# Patient Record
Sex: Female | Born: 1955
Health system: Southern US, Community
[De-identification: ages and names within clinical notes are randomized; demographics above are authoritative.]

## PROBLEM LIST (undated history)

## (undated) DIAGNOSIS — I219 Acute myocardial infarction, unspecified: Secondary | ICD-10-CM

## (undated) DIAGNOSIS — E785 Hyperlipidemia, unspecified: Secondary | ICD-10-CM

## (undated) DIAGNOSIS — Z9189 Other specified personal risk factors, not elsewhere classified: Secondary | ICD-10-CM

## (undated) DIAGNOSIS — I10 Essential (primary) hypertension: Secondary | ICD-10-CM

## (undated) DIAGNOSIS — B029 Zoster without complications: Secondary | ICD-10-CM

## (undated) DIAGNOSIS — I2510 Atherosclerotic heart disease of native coronary artery without angina pectoris: Secondary | ICD-10-CM

## (undated) DIAGNOSIS — I499 Cardiac arrhythmia, unspecified: Secondary | ICD-10-CM

## (undated) DIAGNOSIS — E079 Disorder of thyroid, unspecified: Secondary | ICD-10-CM

## (undated) DIAGNOSIS — M1990 Unspecified osteoarthritis, unspecified site: Secondary | ICD-10-CM

## (undated) HISTORY — PX: THYROID SURGERY: SHX805

## (undated) HISTORY — PX: BACK SURGERY: SHX140

## (undated) HISTORY — DX: Other specified personal risk factors, not elsewhere classified: Z91.89

## (undated) HISTORY — DX: Hyperlipidemia, unspecified: E78.5

## (undated) HISTORY — PX: TUBAL LIGATION: SHX77

## (undated) HISTORY — PX: CRYOTHERAPY: SHX1416

## (undated) HISTORY — DX: Atherosclerotic heart disease of native coronary artery without angina pectoris: I25.10

## (undated) HISTORY — DX: Zoster without complications: B02.9

## (undated) HISTORY — DX: Cardiac arrhythmia, unspecified: I49.9

---

## 1997-05-01 ENCOUNTER — Ambulatory Visit (HOSPITAL_COMMUNITY): Admission: RE | Admit: 1997-05-01 | Discharge: 1997-05-01 | Payer: Self-pay | Admitting: Neurosurgery

## 1997-05-11 ENCOUNTER — Inpatient Hospital Stay (HOSPITAL_COMMUNITY): Admission: RE | Admit: 1997-05-11 | Discharge: 1997-05-12 | Payer: Self-pay | Admitting: Neurosurgery

## 1997-05-31 ENCOUNTER — Ambulatory Visit (HOSPITAL_COMMUNITY): Admission: RE | Admit: 1997-05-31 | Discharge: 1997-05-31 | Payer: Self-pay | Admitting: Neurosurgery

## 1997-06-19 ENCOUNTER — Ambulatory Visit (HOSPITAL_COMMUNITY): Admission: RE | Admit: 1997-06-19 | Discharge: 1997-06-19 | Payer: Self-pay | Admitting: Neurosurgery

## 1997-06-19 ENCOUNTER — Emergency Department (HOSPITAL_COMMUNITY): Admission: EM | Admit: 1997-06-19 | Discharge: 1997-06-19 | Payer: Self-pay | Admitting: Emergency Medicine

## 1997-06-20 ENCOUNTER — Inpatient Hospital Stay (HOSPITAL_COMMUNITY): Admission: RE | Admit: 1997-06-20 | Discharge: 1997-06-21 | Payer: Self-pay | Admitting: Neurosurgery

## 1997-07-25 ENCOUNTER — Encounter: Admission: RE | Admit: 1997-07-25 | Discharge: 1997-10-23 | Payer: Self-pay | Admitting: Neurosurgery

## 1997-10-17 ENCOUNTER — Ambulatory Visit (HOSPITAL_COMMUNITY): Admission: RE | Admit: 1997-10-17 | Discharge: 1997-10-17 | Payer: Self-pay | Admitting: Rehabilitation

## 1997-11-02 ENCOUNTER — Ambulatory Visit (HOSPITAL_COMMUNITY): Admission: RE | Admit: 1997-11-02 | Discharge: 1997-11-02 | Payer: Self-pay | Admitting: Cardiology

## 1997-11-14 ENCOUNTER — Encounter (HOSPITAL_COMMUNITY): Admission: RE | Admit: 1997-11-14 | Discharge: 1998-02-12 | Payer: Self-pay | Admitting: Cardiology

## 1998-07-12 ENCOUNTER — Ambulatory Visit (HOSPITAL_COMMUNITY): Admission: RE | Admit: 1998-07-12 | Discharge: 1998-07-12 | Payer: Self-pay | Admitting: Cardiology

## 1998-07-16 ENCOUNTER — Encounter: Payer: Self-pay | Admitting: Cardiology

## 1998-07-16 ENCOUNTER — Inpatient Hospital Stay (HOSPITAL_COMMUNITY): Admission: EM | Admit: 1998-07-16 | Discharge: 1998-07-18 | Payer: Self-pay | Admitting: Emergency Medicine

## 1998-07-17 HISTORY — PX: CORONARY ANGIOPLASTY WITH STENT PLACEMENT: SHX49

## 1998-09-04 ENCOUNTER — Ambulatory Visit (HOSPITAL_COMMUNITY): Admission: RE | Admit: 1998-09-04 | Discharge: 1998-09-04 | Payer: Self-pay | Admitting: Cardiology

## 1998-10-11 ENCOUNTER — Ambulatory Visit (HOSPITAL_COMMUNITY): Admission: RE | Admit: 1998-10-11 | Discharge: 1998-10-11 | Payer: Self-pay | Admitting: Cardiology

## 1998-10-11 ENCOUNTER — Encounter: Payer: Self-pay | Admitting: Cardiology

## 1999-01-05 ENCOUNTER — Encounter: Payer: Self-pay | Admitting: Neurosurgery

## 1999-01-05 ENCOUNTER — Ambulatory Visit (HOSPITAL_COMMUNITY): Admission: RE | Admit: 1999-01-05 | Discharge: 1999-01-05 | Payer: Self-pay | Admitting: Neurosurgery

## 1999-02-12 ENCOUNTER — Encounter: Payer: Self-pay | Admitting: Cardiology

## 1999-02-12 ENCOUNTER — Ambulatory Visit (HOSPITAL_COMMUNITY): Admission: RE | Admit: 1999-02-12 | Discharge: 1999-02-12 | Payer: Self-pay | Admitting: Cardiology

## 1999-03-14 ENCOUNTER — Ambulatory Visit (HOSPITAL_COMMUNITY): Admission: RE | Admit: 1999-03-14 | Discharge: 1999-03-14 | Payer: Self-pay | Admitting: Rehabilitation

## 1999-03-17 ENCOUNTER — Encounter: Payer: Self-pay | Admitting: Cardiology

## 1999-03-21 ENCOUNTER — Encounter: Admission: RE | Admit: 1999-03-21 | Discharge: 1999-03-21 | Payer: Self-pay | Admitting: Neurosurgery

## 1999-03-21 ENCOUNTER — Encounter: Payer: Self-pay | Admitting: Neurosurgery

## 1999-04-12 ENCOUNTER — Emergency Department (HOSPITAL_COMMUNITY): Admission: EM | Admit: 1999-04-12 | Discharge: 1999-04-12 | Payer: Self-pay | Admitting: Emergency Medicine

## 1999-09-19 ENCOUNTER — Other Ambulatory Visit: Admission: RE | Admit: 1999-09-19 | Discharge: 1999-09-19 | Payer: Self-pay | Admitting: Obstetrics and Gynecology

## 2000-05-20 ENCOUNTER — Encounter: Admission: RE | Admit: 2000-05-20 | Discharge: 2000-05-20 | Payer: Self-pay | Admitting: Neurosurgery

## 2000-05-20 ENCOUNTER — Encounter: Payer: Self-pay | Admitting: Neurosurgery

## 2000-06-02 ENCOUNTER — Encounter: Admission: RE | Admit: 2000-06-02 | Discharge: 2000-07-01 | Payer: Self-pay | Admitting: Orthopaedic Surgery

## 2000-10-15 ENCOUNTER — Emergency Department (HOSPITAL_COMMUNITY): Admission: EM | Admit: 2000-10-15 | Discharge: 2000-10-15 | Payer: Self-pay | Admitting: Emergency Medicine

## 2000-10-15 ENCOUNTER — Encounter: Payer: Self-pay | Admitting: Emergency Medicine

## 2000-10-27 ENCOUNTER — Encounter: Admission: RE | Admit: 2000-10-27 | Discharge: 2001-01-25 | Payer: Self-pay | Admitting: Occupational Medicine

## 2000-11-26 ENCOUNTER — Encounter: Payer: Self-pay | Admitting: Cardiology

## 2000-11-26 ENCOUNTER — Ambulatory Visit (HOSPITAL_COMMUNITY): Admission: RE | Admit: 2000-11-26 | Discharge: 2000-11-26 | Payer: Self-pay | Admitting: Cardiology

## 2001-05-06 ENCOUNTER — Other Ambulatory Visit: Admission: RE | Admit: 2001-05-06 | Discharge: 2001-05-06 | Payer: Self-pay | Admitting: Obstetrics and Gynecology

## 2001-07-21 ENCOUNTER — Encounter: Payer: Self-pay | Admitting: Cardiology

## 2001-07-21 ENCOUNTER — Ambulatory Visit (HOSPITAL_COMMUNITY): Admission: RE | Admit: 2001-07-21 | Discharge: 2001-07-21 | Payer: Self-pay | Admitting: Cardiology

## 2002-01-12 ENCOUNTER — Encounter: Payer: Self-pay | Admitting: Cardiology

## 2002-01-12 ENCOUNTER — Ambulatory Visit (HOSPITAL_COMMUNITY): Admission: RE | Admit: 2002-01-12 | Discharge: 2002-01-12 | Payer: Self-pay | Admitting: Cardiology

## 2002-01-12 HISTORY — PX: CARDIAC CATHETERIZATION: SHX172

## 2003-04-12 ENCOUNTER — Ambulatory Visit (HOSPITAL_COMMUNITY): Admission: RE | Admit: 2003-04-12 | Discharge: 2003-04-12 | Payer: Self-pay | Admitting: Family Medicine

## 2003-07-26 ENCOUNTER — Encounter (INDEPENDENT_AMBULATORY_CARE_PROVIDER_SITE_OTHER): Payer: Self-pay | Admitting: Cardiology

## 2003-07-26 ENCOUNTER — Ambulatory Visit (HOSPITAL_COMMUNITY): Admission: RE | Admit: 2003-07-26 | Discharge: 2003-07-26 | Payer: Self-pay | Admitting: Cardiology

## 2005-05-18 ENCOUNTER — Encounter (HOSPITAL_COMMUNITY): Admission: RE | Admit: 2005-05-18 | Discharge: 2005-08-16 | Payer: Self-pay | Admitting: Cardiology

## 2006-01-07 ENCOUNTER — Inpatient Hospital Stay (HOSPITAL_COMMUNITY): Admission: EM | Admit: 2006-01-07 | Discharge: 2006-01-10 | Payer: Self-pay | Admitting: Emergency Medicine

## 2006-01-07 HISTORY — PX: CORONARY ANGIOPLASTY WITH STENT PLACEMENT: SHX49

## 2006-01-26 ENCOUNTER — Encounter: Admission: RE | Admit: 2006-01-26 | Discharge: 2006-04-26 | Payer: Self-pay | Admitting: Cardiology

## 2006-03-08 ENCOUNTER — Encounter (HOSPITAL_COMMUNITY): Admission: RE | Admit: 2006-03-08 | Discharge: 2006-06-06 | Payer: Self-pay | Admitting: Cardiology

## 2006-03-12 ENCOUNTER — Ambulatory Visit (HOSPITAL_COMMUNITY): Admission: RE | Admit: 2006-03-12 | Discharge: 2006-03-12 | Payer: Self-pay | Admitting: Cardiology

## 2006-03-18 ENCOUNTER — Ambulatory Visit (HOSPITAL_COMMUNITY): Admission: RE | Admit: 2006-03-18 | Discharge: 2006-03-19 | Payer: Self-pay | Admitting: Cardiology

## 2006-03-18 HISTORY — PX: CORONARY ANGIOPLASTY WITH STENT PLACEMENT: SHX49

## 2006-09-02 ENCOUNTER — Inpatient Hospital Stay (HOSPITAL_COMMUNITY): Admission: EM | Admit: 2006-09-02 | Discharge: 2006-09-03 | Payer: Self-pay | Admitting: Cardiovascular Disease

## 2006-09-02 ENCOUNTER — Encounter: Payer: Self-pay | Admitting: Emergency Medicine

## 2006-09-03 HISTORY — PX: CARDIAC CATHETERIZATION: SHX172

## 2006-09-04 ENCOUNTER — Inpatient Hospital Stay (HOSPITAL_COMMUNITY): Admission: EM | Admit: 2006-09-04 | Discharge: 2006-09-06 | Payer: Self-pay | Admitting: Emergency Medicine

## 2006-09-05 ENCOUNTER — Ambulatory Visit: Payer: Self-pay | Admitting: Vascular Surgery

## 2006-09-05 ENCOUNTER — Encounter (INDEPENDENT_AMBULATORY_CARE_PROVIDER_SITE_OTHER): Payer: Self-pay | Admitting: Cardiology

## 2006-09-09 ENCOUNTER — Ambulatory Visit (HOSPITAL_COMMUNITY): Admission: RE | Admit: 2006-09-09 | Discharge: 2006-09-09 | Payer: Self-pay | Admitting: Cardiology

## 2007-02-16 ENCOUNTER — Emergency Department (HOSPITAL_COMMUNITY): Admission: EM | Admit: 2007-02-16 | Discharge: 2007-02-16 | Payer: Self-pay | Admitting: Family Medicine

## 2008-03-02 ENCOUNTER — Ambulatory Visit (HOSPITAL_COMMUNITY): Admission: RE | Admit: 2008-03-02 | Discharge: 2008-03-02 | Payer: Self-pay | Admitting: Cardiology

## 2008-03-14 ENCOUNTER — Ambulatory Visit (HOSPITAL_COMMUNITY): Admission: RE | Admit: 2008-03-14 | Discharge: 2008-03-14 | Payer: Self-pay | Admitting: Cardiology

## 2009-12-27 ENCOUNTER — Emergency Department (HOSPITAL_COMMUNITY)
Admission: EM | Admit: 2009-12-27 | Discharge: 2009-12-27 | Payer: Self-pay | Source: Home / Self Care | Admitting: Family Medicine

## 2010-03-20 ENCOUNTER — Other Ambulatory Visit (HOSPITAL_COMMUNITY): Payer: Self-pay | Admitting: Endocrinology

## 2010-03-20 DIAGNOSIS — E0590 Thyrotoxicosis, unspecified without thyrotoxic crisis or storm: Secondary | ICD-10-CM

## 2010-04-07 ENCOUNTER — Ambulatory Visit (HOSPITAL_COMMUNITY): Payer: Self-pay

## 2010-04-08 ENCOUNTER — Other Ambulatory Visit (HOSPITAL_COMMUNITY): Payer: Self-pay

## 2010-04-14 ENCOUNTER — Encounter (HOSPITAL_COMMUNITY)
Admission: RE | Admit: 2010-04-14 | Discharge: 2010-04-14 | Disposition: A | Discharge status: Home / Self Care | Payer: 59 | Source: Ambulatory Visit | Attending: Endocrinology | Admitting: Endocrinology

## 2010-04-14 DIAGNOSIS — E0590 Thyrotoxicosis, unspecified without thyrotoxic crisis or storm: Secondary | ICD-10-CM | POA: Insufficient documentation

## 2010-04-15 ENCOUNTER — Encounter (HOSPITAL_COMMUNITY)
Admission: RE | Admit: 2010-04-15 | Discharge: 2010-04-15 | Disposition: A | Discharge status: Home / Self Care | Payer: Commercial Managed Care - PPO | Source: Ambulatory Visit | Attending: Endocrinology | Admitting: Endocrinology

## 2010-04-15 MED ORDER — SODIUM PERTECHNETATE TC 99M INJECTION
10.0000 | Freq: Once | INTRAVENOUS | Status: AC | PRN
  Administered 2010-04-15: 10 via INTRAVENOUS

## 2010-04-15 MED ORDER — SODIUM IODIDE I 131 CAPSULE
9.9000 | Freq: Once | INTRAVENOUS | Status: AC | PRN
  Administered 2010-04-15: 9.9 via ORAL

## 2010-04-22 LAB — BLOOD GAS, ARTERIAL
Acid-Base Excess: 0.5 mmol/L (ref 0.0–2.0)
Drawn by: 242311
FIO2: 0.21 %
TCO2: 25.4 mmol/L (ref 0–100)
pH, Arterial: 7.438 — ABNORMAL HIGH (ref 7.350–7.400)
pO2, Arterial: 65.7 mmHg — ABNORMAL LOW (ref 80.0–100.0)

## 2010-04-28 ENCOUNTER — Other Ambulatory Visit (HOSPITAL_COMMUNITY): Payer: Self-pay | Admitting: Endocrinology

## 2010-04-28 DIAGNOSIS — E0590 Thyrotoxicosis, unspecified without thyrotoxic crisis or storm: Secondary | ICD-10-CM

## 2010-05-09 ENCOUNTER — Encounter (HOSPITAL_COMMUNITY)
Admission: RE | Admit: 2010-05-09 | Discharge: 2010-05-09 | Disposition: A | Discharge status: Home / Self Care | Payer: Commercial Managed Care - PPO | Source: Ambulatory Visit | Attending: Endocrinology | Admitting: Endocrinology

## 2010-05-09 DIAGNOSIS — E0590 Thyrotoxicosis, unspecified without thyrotoxic crisis or storm: Secondary | ICD-10-CM | POA: Insufficient documentation

## 2010-05-09 MED ORDER — SODIUM IODIDE I 131 CAPSULE
28.9000 | Freq: Once | INTRAVENOUS | Status: AC | PRN
  Administered 2010-05-09: 28.9 via ORAL

## 2010-05-20 NOTE — Cardiovascular Report (Signed)
Shelley [name], Shelley [name]:  [id number]   MEDICAL RECORD NO.:  [id number]          PATIENT TYPE:  INP   LOCATION:  6525                         FACILITY:  [facility]   PHYSICIAN:  Madaline Savage, M.D.DATE OF BIRTH:  05/19/1955   DATE OF PROCEDURE:  09/03/2006  DATE OF DISCHARGE:                            CARDIAC CATHETERIZATION   PROCEDURES PERFORMED:  1. Selective coronary angiography by Judkins technique.  2. Retrograde left heart catheterization.  3. Left ventricular angiography.   COMPLICATIONS:  None.   ENTRY SITE:  Right femoral.   DYE USED:  Omnipaque.   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  Prior to hospital entry, she  had some substernal discomfort that was similar to previous chest pains  that she has had and she felt that the severity of the pain was worthy  of going to [facility] Emergency Room.  There she was  transferred to [facility] and this catheterization was performed.   By history, she has had LAD stenting mid.  She has also had some  proximal and mid RCA overlapped stents placed, one by Dr. Peter Swaziland,  and a second one by me.  Today, the patient's cardiac cath was completed  without any complications.   RESULTS:  The left ventricular pressure and central aortic pressure were  normal.  There was no aortic valve gradient by pullback technique.   ANGIOGRAPHIC RESULTS:  The patient has two LAD stents which appear to be  non-overlapped, but very closely adjacent.  The first stent is after  diagonal branch-1, after septal perforator branch-1, but before septal  perforator branch-2.  The second stent is just after septal perforator-  2.  LAD looks pristine throughout including the widely patent stents.  Diagonal is also normal in appearance.  Circumflex bifurcates distally  and is  normal.  Left main coronary artery is normal.   Right coronary artery is large and dominant giving rise to a large  posterior descending branch and a bifurcating posterolateral branch.  There are two overlapped stents in the RCA and the appearance of the  stents there are pristine.  There is a medium to large pulmonary conus  branch of one acute marginal branch.  No lesions seen.   Left ventricular angiography shows vigorous contractility of all wall  segments.  Ejection fraction estimate 60%.  No wall motion  abnormalities.  No LV thrombus.  No mitral regurgitation.   IMPRESSION:  1. Doing well status post coronary artery stenting of left anterior      descending and right coronary artery.  The last intervention was      March 18, 2006.  2. Normal left ventricular systolic function.   PLAN:  The patient is likely having gastrointestinal pain and proton  pump inhibitors are recommended.  She should follow up with the  excellent care Dr. Renard Matter and she should see Dr. Elsie Lincoln either in the  [facility] or [place] office at next available appointment.  ______________________________  Madaline Savage, M.D.     WHG/MEDQ  D:  09/03/2006  T:  09/04/2006  Job:  [id number]   cc:   Angus G. Renard Matter, MD  [facility]

## 2010-05-20 NOTE — Discharge Summary (Signed)
NAMEALVIN, [name]:  [id number]   MEDICAL RECORD NO.:  [id number]          PATIENT TYPE:  INP   LOCATION:  2008                         FACILITY:  [facility]   PHYSICIAN:  Ritta Slot, MD     DATE OF BIRTH:  03/13/1955   DATE OF ADMISSION:  09/04/2006  DATE OF DISCHARGE:  09/06/2006                               DISCHARGE SUMMARY   HOSPITAL COURSE:  [name] is a 55 year old African American  female patient of Dr. Lavonne Chick, who underwent cardiac catheterization  on September 03, 2006.  She had patent coronary arteries at that time.  She  came into the hospital because of a very large hematoma at the groin  site.  She was unable to bear weight or walk.  She was seen by Dr.  Lynnea Ferrier in the emergency room, and then admitted for further  observation.  The following day, she had an ultrasound of her groin  done.  She did have a pseudoaneurysm.  No evidence of AV fistula.  Because of the weekend and the holiday, it will be unable to be  compressed until September 07, 2006.   On September 06, 2006, she was seen by Dr. Allyson Sabal.  Her blood pressure was  111/65.  Her pulse was 74.  The groin area was less tender.  It was felt  that she could go home.  She would have a groin duplex done in our  office in approximately a week's time.  If it is still open at that  time, it will be considered for a thrombin injection by Dr. Yates Decamp.  The patient was agreeable with this.   LABORATORY DATA:  Sodium 138, potassium 3.7, glucose 175, BUN 11,  creatinine 0.81.  Hemoglobin of 14.2, hematocrit 41.8, WBCs 9.3,  platelets 286.   DISCHARGE MEDICATIONS:  1. Glucophage 500 mg.  2. Plavix 75 mg every day.  3. Aspirin 81 mg every day.  4. Lotensin 10 mg every day.  5. Crestor 10 mg every day.  6. Potassium chloride 20 mEq every day.  7. Bystolic 5 mg every day.  8. She was given a prescription for Percocet 5/325.  She can take 1-2      every 6 hours when needed for pain.   DISCHARGE DIAGNOSES:  1. Groin pseudoaneurysm.  2. Coronary artery disease, recent catheterization, September 03, 2006,      at which time she had patent coronary arteries.  She has a history      of stent to her mid left anterior descending, and a Taxusstent to a      proximal right coronary artery lesion in January 2008, and she also      had a Cypherstent to a distal left anterior descending lesion in      March 2008, and a Cypherstent to a mid right coronary artery lesion      in March 2008.  3. Diabetes mellitus.  4. Hypertension.  5. Dyslipidemia.  6. Normal ejection fraction.      [name], N.P.      Ritta Slot, MD  Electronically Signed    BB/MEDQ  D:  09/06/2006  T:  09/06/2006  Job:  [id number]

## 2010-05-20 NOTE — Discharge Summary (Signed)
Shelley [name], Shelley [name]:  [id number]   MEDICAL RECORD NO.:  [id number]          PATIENT TYPE:  INP   LOCATION:  6525                         FACILITY:  [facility]   PHYSICIAN:  Nicki Guadalajara, M.D.     DATE OF BIRTH:  10-06-1955   DATE OF ADMISSION:  09/02/2006  DATE OF DISCHARGE:  09/03/2006                               DISCHARGE SUMMARY   DISCHARGE DIAGNOSES:  1. Chest pain, negative myocardial infarction, patent coronary      arteries and patent stents, most likely gastrointestinal.  2. Diabetes mellitus 2, controlled.  3. Hypertension, controlled.  4. Dyslipidemia.  5. Known known coronary disease with history of myocardial infarction      in January, 2008.  Has history of stents to the left anterior      descending x2 and to the right circumflex artery x2.  Ejection      fraction is normal.   DISCHARGE CONDITION:  Improved.   PROCEDURES:  Combined left heart cath September 03, 2006, by Dr. Madaline Savage.   DISCHARGE MEDICATIONS:  1. Hold Glucophage 500 mg daily until Sunday evening.  2. Plavix 75 mg daily.  3. Aspirin 81 mg daily.  4. Lotensin 10 mg daily.  5. Crestor 10 mg daily.  6. Potassium chloride 20 mEq daily.  7. Bystolic 5 mg daily.  8. Nitroglycerin 1/150 under your tongue as needed for chest pain.  9. Prevacid 30 mg daily; if unable to have Prevacid through [employer], just Prilosec over the counter 1 a day.   DISCHARGE INSTRUCTIONS:  1. Increase activity slowly.  2. No lifting for 2 days.  3. No driving for 2 days.  4. May return to work September 07, 2006, if no problems.  5. Low-sodium, heart-healthy diabetic diet.  6. Wash right groin cath site with soap and water.  Call if any      bleeding, swelling or drainage.  7. Follow up with Dr. Elsie Lincoln on September 14, 2006, at 8:45 a.m.   HISTORY OF PRESENT ILLNESS:  Female transferred from [facility] with  chest pain, has history of coronary disease with intervention to  the RCA  and LAD.  Previously RCA, and I think recently LAD.  But she has had an  MI in January, 2008.   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  She did well during  the day working, went home, was at rest and began to experience fullness  in the midsternal chest region with a sensation of indigestion.  She was  short of breath and nauseated, but no vomiting or diaphoresis.  The pain  did radiate to her left arm with aching.  She denies increased fatigue  and she has been doing well prior to this.  EKG was stable and she was  pain free when she was at rest in the recovery room.  Other history  includes hypertension, dyslipidemia, diabetes.   FAMILY HISTORY/SOCIAL HISTORY/REVIEW OF SYSTEMS/MEDICATIONS:  See H&P.   PHYSICAL EXAMINATION AT DISCHARGE:  GENERAL:  Blood pressure 132/82,  pulse 60, respirations 20, temperature  97.7.  CHEST:  Clear.  HEART:  Regular rate and rhythm.  RIGHT GROIN CATH SITE:  Stable.  No hematoma.  Pedal pulses are present.   LABORATORY DATA:  Cholesterol 126, triglycerides 97, HDL 32 and LDL 75.  Cardiac enzymes:  CK was up initially at 194, but MBs were negative at  3.6.  Troponin-I is 0.01.   Sodium 141, potassium 3.8, chloride 105, CO2 30, glucose 99, BUN 9,  creatinine 0.82.  Calcium 8.9, total protein 6.5, albumin 3.6, SGOT 23,  SGPT 20, alkaline phosphatase 89, total bilirubin 0.6, magnesium 2.0,  INR of 1, PTT 40 with heparin.  HbA1C was 6.3.  EKG is sinus rhythm.  On  admission, sinus bradycardia, T-wave abnormality laterally and the same  wave as the 29th.  A similar EKG on April 18, 2006.   HOSPITAL COURSE:  The patient was admitted to Dr. Tresa Endo secondary to  chest pain.  She was put on IV heparin, IV nitroglycerin.  The morning  of August 29, she underwent cardiac catheterization that revealed patent  coronaries and patent stents, 4 previous stents.  She was found to be  stable, no more pain; felt to be GI.  We put her on Prevacid,  and if her  insurance company will not fill that, she can use Prilosec over the  counter.  She was transferred to 6500 for observation until she can  recover from the cardiac cath.  She was stable at discharge and will  follow up with Dr. Elsie Lincoln.      Darcella Gasman. [name], N.P.    ______________________________  Nicki Guadalajara, M.D.    LRI/MEDQ  D:  09/03/2006  T:  09/04/2006  Job:  [id number]   cc:   Madaline Savage, M.D.  Dr. Megan Mans

## 2010-05-20 NOTE — H&P (Signed)
NAMEINEZ, STANTZ NO.:  [id number]   MEDICAL RECORD NO.:  [id number]          PATIENT TYPE:  INP   LOCATION:  2008                         FACILITY:  [facility]   PHYSICIAN:  Ritta Slot, MD     DATE OF BIRTH:  03-11-1955   DATE OF ADMISSION:  09/04/2006  DATE OF DISCHARGE:                              HISTORY & PHYSICAL   Ms. Rhoda Waldvogel is a 55 year old lady well known to my partner,  Lavonne Chick, who is status post cardiac catheterization September 03, 2006  which revealed patent coronary arteries.  She noticed increased swelling  and pain of right groin today and came to the emergency room.  She was  found by myself to have a very large tender hematoma at the calf site;  she is not able to weight bear or walk.  She is currently taking aspirin  and Plavix, and she has had three drug-eluting stents inserted in the  past.  She denies any other complaints of chest pain or shortness of  breath.   PAST MEDICAL HISTORY:  1. Stent to her mid-LAD in 2000.  2. Taxus stent to a proximal RCA lesion of 3.0 x 24 mm in January of      2008.  3. CYPHER stent to a distal LAD lesion March of 2008; CYPHER stent to      a mid-RCA lesion March of 2008.  4. Diabetes.  5. Hypertension.  6. Dyslipidemia.  7. Normal EF.   CURRENT MEDICATIONS:  Include:  1. Plavix 75 mg daily.  2. Aspirin 81 mg daily.  3. Lotensin 10 mg daily.  4. Crestor 10 mg daily.  5. Potassium chloride 20 mEq.  6. Bystolic 5 mg daily.  7. Prevacid 30 mg daily.   She has no known drug allergies.   SOCIAL HISTORY:  She works as an Scientist, physiological at [employer].  She  is an ex-smoker, quit January of 2008.  She does not drink alcohol.  She  lives with her husband and son.   FAMILY HISTORY:  Noncontributory.   REVIEW OF SYSTEMS:  Otherwise unremarkable.   PHYSICAL EXAMINATION:  GENERAL:  She is a well-looking individual in no  acute distress.  VITAL SIGNS:  Blood pressure is 109/72, heart  rate 72 sinus rhythm,  respiratory rate 16.  EXAMINATION OF CARDIOVASCULAR SYSTEM:  Heart sounds 1 and 2 are heard.  No murmurs, heaves or thrills noted.  LUNGS:  Lung fields clear to auscultation bilaterally.  ABDOMEN:  Soft, nontender.  No hepatosplenomegaly.  EXTREMITIES:  Pedal pulses were present 2+ bilaterally and there is no  evidence of vascular compromise to her lower extremity.  There was a  large huge hematoma in her groin that was tender and bruised.   IMPRESSION:  A pleasant 55 year old lady with significant groin hematoma  status post cardiac catheterization.   PLAN:  Will perform an abdomen and pelvis CT without contrast and CT of  the right groin looking specifically for retroperitoneal bleed. Will  also perform a vascular ultrasound scan with Dopplers to the right groin  in the morning to exclude AV fistula +/-  aneurysm.  I would recommend  compression to her right groin, elevate the right leg and a CBC and B-  Met is pending.   Many thanks.      Ritta Slot, MD  Electronically Signed     HS/MEDQ  D:  09/04/2006  T:  09/05/2006  Job:  [id number]

## 2010-05-23 NOTE — H&P (Signed)
NAMENASIRA, JANUSZ NO.:  [id number]   MEDICAL RECORD NO.:  [id number]          PATIENT TYPE:  INP   LOCATION:  2903                         FACILITY:  [facility]   PHYSICIAN:  Peter M. Swaziland, M.D.  DATE OF BIRTH:  September 30, 1955   DATE OF ADMISSION:  01/07/2006  DATE OF DISCHARGE:                              HISTORY & PHYSICAL   HISTORY OF PRESENT ILLNESS:  Ms. Pint is a 55 year old black  female who presents with an acute onset of perfuse sweating at 8 p.m.  tonight while bowling.  She then developed 6/10 chest pain across her  anterior precordium, associated with nausea.  She felt lightheaded and  weak.  EMS was called and initial ECG showed fairly marked ST-elevation  inferiorly at 4 mm reciprocal ST-depression in leads 1 and AVL.  She has  ongoing chest pain on arrival to the emergency room.  The patient does  have a history of coronary disease and is status post stenting of the  proximal LAD in 2000.  She has a history of hypertension,  hypercholesterolemia and tobacco abuse.   PAST MEDICAL HISTORY:  1. Prior stenting of the left anterior descending in 2000.  2. Hypertension.  3. Hypercholesterolemia.  4. Prior back surgery.   ALLERGIES:  SHE HAS BEEN INTOLERANT TO NORVASC DUE TO TACHYCARDIA.   MEDICATIONS:  Include Maxzide, Tiazac and aspirin.  She has previously  been on Vytorin, but states she is no longer taking this.   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  She is a smoker.  She is married.   FAMILY HISTORY:  Unobtainable at the time.   PHYSICAL EXAMINATION:  GENERAL:  Patient is a well-developed black  female in moderate distress.  VITAL SIGNS:  Her blood pressure is 130/70.  Pulse 65.  Respirations are  normal.  HEENT:  Exam is unremarkable.  She is normocephalic, atraumatic.  Pupils  are equal, round and reactive.  Sclerae are clear.  Oropharynx is clear.  She has no JVD or bruits.  LUNGS:   Clear to auscultation and percussion.  CARDIAC EXAM:  Reveals a regular rate and rhythm with a positive S4.  There is no S3 or murmur.  ABDOMEN:  Soft and nontender without masses or bruits.  EXTREMITIES:  Femoral pedal pulses are 2+ and symmetric.  She has no  edema.  NEUROLOGIC EXAM:  Intact.   LABORATORY DATA:  Still pending.   IMPRESSION:  1. Acute inferior myocardial infarction.  2. Prior stenting of the left anterior descending.  3. Hypertension.  4. Hypercholesterolemia.  5. Tobacco abuse.   PLAN:  Patient is loaded with aspirin.  She will be taken for emergent  cardiac catheterization and coronary intervention.  Further plans  pending these results.           ______________________________  Peter M. Swaziland, M.D.     PMJ/MEDQ  D:  01/07/2006  T:  01/08/2006  Job:  [id number]   cc:   Madaline Savage, M.D.

## 2010-05-23 NOTE — Discharge Summary (Signed)
NAMESHARDEA, [name]:  Shelley [name]   MEDICAL RECORD NO.:  Shelley [name]          PATIENT TYPE:  INP   LOCATION:  Shelley [name]                         FACILITY:  Shelley [name]   PHYSICIAN:  Shelley [name], M.D.DATE OF BIRTH:  [birth year]   DATE OF ADMISSION:  01/07/2006  DATE OF DISCHARGE:  01/10/2006                               DISCHARGE SUMMARY   DISCHARGE DIAGNOSES:  1. Acute inferior myocardial infarction.  2. Coronary artery disease with an emergent cardiac catheterization      leading to rescue percutaneous transluminal coronary angiography      and stent of the right coronary artery with a drug-eluting Taxus      stent.  3. Nonsustained ventricular tachycardia during the catheterization      spontaneously terminated.  4. Previous history of left anterior descending stent in 2000.  5. Hypertension.  6. Hyperlipidemia.  7. Diabetes mellitus type 2 new diagnosis with a glycohemoglobin of      6.8.  Glucophage was added to her medical regimen.  8. Tobacco abuse.  Instructed to stop.  9. Hyperlipidemia.   DISCHARGE CONDITION:  Improved.   PROCEDURES:  Emergent cardiac catheterization January 07, 2006, by Dr.  Peter [name] with PTCA and stent deployment to the right coronary  proximally.  She had good LV function on that catheterization.   DISCHARGE MEDICATIONS:  1. Enteric-coated aspirin 81 mg daily.  2. Plavix 75 mg daily, do not stop.  3. Vytorin 10/80 one daily.  4. K-Dur 20 mEq daily.  5. Chantix pack, take as directed.  6. Metoprolol 50 mg 1 twice a day.  7. Lotensin/hydrochlorothiazide 10/12.5 one daily.  8. Glucophage 500 mg daily.  9. Nitroglycerin sublingual as needed for chest pain.  10.Stop Maxzide, Tiazac, and Toprol XL.   DISCHARGE INSTRUCTIONS:  1. No work until after seeing Dr. Elsie [name] in office.  2. Low-fat, low-salt diabetic diet.  3. No driving for two weeks.  4. No lifting for two weeks.  5. Increase activity slowly.  6. Follow up with  Dr. Elsie [name].  The office will call.  7. Follow up with Dr. Renard [name].  8. Have lab work done Wednesday and Thursday with Dr. Renard [name] send      prescription slip.  Dr. Renard [name] should manage her diabetes please      and ask him to set up a class to teach you how to use your Accu-      Chek machine.  Notify him if glycohemoglobin 6.8 with hypoglycemia.   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 on January 07, 2006.  She was bowling at the time and developed 6 out of 10 pain, anterior  precordial disease, and nausea.  EMS was called.  Initial EKG showed Q  waves, ST elevation inferiorly, ongoing chest pain presented to the ER.  She does have a prior history of stent to the LAD in 2000.   PAST MEDICAL HISTORY:  1. Coronary artery disease as stated.  2. Hypertension.  3. Hyperlipidemia.  4. Positive tobacco history.  5. Prior back surgery.   ALLERGIES:  INTOLERANT TO NORVASC WHICH HAS CAUSED TACHYCARDIA.   OUTPATIENT MEDICATIONS:  Maxzide, Tiazac, and aspirin.  Previously had  been on Vytorin but she had stopped.   SOCIAL HISTORY/FAMILY HISTORY/REVIEW OF SYSTEMS:  See history and  physical.   PHYSICAL EXAMINATION AT DISCHARGE:  VITAL SIGNS:  Blood pressure 100/63  to 134/88, pulse 63, respiratory 18, temperature 97.4, oxygen saturation  on room air 92%.  HEART:  Regular rate and rhythm.  LUNGS:  Clear.  ABDOMEN:  Soft, nontender, positive bowel sounds.  EXTREMITIES:  No edema.   LABORATORY DATA:  Hemoglobin 16.1, hematocrit 46.7, WBC 9.4, and  platelets 313; neutrophils 53, lymphocytes 33, monocytes 10, eosinophils  3, basophils 1, and these remained stable.  PT on admission 12.9, INR of  1, PTT 27.   Sodium 138, potassium 3.3, this was replaced, and at discharge he was  4.1, chloride CO2 27, glucose 187, BUN 11, creatinine  0.89.  At  discharge, glucose 115, that was on sliding scale.  Cardiac enzymes  ranged 195 with an MB of 4.9 to 16 with an MB of 16.8 and 197 with an MB  of 12.4.  Troponin-I was 0.03, 0.94, and 1.01.   Total cholesterol 248, HDL 29, LDL 178.  TSH 0.515.   EKGs:  Initial EKG with ST elevation in lead 3 and AVF and inverted T  waves in V3, V4, and V5.  Followup on the 4th, Q waves developed in lead  3, and deeper T waves in V3 and V4 and V5, and the ST elevation was gone  from V3 and aVF.   Chest x-ray:  Chronic basilar scarring.  It is not possible to rule out  coexisting acute process such as basilar pneumonia.  No heart failure.   Cardiac catheterization:  Left main was patent.  LAD stent proximal  okay, 70% mid stenosis in the mid vessel.  Circumflex single large, OM  no significant disease, RCA 100% proximal occluded with left-to-right  collaterals.  The patient then under PCI with Taxus stent placed to the  RCA by Dr. Peter [name] who was on call for Interventional Cardiology  for [place].   The patient tolerated the procedure.  She did have ventricular  tachycardia, but it resolved spontaneously.  Her potassium was low, and  that has been replaced as well.   HOSPITAL COURSE:  Ms.  Shelley [name] did very well after the initial  presentation once the vessel was opened.  She had no further complaints.  She improved daily and was able to ambulate without problems.  Her sugar  was elevated in the hospital.  We put her on glipizide since her  glycohemoglobin was elevated and discussed diabetic instructions to a  point.  She will follow up that with her primary care physician.  She  also underwent tobacco cessation and was placed on Chantix.  She  __________  cardiac rehabilitation, and by January 10, 2006, she was  ready for discharge home.  Was seen and discharged by Dr. Domingo Sep.      Shelley [name]. [name], N.P.   ______________________________  Shelley [name], M.D.    LRI/MEDQ   D:  03/09/2006  T:  03/10/2006  Job:  [id number]   cc:   Shelley [name], M.D.  Shelley M. [name], M.D.  Angus G. Shelley Matter, MD

## 2010-05-23 NOTE — H&P (Signed)
NAME:  Shelley [name], Shelley [name]:  [id number]   MEDICAL RECORD NO.:  [id number]                   PATIENT TYPE:  OIB   LOCATION:  2854                                 FACILITY:  [facility]   PHYSICIAN:  Madaline Savage, M.D.             DATE OF BIRTH:  1955/07/12   DATE OF ADMISSION:  01/12/2002  DATE OF DISCHARGE:                                HISTORY & PHYSICAL   CHIEF COMPLAINT:  Chest pain.   HISTORY OF PRESENT ILLNESS:  The patient is a 55 year old white female  followed by Dr. Madaline Savage and Dr. Ishmael Holter. Mc[place], with a history  of coronary disease.  She had a stent to the mid LAD in July of 2000.  Her  last office visit with Dr. Elsie Lincoln was in July of 2003.  She was set up for a  Cardiolite study, which showed a questionable area of anterior wall ischemia  versus breast attenuation, with an EF of 72%.  Recently, she has had some  recurrent chest pain.  She called the office last week.  She was put on  Imdur and Plavix by Dr. Cristy Hilts. Ganji and set up for a diagnostic  catheterization; she is admitted now for this.   PAST MEDICAL HISTORY:  Her past medical history is remarkable for  hypertension, hyperlipidemia and coronary disease as described above.   CURRENT MEDICATIONS:  1. Maxzide daily.  2. Celebrex 200 mg a day.  3. Aspirin 325 mg a day.  4. Plavix 75 mg a day.  5. Celexa 20 mg a day.  6. Lipitor 20 mg a day.  7. Imdur 30 mg a day.   She had been on Diovan in the past but is not taking this now.   ALLERGIES:  She has no known drug allergies.  She has a history of ACE  INHIBITOR intolerance because of cough.   SOCIAL HISTORY:  She is married and has one child.  She smokes a half a pack  a day.  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   FAMILY HISTORY:  Unremarkable.   PHYSICAL EXAMINATION:  VITAL SIGNS:  Blood pressure 137/82, pulse 68,  temperature 97.7.  GENERAL:  She is a well-developed, well-nourished African  American in no  acute distress.  HEENT:  Normocephalic.  Extraocular movements are intact.  Sclerae are  nonicteric.  Lids and conjunctivae are within normal limits.  NECK:  Neck is without JVD and without bruit.  CHEST:  Chest is clear to auscultation and percussion.  CARDIAC:  Exam reveals a regular rate and rhythm without murmur, rub or  gallop, normal S1 and S2.  ABDOMEN:  Abdomen is nontender.  No hepatosplenomegaly.  EXTREMITIES:  Extremities are without edema.  Distal pulses are intact.  NEUROLOGIC:  Exam is grossly intact.   LABORATORY DATA:  Labs done January 09, 2002 show sodium 138, potassium 4.1,  BUN 8, creatinine 0.8.  Her alkaline phosphatase is slightly elevated at 152  but liver functions otherwise are normal.  Urinalysis showed some leukocyte  esterase and a few bacteria.  White count was 8.5, hemoglobin 14.5,  hematocrit 42.4, platelets 279,000.  INR 1.1.   EKG reveals sinus rhythm, inferior T wave inversion and T wave inversion, V2  to V5.   IMPRESSION:  1. Chest pain consistent with angina.  2. Coronary disease with previous left anterior descending stenting in July     of 2000.  3. Treated hypertension.  4. Treated hyperlipidemia.  5. Moderate amount of leukocyte esterase, no symptoms of urinary tract     infection.   PLAN:  The patient will be set up for a diagnostic catheterization, which  will be done by Dr. Darlin Priestly, January 12, 2002.     Abelino Derrick, P.A.                      Madaline Savage, M.D.    Lenard Lance  D:  01/12/2002  T:  01/12/2002  Job:  [id number]

## 2010-05-23 NOTE — Cardiovascular Report (Signed)
NAMEKATESHA, EICHEL          ACCOUNT NO.:  [id number]   MEDICAL RECORD NO.:  [id number]          PATIENT TYPE:  OIB   LOCATION:  6525                         FACILITY:  [facility]   PHYSICIAN:  Madaline Savage, M.D.DATE OF BIRTH:  [birth year]   DATE OF PROCEDURE:  03/18/2006  DATE OF DISCHARGE:  03/19/2006                            CARDIAC CATHETERIZATION   PROCEDURES PERFORMED:  1. Selective coronary angiography by Judkins technique.  2. Retrograde left heart catheterization.  3. Left ventricular angiography.  4. Balloon angioplasty of the mid-left anterior descending coronary      artery followed by coronary artery stenting of the same area.  5. Intravascular ultrasound of the right coronary artery with then      direct stenting of the mid RCA distal to a previously placed      proximal RCA stent.   COMPLICATIONS:  None.   PATIENT PROFILE:  Laiba is a delightful, 55 year old, African  American, married female who is a [employer] employee.  She has  had previous LAD stenting in the year 2000, and had acute myocardial  infarction on January 07, 2006, at which time Dr. Peter Swaziland opened an  occluded proximal RCA and placed a TAXUS stent there 3.0 x 24.   The patient has had occasional episodes of arm pain that are not severe  but do concern her.  She was recently diagnosed with diabetes and is  treated for hyperlipidemia.  A stress test was performed on March 08, 2006, and showed a small but present area of reversible ischemia to the  anteroapical wall.  Because of this, we came to the cath lab today to  investigate the issues further.   RESULTS:   PRESSURES:  Left ventricular pressure was 120/5, end-diastolic pressure  2, central aortic pressure 120/70, mean of 90, no aortic valve gradient  by pullback technique.   ANGIOGRAPHIC RESULTS:  The patient's left main coronary artery was  normal.   The LAD coursed to the cardiac apex and gives rise to three small  to  medium size septal perforator branches.  There are at least two diagonal  branches present.  There is a radio-opaque stent overlying septal  perforator branch #1, and adjacent to first diagonal branch.  It was  deployed in the year 2000, and it would appear to be normal in terms of  its silhouette in two views.  There is a new area of interest in the  LAD, discovered in January of this year, when Dr. Swaziland was intervening  on her RCA.  This vessel has a tapered smooth stenosis of 75% visually,  and then the distal vessel becomes normal again.   The circumflex coronary artery is a large vessel, giving rise to one  large distal bifurcating obtuse marginal branch.  No significant lesions  are seen in this vessel.  There are some luminal irregularities in the  mid portion of the vessel.   RCA is a large and dominant vessel which is the dominant vessel of the  circulation.  There is a radio-opaque stent in the proximal portion of  the vessel, extending down to  the origin of the mid RCA and it is  pristine in appearance and has TIMI 3 flow.  There is irregular hazy  anatomy in the mid RCA just after the widely patent stent and just  before the acute angle of the heart.  The later IVUS performed showed a  reference vessel distally of close to 3.25 to 3.5-mm with very, very  mild plaquing.  The region of interest in the mid RCA showed eccentric  plaque at 5 o'clock that was very soft without calcification and  obstructive to only about 25 to 40% of luminal narrowing.  However, in  this soft plaque mid RCA showed an area of 75-80% stenosis in the left  upper quadrant of the IVUS picture, and it was therefore chosen to go  ahead and perform stenting.   The left ventricle showed normal contractility of all wall segments with  ejection fraction of 60%.   Percutaneous stenting on the mid-LAD was performed with a 2.5 x 13-mm  CYPHER stent which was post dilated with a 3-mm Quantum Maverick  balloon  and taken up to approximately 16 or 17 atmospheres of pressure with an  excellent and pristine result 75% to zero.   Likewise, a RCA stent was placed directly without pre dilatation into  the mid RCA just beyond the already patent proximal RCA stent already  indwelling which is a Taxus stent.  Balloon inflations were performed  and post dilated with a Quantum Maverick balloon with a pristine looking  result and preservation of TIMI 3 flow.   FINAL DIAGNOSES:  1. Two-vessel coronary artery disease as described above.      a.     Patent left anterior descending artery stent.      b.     A 75% de novo stenosis distal to septal perforator branch       number two, 75%.  2. Right coronary artery disease with patent stent in proximal right      coronary artery deployed January 05, 2006, and a new CYPHER stent      today 3.0 x 23, reducing a 75% stenosis to 0% residual.  Post      dilated with a Quantum Maverick balloon.  3. Normal left ventricular systolic function.   PLAN:  The patient will be observed.   It should be noted the patient also underwent right femoral artery hand  angiography and was thereafter, a recipient for an Angio-Seal closure  device which worked Agricultural consultant.           ______________________________  Madaline Savage, M.D.     WHG/MEDQ  D:  03/18/2006  T:  03/19/2006  Job:  [id number]   cc:   Cath Lab, [facility]

## 2010-05-23 NOTE — Cardiovascular Report (Signed)
NAMEDISHA, COTTAM NO.:  [id number]   MEDICAL RECORD NO.:  [id number]          PATIENT TYPE:  INP   LOCATION:  2807                         FACILITY:  [facility]   PHYSICIAN:  Peter M. Swaziland, M.D.  DATE OF BIRTH:  [birth year]   DATE OF PROCEDURE:  01/07/2006  DATE OF DISCHARGE:                            CARDIAC CATHETERIZATION   INDICATION FOR PROCEDURE:  Ms. Torti is a 55 year old black female  who has a known history of coronary disease, status post prior stenting  of the left anterior descending artery in 2000.  She presents now with  acute inferior myocardial infarction.  Her pain began approximately 40  minutes prior to arrival.  She is having ongoing chest pain and ST  elevation inferiorly.   PROCEDURES:  1. Left heart catheterization.  2. Coronary and left ventricular angiography.   EQUIPMENT USED:  The 6-French 4-cm right and left Judkins catheters, 6-  French pigtail catheter, 6-French arterial sheath, 6-French FR-4 guide,  0.014 high-torque floppy wire, a 2.5 x 15-mm Maverick balloon, a 3.0 x  24-mm Taxus stent and a 3.25 x 20-mm Quantum balloon.   CONTRAST:  165 mL of Omnipaque.   MEDICATIONS:  Heparin 4400 units IV with subsequent ACT of 310,  Integrilin bolus at 180 mcg/kg followed by a continuous infusion at 2.0  mcg/kg per minute, Zofran 4 mg IV, Plavix 300 mg p.o., Pepcid 20 mg IV.  Actually, the patient did receive a double bolus of Integrilin at 180  mcg/kg.   HEMODYNAMIC DATA:  Aortic pressure was 139/83 with a mean of 108.  Left  ventricle pressure was 135 with EDP of 28.  The patient is a sinus  rhythm.   ANGIOGRAPHIC DATA:  The left coronary artery arises and distributes  normally.  The left main coronary appears normal.   The left anterior descending artery demonstrates a stent in the proximal  vessel.  This is widely patent.  She then has a focal 70% stenosis in  the mid LAD following the first diagonal branch.  The  remainder of the  LAD is without significant disease.   The left circumflex coronary artery is a large vessel which gives rise  to a single large obtuse marginal vessel.  There are minimal  irregularities proximally.  This is less than 10%.   The right coronary artery is a dominant vessel and is occluded  proximally.  There are left-to-right collaterals to the distal right  coronary artery.   We proceeded at this point with acute intervention of the right coronary  artery occlusion.  After initial anticoagulation, the lesion was crossed  easily with a wire with reperfusion.  We then performed 2 balloon  inflations using the 2.59-mm Maverick up to 8 atmospheres x2.  This  resulted in improve reperfusion.  At this time, the patient did develop  bradycardia and hypotension and developed a nonsustained run of  ventricular tachycardia that broke spontaneously.  Subsequently, the  patient responded to IV fluids and subsequently remained hemodynamically  stable throughout procedure.  We next followed with stenting of the  proximal right coronary using a 3.0 x 24-mm  Taxus stent; this was  deployed at 9 atmospheres, then postdilated to 14 atmospheres.  We then  postdilated the entire stent using a 3.25 x 20-mm Quantum Maverick  balloon dilating to 12 atmospheres x2.  This yielded an excellent  angiographic result with 0% residual stenosis and TIMI grade 3 flow.  There was mild disease in the mid right coronary up to 20% to 30%.  At  this point, the patient had grade 1-2/10 chest pain and ST segment  elevation had resolved.   We then proceeded with the left ventricular angiography.  This was  performed in the RAO view.  This demonstrates normal left ventricular  size with minimal inferior wall hypokinesia and overall ejection  fraction of 55%.   At the end of procedure, the patients sheath was removed and the femoral  puncture site was closed using an Angio-Seal device with excellent   hemostasis.   FINAL INTERPRETATION:  1. Two-vessel obstructive coronary artery disease with acute occlusion      of the proximal right coronary artery.  2. Continued patency of the stent in the proximal left anterior      descending.  3. Good left ventricular function.  4. Successful intracoronary stenting of the proximal right coronary.           ______________________________  Peter M. Swaziland, M.D.     PMJ/MEDQ  D:  01/07/2006  T:  01/08/2006  Job:  [id number]   cc:   Madaline Savage, M.D.

## 2010-05-23 NOTE — Cardiovascular Report (Signed)
NAME:  Shelley [name], Shelley [name]                    ACCOUNT NO.:  [id number]   MEDICAL RECORD NO.:  [id number]                   PATIENT TYPE:  OIB   LOCATION:  2854                                 FACILITY:  [facility]   PHYSICIAN:  Darlin Priestly, M.D.             DATE OF BIRTH:  1955/05/06   DATE OF PROCEDURE:  01/12/2002  DATE OF DISCHARGE:                              CARDIAC CATHETERIZATION   PROCEDURES:  1. Left heart catheterization.  2. Coronary angiography.  3. Left ventriculogram.  4. Abdominal aortogram.   COMPLICATIONS:  None.   INDICATIONS:  The patient is a 55 year old female, patient of Dr. Butch Penny and Dr. Lavonne Chick, with a history of PTCA and stenting of the LAD  in July of 2000.  The patient also has a history of tobacco use.  She has  been having intermittent substernal chest pain, which is nitroglycerin  responsive.  She is now referred for repeat catheterization.  Her last  Cardiolite scan in July of 2003 revealed possible anterior wall ischemia,  thought it is difficult to determine secondary to breast attenuation.   DESCRIPTION OF PROCEDURE:  After given informed written consent, the patient  was brought to the cardiac catheterization lab where her right and left  groins were shaved, prepped, and draped in the usual sterile fashion.  ECG  monitoring was established.  Using modified Seldinger technique, a #6 French  arterial sheath inserted in the right femoral artery.  A 6 French diagnostic  catheters were used to perform diagnostic angiography.  This reveals a large  left main with no significant disease.  The LAD is a large vessel which  coursed to the apex and is one diagonal branch. There is mild 30% proximal  narrowing.  There is a stent noted in the proximal LAD with no evidence of  in-stent re-stenosis.  There is 40% mid LAD stenosis.  There is one medium  sized diagonal branch which bifurcates distally and arises within the  previously stented  LAD segment.  There appears to be approximately 70%  ostial narrowing.  This is unchanged from July of 2000.   The left circumflex is a large vessel which courses in the AV groove and  gave rise to one obtuse marginal branch.  The AV groove has mild 20%  proximal narrowing.  The rest of the A-V groove circumflex has no  significant disease.  The first OM is a large vessel which bifurcates in the  mid segment and has no significant disease.   The right coronary artery is a large vessel which is dominant and it gives  rise to both PDA as well as the posterolateral branch.  The RCA is noted to  have 30% early mid narrowing.  There is course irregularities throughout the  remainder of the RCA but no high-grade stenosis.  The PDA and posterolateral  branch have no significant disease.   LEFT VENTRICULOGRAM:  The left ventriculogram  reveals preserved EF of 50%.   ABDOMINAL AORTOGRAPHY:  Abdominal aortogram reveals no evidence of renal  artery stenosis.   HEMODYNAMICS:  Systemic arterial pressure 140/82, LV systemic pressure  150/3, LVEDP of 10.    CONCLUSIONS:  1. One-vessel coronary artery disease involving the small diagonal branch,     which arises within the previously stented left anterior descending     segment which is essentially unchanged from July of 2000.  2. Normal left ventricular systolic function.  3. No evidence of renal artery stenosis.  4. Systemic hypertension.                                                   Darlin Priestly, M.D.    RHM/MEDQ  D:  01/12/2002  T:  01/12/2002  Job:  [id number]   cc:   Angus G. Renard Matter, M.D.  [address]  [place]  [place] [address]  Fax: [phone]   Madaline Savage, M.D.  [phone] [address], [address]  [place]  [place] [address]  Fax: [phone]

## 2010-07-07 ENCOUNTER — Other Ambulatory Visit (HOSPITAL_COMMUNITY): Payer: Self-pay | Admitting: Cardiology

## 2010-07-07 DIAGNOSIS — I10 Essential (primary) hypertension: Secondary | ICD-10-CM

## 2010-07-07 DIAGNOSIS — I2510 Atherosclerotic heart disease of native coronary artery without angina pectoris: Secondary | ICD-10-CM

## 2010-08-05 ENCOUNTER — Encounter (HOSPITAL_COMMUNITY)
Admission: RE | Admit: 2010-08-05 | Discharge: 2010-08-05 | Disposition: A | Discharge status: Home / Self Care | Payer: 59 | Source: Ambulatory Visit | Attending: Cardiology | Admitting: Cardiology

## 2010-08-05 DIAGNOSIS — I252 Old myocardial infarction: Secondary | ICD-10-CM | POA: Insufficient documentation

## 2010-08-05 DIAGNOSIS — I4949 Other premature depolarization: Secondary | ICD-10-CM | POA: Insufficient documentation

## 2010-08-05 DIAGNOSIS — R5381 Other malaise: Secondary | ICD-10-CM | POA: Insufficient documentation

## 2010-08-05 DIAGNOSIS — R0602 Shortness of breath: Secondary | ICD-10-CM | POA: Insufficient documentation

## 2010-08-05 DIAGNOSIS — I10 Essential (primary) hypertension: Secondary | ICD-10-CM | POA: Insufficient documentation

## 2010-08-05 DIAGNOSIS — E7800 Pure hypercholesterolemia, unspecified: Secondary | ICD-10-CM | POA: Insufficient documentation

## 2010-08-05 DIAGNOSIS — E119 Type 2 diabetes mellitus without complications: Secondary | ICD-10-CM | POA: Insufficient documentation

## 2010-08-05 DIAGNOSIS — E039 Hypothyroidism, unspecified: Secondary | ICD-10-CM | POA: Insufficient documentation

## 2010-08-05 DIAGNOSIS — I2510 Atherosclerotic heart disease of native coronary artery without angina pectoris: Secondary | ICD-10-CM | POA: Insufficient documentation

## 2010-08-05 DIAGNOSIS — R5383 Other fatigue: Secondary | ICD-10-CM | POA: Insufficient documentation

## 2010-08-05 MED ORDER — TECHNETIUM TC 99M TETROFOSMIN IV KIT
10.0000 | PACK | Freq: Once | INTRAVENOUS | Status: AC | PRN
  Administered 2010-08-05: 10 via INTRAVENOUS

## 2010-08-05 MED ORDER — TECHNETIUM TC 99M TETROFOSMIN IV KIT
30.0000 | PACK | Freq: Once | INTRAVENOUS | Status: AC | PRN
  Administered 2010-08-05: 30 via INTRAVENOUS

## 2010-08-24 ENCOUNTER — Inpatient Hospital Stay (INDEPENDENT_AMBULATORY_CARE_PROVIDER_SITE_OTHER)
Admission: RE | Admit: 2010-08-24 | Discharge: 2010-08-24 | Disposition: A | Discharge status: Home / Self Care | Payer: 59 | Source: Ambulatory Visit | Attending: Emergency Medicine | Admitting: Emergency Medicine

## 2010-08-24 DIAGNOSIS — B354 Tinea corporis: Secondary | ICD-10-CM

## 2010-08-24 DIAGNOSIS — L299 Pruritus, unspecified: Secondary | ICD-10-CM

## 2010-09-05 ENCOUNTER — Inpatient Hospital Stay (INDEPENDENT_AMBULATORY_CARE_PROVIDER_SITE_OTHER)
Admission: RE | Admit: 2010-09-05 | Discharge: 2010-09-05 | Disposition: A | Discharge status: Home / Self Care | Payer: 59 | Source: Ambulatory Visit | Attending: Emergency Medicine | Admitting: Emergency Medicine

## 2010-09-05 DIAGNOSIS — L089 Local infection of the skin and subcutaneous tissue, unspecified: Secondary | ICD-10-CM

## 2010-09-08 LAB — WOUND CULTURE

## 2010-10-13 ENCOUNTER — Inpatient Hospital Stay (INDEPENDENT_AMBULATORY_CARE_PROVIDER_SITE_OTHER)
Admission: RE | Admit: 2010-10-13 | Discharge: 2010-10-13 | Disposition: A | Discharge status: Home / Self Care | Payer: 59 | Source: Ambulatory Visit | Attending: Family Medicine | Admitting: Family Medicine

## 2010-10-13 DIAGNOSIS — L299 Pruritus, unspecified: Secondary | ICD-10-CM

## 2010-10-17 LAB — COMPREHENSIVE METABOLIC PANEL
AST: 23
Albumin: 3.6
Calcium: 8.9
Creatinine, Ser: 0.82
GFR calc Af Amer: >60
Sodium: 141
Total Protein: 6.5

## 2010-10-17 LAB — CBC
HCT: 44
HCT: 41.1
HCT: 41.8
Hemoglobin: 15
Hemoglobin: 14.2
MCHC: 34
MCHC: 33.4
MCV: 87.5
MCV: 89
Platelets: 277
RBC: 5.03
RBC: 4.7
RDW: 13.9
WBC: 9.3
WBC: 9.5

## 2010-10-17 LAB — BASIC METABOLIC PANEL
CO2: 29
Calcium: 9
Chloride: 103
Chloride: 109
GFR calc Af Amer: >60
GFR calc Af Amer: >60
GFR calc non Af Amer: >60
GFR calc non Af Amer: >60
Glucose, Bld: 110 — ABNORMAL HIGH
Glucose, Bld: 175 — ABNORMAL HIGH
Potassium: 3.6
Potassium: 3.5
Potassium: 3.7
Sodium: 140
Sodium: 138

## 2010-10-17 LAB — CARDIAC PANEL(CRET KIN+CKTOT+MB+TROPI)
CK, MB: 3.6
Total CK: 194 — ABNORMAL HIGH
Troponin I: 0.01

## 2010-10-17 LAB — DIFFERENTIAL
Eosinophils Relative: 3
Lymphocytes Relative: 17
Lymphocytes Relative: 44
Lymphs Abs: 1.6
Lymphs Abs: 3.5 — ABNORMAL HIGH
Monocytes Absolute: 0.9 — ABNORMAL HIGH
Monocytes Relative: 11
Neutrophils Relative %: 75

## 2010-10-17 LAB — CK TOTAL AND CKMB (NOT AT ARMC): Relative Index: 2.3

## 2010-10-17 LAB — APTT: aPTT: 40 — ABNORMAL HIGH

## 2010-10-17 LAB — LIPID PANEL
Cholesterol: 126
Total CHOL/HDL Ratio: 3.9

## 2010-10-17 LAB — TSH: TSH: 1.041

## 2010-10-17 LAB — POCT CARDIAC MARKERS
CKMB, poc: 3.2
Myoglobin, poc: 52.4

## 2010-10-17 LAB — PROTIME-INR
INR: 1
Prothrombin Time: 13.7

## 2010-10-17 LAB — TROPONIN I: Troponin I: 0.03

## 2010-10-17 LAB — HEMOGLOBIN A1C: Hgb A1c MFr Bld: 6.3 — ABNORMAL HIGH

## 2011-01-23 ENCOUNTER — Ambulatory Visit
Admission: RE | Admit: 2011-01-23 | Discharge: 2011-01-23 | Disposition: A | Discharge status: Home / Self Care | Payer: 59 | Source: Ambulatory Visit | Attending: Allergy and Immunology | Admitting: Allergy and Immunology

## 2011-01-23 ENCOUNTER — Other Ambulatory Visit: Payer: Self-pay | Admitting: Allergy and Immunology

## 2011-01-23 DIAGNOSIS — R05 Cough: Secondary | ICD-10-CM

## 2011-01-23 DIAGNOSIS — R059 Cough, unspecified: Secondary | ICD-10-CM

## 2011-04-27 ENCOUNTER — Encounter (HOSPITAL_COMMUNITY): Payer: Self-pay | Admitting: Emergency Medicine

## 2011-04-27 ENCOUNTER — Emergency Department (HOSPITAL_COMMUNITY)
Admission: EM | Admit: 2011-04-27 | Discharge: 2011-04-27 | Disposition: A | Discharge status: Home / Self Care | Payer: 59 | Attending: Emergency Medicine | Admitting: Emergency Medicine

## 2011-04-27 ENCOUNTER — Other Ambulatory Visit: Payer: Self-pay

## 2011-04-27 ENCOUNTER — Emergency Department (HOSPITAL_COMMUNITY): Payer: 59

## 2011-04-27 DIAGNOSIS — M549 Dorsalgia, unspecified: Secondary | ICD-10-CM | POA: Insufficient documentation

## 2011-04-27 DIAGNOSIS — Z79899 Other long term (current) drug therapy: Secondary | ICD-10-CM | POA: Insufficient documentation

## 2011-04-27 DIAGNOSIS — R079 Chest pain, unspecified: Secondary | ICD-10-CM | POA: Insufficient documentation

## 2011-04-27 DIAGNOSIS — E079 Disorder of thyroid, unspecified: Secondary | ICD-10-CM | POA: Insufficient documentation

## 2011-04-27 DIAGNOSIS — E119 Type 2 diabetes mellitus without complications: Secondary | ICD-10-CM | POA: Insufficient documentation

## 2011-04-27 DIAGNOSIS — I10 Essential (primary) hypertension: Secondary | ICD-10-CM | POA: Insufficient documentation

## 2011-04-27 DIAGNOSIS — I252 Old myocardial infarction: Secondary | ICD-10-CM | POA: Insufficient documentation

## 2011-04-27 HISTORY — DX: Essential (primary) hypertension: I10

## 2011-04-27 HISTORY — DX: Disorder of thyroid, unspecified: E07.9

## 2011-04-27 HISTORY — DX: Acute myocardial infarction, unspecified: I21.9

## 2011-04-27 LAB — BASIC METABOLIC PANEL
BUN: 10 mg/dL (ref 6–23)
Chloride: 103 mEq/L (ref 96–112)
GFR calc Af Amer: >90 mL/min (ref >90)
Glucose, Bld: 183 mg/dL — ABNORMAL HIGH (ref 70–99)
Potassium: 3.4 mEq/L — ABNORMAL LOW (ref 3.5–5.1)
Sodium: 140 mEq/L (ref 135–145)

## 2011-04-27 LAB — CBC
HCT: 42.6 % (ref 36.0–46.0)
Hemoglobin: 14.5 g/dL (ref 12.0–15.0)
WBC: 8 10*3/uL (ref 4.0–10.5)

## 2011-04-27 LAB — POCT I-STAT TROPONIN I

## 2011-04-27 LAB — D-DIMER, QUANTITATIVE: D-Dimer, Quant: 0.27 ug/mL-FEU (ref 0.00–0.48)

## 2011-04-27 LAB — TROPONIN I: Troponin I: <0.3 ng/mL (ref <0.30)

## 2011-04-27 MED ORDER — HYDROCODONE-ACETAMINOPHEN 5-325 MG PO TABS: 1.0000 | ORAL_TABLET | Freq: Four times a day (QID) | ORAL | Status: AC | PRN

## 2011-04-27 MED ORDER — ASPIRIN 81 MG PO CHEW
324.0000 mg | CHEWABLE_TABLET | Freq: Once | ORAL | Status: AC
  Administered 2011-04-27: 324 mg via ORAL
  Filled 2011-04-27: qty 4

## 2011-04-27 MED ORDER — SODIUM CHLORIDE 0.9 % IV SOLN: INTRAVENOUS | Status: DC

## 2011-04-27 MED ORDER — SODIUM CHLORIDE 0.9 % IV BOLUS (SEPSIS)
250.0000 mL | Freq: Once | INTRAVENOUS | Status: AC
  Administered 2011-04-27: 250 mL via INTRAVENOUS

## 2011-04-27 NOTE — ED Notes (Signed)
Pt with right sided CP starting yesterday.  Denies N/V

## 2011-04-27 NOTE — ED Provider Notes (Signed)
History   This chart was scribed for Shelda Jakes, MD by Toya Smothers. The patient was seen in room APA04/APA04. Patient's care was started at 1647.   CSN: [id number]  Arrival date & time 04/27/11  1647   First MD Initiated Contact with Patient 04/27/11 1651      Chief Complaint  Patient presents with  . Chest Pain    (Consider location/radiation/quality/duration/timing/severity/associated sxs/prior treatment) HPI Shelley [name] is a 56 y.o. female who presents to the Emergency Department complaining of waxing and waning moderate right chest pain radiating to back onset 8PM yesterday. Patient rates pain 7 out of 10. Patient lists deep breathing as a aggravating factor. Denies h/o similar symptoms, SOB, emesis, fever, chill, sore throat, congestion, cough, headache, neck pain, lower back pain, dysuria, and hemorrhage. Pt is currently being treated for a rash, has h/o MI in 2008 w/ two cardiac stents in 1999. Pt has been seen at [facility] by Dr. Clarene Duke and list PCP as Dr. Renard Matter.    Past Medical History  Diagnosis Date  . Hypertension   . Diabetes mellitus   . MI (myocardial infarction)   . Thyroid disease     Past Surgical History  Procedure Date  . Stents   . Back surgery     No family history on file.  History  Substance Use Topics  . Smoking status: Former Games developer  . Smokeless tobacco: Not on file  . Alcohol Use: No    OB History    Grav Para Term Preterm Abortions TAB SAB Ect Mult Living                  Review of Systems  Constitutional: Negative for fever and chills.  HENT: Negative for rhinorrhea and neck pain.   Eyes: Negative for pain.  Respiratory: Negative for cough and shortness of breath.   Cardiovascular: Positive for chest pain.  Gastrointestinal: Negative for nausea, vomiting, abdominal pain and diarrhea.  Genitourinary: Negative for dysuria.  Musculoskeletal: Negative for back pain.  Skin: Negative for rash.    Neurological: Negative for weakness and headaches.    Allergies  Review of patient's allergies indicates no known allergies.  Home Medications   Current Outpatient Rx  Name Route Sig Dispense Refill  . ALBUTEROL SULFATE HFA 108 (90 BASE) MCG/ACT IN AERS Inhalation Inhale 2 puffs into the lungs every 6 (six) hours as needed. For coughing and/or wheezing    . DILTIAZEM HCL ER COATED BEADS 360 MG PO CP24 Oral Take 360 mg by mouth daily.    Marland Kitchen DOXEPIN HCL 10 MG PO CAPS Oral Take 10 mg by mouth at bedtime.    Marland Kitchen FEXOFENADINE HCL 180 MG PO TABS Oral Take 180 mg by mouth 2 (two) times daily.    Marland Kitchen KETOCONAZOLE 2 % EX CREA Topical Apply 1 application topically as needed. For skin irritation    . METFORMIN HCL ER 500 MG PO TB24 Oral Take 1,000 mg by mouth 2 (two) times daily.    Marland Kitchen ROSUVASTATIN CALCIUM 10 MG PO TABS Oral Take 10 mg by mouth daily.    . THYROID 15 MG PO TABS Oral Take 15 mg by mouth every morning. Take one tablet with one 60mg  tablet for every morning before breakfast for a total 75mg     . THYROID 60 MG PO TABS Oral Take 60 mg by mouth every morning. Take one 60mg  tablet with a 15mg  tablet for a total 75mg  every morning before breakfast    .  VALSARTAN-HYDROCHLOROTHIAZIDE 160-12.5 MG PO TABS Oral Take 1 tablet by mouth daily.    Marland Kitchen HYDROCODONE-ACETAMINOPHEN 5-325 MG PO TABS Oral Take 1-2 tablets by mouth every 6 (six) hours as needed for pain. 10 tablet 0    BP 148/91  Pulse 102  Temp(Src) 99.3 F (37.4 C) (Oral)  Resp 20  Ht 5\' 5"  (1.651 m)  Wt 212 lb (96.163 kg)  BMI 35.28 kg/m2  SpO2 94%  Physical Exam  Nursing note and vitals reviewed. Constitutional: She is oriented to person, place, and time. She appears well-developed and well-nourished. No distress.  HENT:  Head: Normocephalic and atraumatic.  Eyes: EOM are normal. Pupils are equal, round, and reactive to light.  Neck: Neck supple. No tracheal deviation present.  Cardiovascular: Normal rate, regular rhythm, normal  heart sounds and intact distal pulses.  Exam reveals no gallop and no friction rub.   No murmur heard. Pulmonary/Chest: Effort normal. No respiratory distress. She has no wheezes. She has no rales.  Abdominal: Soft. Bowel sounds are normal. She exhibits no distension. There is no tenderness.  Musculoskeletal: Normal range of motion. She exhibits no edema.  Neurological: She is alert and oriented to person, place, and time. No cranial nerve deficit or sensory deficit.  Skin: Skin is warm and dry.  Psychiatric: She has a normal mood and affect. Her behavior is normal.    ED Course  Procedures (including critical care time)   Labs Reviewed  BASIC METABOLIC PANEL - Abnormal; Notable for the following:    Potassium 3.4 (*)    Glucose, Bld 183 (*)    All other components within normal limits  CBC  POCT I-STAT TROPONIN I  TROPONIN I  D-DIMER, QUANTITATIVE   Dg Chest 2 View  04/27/2011  *RADIOLOGY REPORT*  Clinical Data: Right chest pain  CHEST - 2 VIEW  Comparison: 01/23/2011  Findings: Some worsening of patchy atelectasis or interstitial infiltrates in the right middle lobe and posterior basilar segments of both lower lobes.  No definite effusion.  Heart size upper limits normal.  Tortuous thoracic aorta.  Regional bones unremarkable.  IMPRESSION:  Worsening bibasilar atelectasis   or infiltrates.  Original Report Authenticated By: Osa Craver, M.D.   Results for orders placed during the hospital encounter of 04/27/11  CBC      Component Value Range   WBC 8.0  4.0 - 10.5 (K/uL)   RBC 5.05  3.87 - 5.11 (MIL/uL)   Hemoglobin 14.5  12.0 - 15.0 (g/dL)   HCT 81.1  91.4 - 78.2 (%)   MCV 84.4  78.0 - 100.0 (fL)   MCH 28.7  26.0 - 34.0 (pg)   MCHC 34.0  30.0 - 36.0 (g/dL)   RDW 95.6  21.3 - 08.6 (%)   Platelets 274  150 - 400 (K/uL)  BASIC METABOLIC PANEL      Component Value Range   Sodium 140  135 - 145 (mEq/L)   Potassium 3.4 (*) 3.5 - 5.1 (mEq/L)   Chloride 103  96 - 112  (mEq/L)   CO2 28  19 - 32 (mEq/L)   Glucose, Bld 183 (*) 70 - 99 (mg/dL)   BUN 10  6 - 23 (mg/dL)   Creatinine, Ser 5.78  0.50 - 1.10 (mg/dL)   Calcium 9.5  8.4 - 46.9 (mg/dL)   GFR calc non Af Amer >90  >90 (mL/min)   GFR calc Af Amer >90  >90 (mL/min)  POCT I-STAT TROPONIN I  Component Value Range   Troponin i, poc 0.00  0.00 - 0.08 (ng/mL)   Comment 3           TROPONIN I      Component Value Range   Troponin I <0.30  <0.30 (ng/mL)  D-DIMER, QUANTITATIVE      Component Value Range   D-Dimer, Quant 0.27  0.00 - 0.48 (ug/mL-FEU)     Date: 04/27/2011  Rate: 101  Rhythm: sinus tachycardia  QRS Axis: normal  Intervals: normal  ST/T Wave abnormalities: nonspecific T wave changes  Conduction Disutrbances:none  Narrative Interpretation:   Old EKG Reviewed: none available Correction no significant change in EKG compared to 09/02/2006.  1. Chest pain       MDM  Workup for chest pain without acute findings EKG without any specific change from August of 2008. First troponin negative symptoms started last night at 8:00 services well over 6 hours on this troponin is negative. Discussed with on call [facility] and they concurred that is safe for the patient to be discharged home with a negative troponin and no change in EKG. Also d-dimer was normal so not consistent with pulmonary embolism. Chest x-ray without acute changes. Patient instructed to return if chest pain worsens or develops on the left side and start taking an aspirin a day hydrocodone provided as necessary for pain. We will call her cardiologist for followup in the next few days.     I personally performed the services described in this documentation, which was scribed in my presence. The recorded information has been reviewed and considered.          Shelda Jakes, MD 04/27/11 [phone]

## 2011-04-27 NOTE — ED Notes (Signed)
Pt DC to home with steady gait 

## 2011-04-27 NOTE — Discharge Instructions (Signed)
Aspirin and Your Heart Aspirin affects the way your blood clots and helps "thin" the blood. Aspirin has many uses in heart disease. It may be used as a primary prevention to help reduce the risk of heart related events. It also can be used as a secondary measure to prevent more heart attacks or to prevent additional damage from blood clots.  ASPIRIN MAY HELP IF YOU:  Have had a heart attack or chest pain.   Have undergone open heart surgery such as CABG (Coronary Artery Bypass Surgery).   Have had coronary angioplasty with or without stents.   Have experienced a stroke or TIA (transient ischemic attack).   Have peripheral vascular disease (PAD).   Have chronic heart rhythm problems such as atrial fibrillation.   Are at risk for heart disease.  BEFORE STARTING ASPIRIN Before you start taking aspirin, your caregiver will need to review your medical history. Many things will need to be taken into consideration, such as:  Smoking status.   Blood pressure.   Diabetes.   Gender.   Weight.   Cholesterol level.  ASPIRIN DOSES  Aspirin should only be taken on the advice of your caregiver. Talk to your caregiver about how much aspirin you should take. Aspirin comes in different doses such as:   81 mg.   162 mg.   325 mg.   The aspirin dose you take may be affected by many factors, some of which include:   Your current medications, especially if your are taking blood-thinners or anti-platelet medicine.   Liver function.   Heart disease risk.   Age.   Aspirin comes in two forms:   Non-enteric-coated. This type of aspirin does not have a coating and is absorbed faster. Non-enteric coated aspirin is recommended for patients experiencing chest pain symptoms. This type of aspirin also comes in a chewable form.   Enteric-coated. This means the aspirin has a special coating that releases the medicine very slowly. Enteric-coated aspirin causes less stomach upset. This type of  aspirin should not be chewed or crushed.  ASPIRIN SIDE EFFECTS Daily use of aspirin can increase your risk of serious side effects, some of these include:  Increased bleeding. This can range from a cut that does not stop bleeding to more serious problems such as stomach bleeding or bleeding into the brain (Intracerebral bleeding).   Increased bruising.   Stomach upset.   An allergic reaction such as red, itchy skin.   Increased risk of bleeding when combined with non-steroidal anti-inflammatory medicine (NSAIDS).   Alcohol should be drank in moderation when taking aspirin. Alcohol can increase the risk of stomach bleeding when taken with aspirin.   Aspirin should not be given to children less than 18 years of age due to the association of Reye syndrome. Reye syndrome is a serious illness that can affect the brain and liver. Studies have linked Reye syndrome with aspirin use in children.   People that have nasal polyps have an increased risk of developing an aspirin allergy.  SEEK MEDICAL CARE IF:   You develop an allergic reaction such as:   Hives.   Itchy skin.   Swelling of the lips, tongue or face.   You develop stomach pain.   You have unusual bleeding or bruising.   You have ringing in your ears.  SEEK IMMEDIATE MEDICAL CARE IF:   You have severe chest pain, especially if the pain is crushing or pressure-like and spreads to the arms, back, neck, or jaw. THIS   IS AN EMERGENCY. Do not wait to see if the pain will go away. Get medical help at once. Call your local emergency services (911 in the [place] DO NOT drive yourself to the hospital.   You have stroke-like symptoms such as:   Loss of vision.   Difficulty talking.   Numbness or weakness on one side of your body.   Numbness or weakness in your arm or leg.   Not thinking clearly or feeling confused.   Your bowel movements are bloody, dark red or black in color.   You vomit or cough up blood.   You have blood  in your urine.   You have shortness of breath, coughing or wheezing.  MAKE SURE YOU:   Understand these instructions.   Will monitor your condition.   Seek immediate medical care if necessary.  Document Released: 12/05/2007 Document Revised: 12/11/2010 Document Reviewed: 12/05/2007 [facility] Patient Information 2012 [place], [place].  Start taking an aspirin a day.give your cardiologist a call for followup in the office in the next few days. They are expecting you to call. Return for any new or worse chest pain or particularly any chest pain that develops on the left side. Today's workup was negative we did discuss it with [facility] on-call and it's okay for you to go home. Hydrocodone pain medicine provided as needed.

## 2011-12-14 ENCOUNTER — Ambulatory Visit: Payer: 59

## 2012-01-06 HISTORY — PX: ESOPHAGOGASTRODUODENOSCOPY: SHX1529

## 2012-02-20 ENCOUNTER — Other Ambulatory Visit: Payer: Self-pay

## 2012-03-14 ENCOUNTER — Ambulatory Visit (INDEPENDENT_AMBULATORY_CARE_PROVIDER_SITE_OTHER): Payer: Self-pay | Admitting: Family Medicine

## 2012-03-14 DIAGNOSIS — E119 Type 2 diabetes mellitus without complications: Secondary | ICD-10-CM | POA: Insufficient documentation

## 2012-03-14 NOTE — Progress Notes (Signed)
Patient presents today for 3 month DM follow-up as part of the employer-sponsored link to [facility] program. Medications, and glucose readings have been reviewed. I have also discussed with patient lifestyle interventions such as diet and exercise. Details of the visit can be found in [employer] documenting program through [employer] [facility]). Patient has set a series of personal goals and will follow-up in 3 months for further review of DM.  Patient seen by Drue Stager PharmD resident

## 2012-03-14 NOTE — Assessment & Plan Note (Addendum)
A1C today up slightly today to 7.1% (up from 6.9% at last visit). Her weight is stable at this time and attributes lack of progress to limited physical activity but has been consistent with smaller portion sizes. CBGs are mainly in the low 100s fasting without any significant hypoglycemia but some minor lows (80s with sx) <once a month. Will continue to work on diet and exercise and follow up in 3 months  Patient seen by Drue Stager PharmD resident

## 2012-04-05 NOTE — Progress Notes (Signed)
Patient ID: Shelley [name], female   DOB: 04-Mar-1955, 57 y.o.   MRN: [id number] ATTENDING PHYSICIAN NOTE: I have reviewed the chart and agree with the plan as detailed above. Denny Levy MD Pager [phone]

## 2012-06-17 ENCOUNTER — Ambulatory Visit (INDEPENDENT_AMBULATORY_CARE_PROVIDER_SITE_OTHER): Payer: Self-pay | Admitting: Family Medicine

## 2012-06-17 VITALS — BP 116/74 | HR 85 | Ht 65.0 in | Wt 203.6 lb

## 2012-06-17 DIAGNOSIS — E119 Type 2 diabetes mellitus without complications: Secondary | ICD-10-CM

## 2012-06-17 NOTE — Progress Notes (Signed)
Subjective:  Patient presents today for 3 month diabetes follow-up as part of the employer-sponsored Link to Wellness program. Current diabetes regimen includes Metformin. Patient also continues on daily ASA, ACEi, and statin. Most recent MD follow-up was with Dr. Talmage Nap. No med changes or major health changes at this time.   No significant complaints, but does endorse some upset stomach on Metfromin. She says it is not too bad as long as she takes it with food.    Disease Assessments:  Diabetes:  takes medications as prescribed; takes an aspirin a day; hypoglycemia frequency rarely; Type of Diabetes: Type 2; Sees Diabetes provider 2 times per year; MD managing Diabetes Balan; checks blood glucose once daily; checks feet daily; uses glucometer;   7 day CBG average 115; 14 day CBG average 110; 30 day CBG average 118; Highest CBG 142; Lowest CBG 92; Other Diabetes History: No hypoglycemic episodes- patient voiced understanding of how to manage symptoms.   She checks daily in the morning with an average in 110s.      Social History:  Caffeine use: 1 servings per day; Denies alcohol use; Diet adherence 50-75% of the time;  Exercise adherence 5 days or more days a week; Medication adherence adherent; Patient can afford medications; Patient knows the purpose/use of medications; 150 minutes of exercise per week.  Occupation: EKG Tech   Exercise- still bowling twice a week, walks with sister twice a week and other days takes a walk around work on her break. On the days when she doesn't walk she is using the stationary bike for 15-20 minutes but mostly uses this on really cold or hot days. Is considering getting a fitbit to help with exercise.   Nutrition-   B- oatmeal, orange juice (8 oz)  L- Salad (tomatoes, lettuce, french dressing); tea  D- chinese take out (general tso's chicken with broccoli and rice), slice of cake, tea   Has cut back on the sugar in her tea- making 1 gallon of tea- only  using 1 cup of sugar (was using 2 cups). Has also increased her water consumption during the day- drinking water 2-3 bottles a day. Didn't like the crystal light packets.   Testing:  Blood Sugar Tests:  Hemoglobin A1c: 7.1     Assessment/Plan: A1C is consistent at 7.1% today. Her weight is down some at this time and attributes this to more physical activity and good consistency with smaller portion sizes. Does not feel like she could eat much less than she currently does.  BG have remained good in the low 100s fasting.   Will fax A1C results to both Dr. Talmage Nap and Renard Matter.  Patient seen by Drue Stager, PharmD.    Goals for next visit:  1. Increase physical activity by using the treadmill 2-3x a week after work with friends. 2. Weight goal for next visit is <200lbs 3. Continue your dietary choices 4. Follow up with your podiatrist this month for your ingrown toenail. 5. We will fax Dr. Talmage Nap and Dr. Frederic Jericho your A1c  We will see you back in 3 months on Sept the 8th at 3:30pm

## 2012-06-27 NOTE — Progress Notes (Signed)
Patient ID: Shelley [name], female   DOB: 01/05/1956, 56 y.o.   MRN: [id number] ATTENDING PHYSICIAN NOTE: I have reviewed the chart and agree with the plan as detailed above. [name] MD Pager [phone]  

## 2012-07-01 ENCOUNTER — Telehealth: Payer: Self-pay | Admitting: Cardiology

## 2012-07-01 MED ORDER — AZITHROMYCIN 250 MG PO TABS: ORAL_TABLET | ORAL | Status: DC

## 2012-07-01 NOTE — Telephone Encounter (Signed)
Pt called with complaints of congestion with discolored mucus.  She has tried multiple over the conunter meds. [facility] [address].  Will ask triage to call this in for her.

## 2012-07-01 NOTE — Telephone Encounter (Signed)
Rx sent to pharmacy   

## 2012-08-03 ENCOUNTER — Encounter: Payer: Self-pay | Admitting: Gastroenterology

## 2012-08-09 ENCOUNTER — Other Ambulatory Visit (HOSPITAL_COMMUNITY): Payer: Self-pay | Admitting: Family Medicine

## 2012-08-09 ENCOUNTER — Ambulatory Visit (HOSPITAL_COMMUNITY)
Admission: RE | Admit: 2012-08-09 | Discharge: 2012-08-09 | Disposition: A | Discharge status: Home / Self Care | Payer: 59 | Source: Ambulatory Visit | Attending: Family Medicine | Admitting: Family Medicine

## 2012-08-09 DIAGNOSIS — R1011 Right upper quadrant pain: Secondary | ICD-10-CM | POA: Insufficient documentation

## 2012-08-09 DIAGNOSIS — R197 Diarrhea, unspecified: Secondary | ICD-10-CM

## 2012-08-09 DIAGNOSIS — R112 Nausea with vomiting, unspecified: Secondary | ICD-10-CM | POA: Insufficient documentation

## 2012-08-09 MED ORDER — IOHEXOL 300 MG/ML  SOLN
100.0000 mL | Freq: Once | INTRAMUSCULAR | Status: AC | PRN
  Administered 2012-08-09: 100 mL via INTRAVENOUS

## 2012-08-14 ENCOUNTER — Encounter: Payer: Self-pay | Admitting: *Deleted

## 2012-08-18 ENCOUNTER — Encounter: Payer: Self-pay | Admitting: Cardiovascular Disease

## 2012-08-18 ENCOUNTER — Ambulatory Visit (INDEPENDENT_AMBULATORY_CARE_PROVIDER_SITE_OTHER): Payer: 59 | Admitting: Cardiovascular Disease

## 2012-08-18 VITALS — BP 146/80 | HR 75 | Resp 16 | Ht 65.0 in | Wt 204.7 lb

## 2012-08-18 DIAGNOSIS — E785 Hyperlipidemia, unspecified: Secondary | ICD-10-CM

## 2012-08-18 DIAGNOSIS — I25810 Atherosclerosis of coronary artery bypass graft(s) without angina pectoris: Secondary | ICD-10-CM

## 2012-08-18 DIAGNOSIS — I2510 Atherosclerotic heart disease of native coronary artery without angina pectoris: Secondary | ICD-10-CM

## 2012-08-18 DIAGNOSIS — E119 Type 2 diabetes mellitus without complications: Secondary | ICD-10-CM

## 2012-08-18 DIAGNOSIS — E669 Obesity, unspecified: Secondary | ICD-10-CM

## 2012-08-18 DIAGNOSIS — Z79899 Other long term (current) drug therapy: Secondary | ICD-10-CM

## 2012-08-18 DIAGNOSIS — I10 Essential (primary) hypertension: Secondary | ICD-10-CM

## 2012-08-18 NOTE — Patient Instructions (Addendum)
If your GI(stomach) physician advises you to stay off your aspirin please call our office. [phone].  Your physician recommends that you return for lab work. Please remember to fast prior to having the lab work performed.  Your physician recommends that you schedule a follow-up appointment in: 12 months

## 2012-08-30 ENCOUNTER — Encounter: Payer: Self-pay | Admitting: Gastroenterology

## 2012-08-30 ENCOUNTER — Ambulatory Visit (INDEPENDENT_AMBULATORY_CARE_PROVIDER_SITE_OTHER): Payer: 59 | Admitting: Gastroenterology

## 2012-08-30 VITALS — BP 106/66 | HR 86 | Ht 65.0 in | Wt 203.0 lb

## 2012-08-30 DIAGNOSIS — E785 Hyperlipidemia, unspecified: Secondary | ICD-10-CM | POA: Insufficient documentation

## 2012-08-30 DIAGNOSIS — E669 Obesity, unspecified: Secondary | ICD-10-CM | POA: Insufficient documentation

## 2012-08-30 DIAGNOSIS — E66811 Obesity, class 1: Secondary | ICD-10-CM | POA: Insufficient documentation

## 2012-08-30 DIAGNOSIS — I2510 Atherosclerotic heart disease of native coronary artery without angina pectoris: Secondary | ICD-10-CM | POA: Insufficient documentation

## 2012-08-30 DIAGNOSIS — R1011 Right upper quadrant pain: Secondary | ICD-10-CM

## 2012-08-30 DIAGNOSIS — I10 Essential (primary) hypertension: Secondary | ICD-10-CM | POA: Insufficient documentation

## 2012-08-30 DIAGNOSIS — R933 Abnormal findings on diagnostic imaging of other parts of digestive tract: Secondary | ICD-10-CM

## 2012-08-30 DIAGNOSIS — R197 Diarrhea, unspecified: Secondary | ICD-10-CM

## 2012-08-30 MED ORDER — OMEPRAZOLE 40 MG PO CPDR: 40.0000 mg | DELAYED_RELEASE_CAPSULE | Freq: Every day | ORAL | Status: DC

## 2012-08-30 NOTE — Assessment & Plan Note (Signed)
No symptoms to suggest angina pectoris. Coronary risk factors are for the most part well addressed. She remains moderately obese. Her electrocardiographic changes are old and have been seen to wax and wane in intensity on EKGs over the years. No change in therapy is recommended.

## 2012-08-30 NOTE — Assessment & Plan Note (Signed)
Minor and episodic increase in blood pressure today. No change in medications. Monitor and call us if her blood pressure remains consistently elevated.

## 2012-08-30 NOTE — Assessment & Plan Note (Signed)
Fair control.

## 2012-08-30 NOTE — Patient Instructions (Addendum)
Your physician has requested that you go to the basement lab work before leaving today.  We have sent the following medications to your pharmacy for you to pick up at your convenience: Omeprazole.  Follow instructions on Hemoccult cards and mail them back to [place] when finished.  You have been scheduled for an endoscopy with propofol. Please follow written instructions given to you at your visit today. If you use inhalers (even only as needed), please bring them with you on the day of your procedure. Your physician has requested that you go to www.startemmi.com and enter the access code given to you at your visit today. This web site gives a general overview about your procedure. However, you should still follow specific instructions given to you by our office regarding your preparation for the procedure.\  Thank you for choosing me and [facility].  Venita Lick. Pleas Koch., MD., Clementeen Graham  cc: Butch Penny, MD

## 2012-08-30 NOTE — Assessment & Plan Note (Signed)
Target LDL less than 70 is achieved;  would not expect significant improvement in HDL cholesterol without substantial weight loss increase physical activity. is achieved.

## 2012-08-30 NOTE — Progress Notes (Signed)
History of Present Illness: This is a 57 year old female who relates the onset of right upper quadrant pain and intermittent diarrhea beginning about 6 weeks ago. She had one episode of vomiting but none since. She underwent an abdomen/pelvis CT with results below. She took pantoprazole 40 mg twice daily for one week and her symptoms substantially improved however she noted itching and discontinued Protonix and the itching resolved. She has been eating a light, bland diet and and if she needs more she tends to have diarrhea. She has long-term lactose intolerance and avoids lactose products. She denies recent medication changes and recent antibiotic usage. She states she had blood work her primary care doctor's office was unremarkable. Denies weight loss,  constipation, change in stool caliber, melena, hematochezia, nausea, vomiting, dysphagia, reflux symptoms, chest pain.  IMPRESSION:  1. No gallstones or findings to suggest acute cholecystitis at this time.  2. However, there does appear to be some circumferential wall thickening throughout the duodenum, which may suggest a duodenitis. No gross inflammatory changes or abnormal fluid collections are noted within the retroperitoneum adjacent to the duodenum at this time to suggest complications.  3. Normal appendix.  4. Additional incidental findings, as above.  Review of Systems: Pertinent positive and negative review of systems were noted in the above HPI section. All other review of systems were otherwise negative.  Current Medications, Allergies, Past Medical History, Past Surgical History, Family History and Social History were reviewed in [employer] record.  Physical Exam: General: Well developed , well nourished, no acute distress Head: Normocephalic and atraumatic Eyes:  sclerae anicteric, EOMI Ears: Normal auditory acuity Mouth: No deformity or lesions Neck: Supple, no masses or thyromegaly Lungs: Clear throughout  to auscultation Heart: Regular rate and rhythm; no murmurs, rubs or bruits Abdomen: Soft, non tender and non distended. No masses, hepatosplenomegaly or hernias noted. Normal Bowel sounds Musculoskeletal: Symmetrical with no gross deformities  Skin: No lesions on visible extremities Pulses:  Normal pulses noted Extremities: No clubbing, cyanosis, edema or deformities noted Neurological: Alert oriented x 4, grossly nonfocal Cervical Nodes:  No significant cervical adenopathy Inguinal Nodes: No significant inguinal adenopathy Psychological:  Alert and cooperative. Normal mood and affect  Assessment and Recommendations:  1. RUQ pain and thickened duodenum. R/O ulcer, duodenitis, neoplasm. Omeprazole 40 mg po daily. Avoid ASA/NSAIDs. The risks, benefits, and alternatives to endoscopy with possible biopsy and possible dilation were discussed with the patient and they consent to proceed.   2. Diarrhea. Standard stool studies and hemoccults. May need colonoscopy to further evaluate.  3. Lactose intolerance. Avoid lactose or use Lactaid pills.

## 2012-08-30 NOTE — Progress Notes (Signed)
Patient ID: Shelley [name], female   DOB: 02-19-1955, 57 y.o.   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 She has undergone workup that showed no abnormalities of the gallbladder or appendix. At one point she was having nausea and vomiting every 2 hours as well as some diarrhea. Initially there was some concern that her stomach pain might be an angina equivalent. The symptoms are quite different from her previous angina which is left arm pain.  Her blood pressure slightly high today but recently her blood pressure was recorded as low as 92/60 and usually her blood pressure is quite good. Diabetes control has been fair with a hemoglobin A1c of 6.7%. Her last lipid profile showed excellent values except for a borderline low HDL of 38 which is a chronic problem.  She has a rich history of coronary disease. Which was only 57 years old received a bare-metal stents to the mid LAD artery. In 2008 she presented with an acute myocardial infarction and received a 3 x 24 mm drug-eluting Taxus stent to the LAD artery. Subsequently underwent staged revascularization with a drug-eluting Cypher 2.5 x 13 mm to the distal LAD artery and a 3.0 x 23 mm drug-eluting Cypher stent to the right coronary artery. She has not required repeat coronary angiography since that time. Her most recent functional study in July of 2012 was negative for ischemia, showed normal left ventricular systolic function with an ejection fraction of 67%. She has never had symptoms of congestive heart failure.    Allergies  Allergen Reactions  . Levothyroxine   . Lopressor [Metoprolol Tartrate] Cough    Fatigue   . Norvasc [Amlodipine Besylate]     Current Outpatient Prescriptions  Medication Sig Dispense Refill  . albuterol (PROAIR HFA) 108 (90 BASE) MCG/ACT inhaler Inhale 2 puffs into the  lungs every 6 (six) hours as needed. For coughing and/or wheezing      . atorvastatin (LIPITOR) 20 MG tablet Take 20 mg by mouth daily.      Marland Kitchen diltiazem (CARDIZEM CD) 360 MG 24 hr capsule Take 360 mg by mouth daily.      Marland Kitchen doxepin (SINEQUAN) 10 MG capsule Take 10 mg by mouth at bedtime.      . fexofenadine (ALLEGRA) 180 MG tablet Take 180 mg by mouth 2 (two) times daily.      Marland Kitchen ketoconazole (NIZORAL) 2 % cream Apply 1 application topically as needed. For skin irritation      . metFORMIN (GLUCOPHAGE-XR) 500 MG 24 hr tablet Take 1,000 mg by mouth 2 (two) times daily.      Marland Kitchen thyroid (ARMOUR) 15 MG tablet Take 15 mg by mouth every morning. Take one tablet with one 60mg  tablet for every morning before breakfast for a total 75mg       . thyroid (ARMOUR) 60 MG tablet Take 60 mg by mouth every morning. Take one 60mg  tablet with a 15mg  tablet for a total 75mg  every morning before breakfast      . valsartan-hydrochlorothiazide (DIOVAN-HCT) 160-12.5 MG per tablet Take 1 tablet by mouth daily.      Marland Kitchen aspirin 81 MG tablet Take 81 mg by mouth daily.      Marland Kitchen omeprazole (PRILOSEC) 40 MG capsule Take 1 capsule (40 mg total) by mouth daily.  30 capsule  11   No current facility-administered medications  for this visit.    Past Medical History  Diagnosis Date  . Hypertension   . Diabetes mellitus   . MI (myocardial infarction)   . Thyroid disease   . CAD (coronary artery disease)   . Dyslipidemia   . Cardiac arrhythmia     Past Surgical History  Procedure Laterality Date  . Back surgery  5/99 & 6/99  . Coronary angioplasty with stent placement  07/17/1998    stent LAD  . Cardiac catheterization  01/12/2002    stent patent  . Coronary angioplasty with stent placement  01/07/2006    stent RCA,LAD stent patent  . Coronary angioplasty with stent placement  03/18/2006    stent to mid RCA,prox.RCA and LAD stent patent  . Cardiac catheterization  09/03/2006    patent stents  . Thyroid surgery      2012     Family History  Problem Relation Age of Onset  . Hypertension Mother   . Thyroid disease Mother   . Esophageal cancer Maternal Aunt   . Prostate cancer Maternal Uncle     History   Social History  . Marital Status: Married    Spouse Name: N/A    Number of Children: N/A  . Years of Education: N/A   Occupational History  . EKG [employer]   Social History Main Topics  . Smoking status: Former Games developer  . Smokeless tobacco: Former Neurosurgeon    Quit date: 01/05/2005  . Alcohol Use: No  . Drug Use: No  . Sexual Activity: Not on file   Other Topics Concern  . Not on file   Social History Narrative  . No narrative on file    Review of systems: The patient specifically denies any chest pain at rest exertion, dyspnea at rest or with exertion, orthopnea, paroxysmal nocturnal dyspnea, syncope, palpitations, focal neurological deficits, intermittent claudication, lower extremity edema, unexplained weight gain, cough, hemoptysis or wheezing.   PHYSICAL EXAM BP 146/80  Pulse 75  Resp 16  Ht 5\' 5"  (1.651 m)  Wt 204 lb 11.2 oz (92.851 kg)  BMI 34.06 kg/m2  General: Alert, oriented x3, no distress Head: no evidence of trauma, PERRL, EOMI, no exophtalmos or lid lag, no myxedema, no xanthelasma; normal ears, nose and oropharynx Neck: normal jugular venous pulsations and no hepatojugular reflux; brisk carotid pulses without delay and no carotid bruits Chest: clear to auscultation, no signs of consolidation by percussion or palpation, normal fremitus, symmetrical and full respiratory excursions Cardiovascular: normal position and quality of the apical impulse, regular rhythm, normal first and second heart sounds, no murmurs, rubs or gallops Abdomen: no tenderness or distention, no masses by palpation, no abnormal pulsatility or arterial bruits, normal bowel sounds, no hepatosplenomegaly Extremities: no clubbing, cyanosis or edema; 2+ radial, ulnar and brachial pulses bilaterally;  2+ right femoral, posterior tibial and dorsalis pedis pulses; 2+ left femoral, posterior tibial and dorsalis pedis pulses; no subclavian or femoral bruits Neurological: grossly nonfocal   EKG: Sinus rhythm, T-wave inversion in leads V2 through V6 T wave flattening in leads 123 aVL aVF  Lipid Panel October 2013 total cholesterol 115, triglycerides 110, HDL 38, LDL 55    Component Value Date/Time   CHOL  Value: 126        ATP III CLASSIFICATION:  <200     mg/dL   Desirable  161-096  mg/dL   Borderline High  >=045    mg/dL   High 04/13/8117 1478   TRIG 97 09/03/2006 0115  HDL 32* 09/03/2006 0115   CHOLHDL 3.9 09/03/2006 0115   VLDL 19 09/03/2006 0115   LDLCALC  Value: 75        Total Cholesterol/HDL:CHD Risk Coronary Heart Disease Risk Table                     Men   Women  1/2 Average Risk   3.4   3.3 09/03/2006 0115    BMET    Component Value Date/Time   NA 140 04/27/2011 1704   K 3.4* 04/27/2011 1704   CL 103 04/27/2011 1704   CO2 28 04/27/2011 1704   GLUCOSE 183* 04/27/2011 1704   BUN 10 04/27/2011 1704   CREATININE 0.76 04/27/2011 1704   CALCIUM 9.5 04/27/2011 1704   GFRNONAA >90 04/27/2011 1704   GFRAA >90 04/27/2011 1704     ASSESSMENT AND PLAN CAD (coronary artery disease)  No symptoms to suggest angina pectoris. Coronary risk factors are for the most part well addressed. She remains moderately obese. Her electrocardiographic changes are old and have been seen to wax and wane in intensity on EKGs over the years. No change in therapy is recommended.  Hyperlipidemia Target LDL less than 70 is achieved;  would not expect significant improvement in HDL cholesterol without substantial weight loss increase physical activity. is achieved.  HTN (hypertension) Minor and episodic increase in blood pressure today. No change in medications. Monitor and call us if her blood pressure remains consistently elevated.  Diabetes Fair control   Orders Placed This Encounter  Procedures  . Comp Met  (CMET)  . Lipid Profile   No orders of the defined types were placed in this encounter.    Junious Silk, MD, [facility] [phone] office [phone] pager

## 2012-09-06 ENCOUNTER — Other Ambulatory Visit: Payer: 59

## 2012-09-06 DIAGNOSIS — R197 Diarrhea, unspecified: Secondary | ICD-10-CM

## 2012-09-07 LAB — GIARDIA ANTIGEN: Giardia Screen (EIA): NEGATIVE

## 2012-09-08 ENCOUNTER — Other Ambulatory Visit (INDEPENDENT_AMBULATORY_CARE_PROVIDER_SITE_OTHER): Payer: 59

## 2012-09-08 DIAGNOSIS — R197 Diarrhea, unspecified: Secondary | ICD-10-CM

## 2012-09-08 LAB — HEMOCCULT SLIDES (X 3 CARDS)
OCCULT 1: NEGATIVE
OCCULT 2: NEGATIVE
OCCULT 3: NEGATIVE
OCCULT 4: NEGATIVE

## 2012-09-10 LAB — STOOL CULTURE

## 2012-09-12 ENCOUNTER — Ambulatory Visit (INDEPENDENT_AMBULATORY_CARE_PROVIDER_SITE_OTHER): Payer: Self-pay | Admitting: Family Medicine

## 2012-09-12 VITALS — BP 124/72 | HR 89 | Ht 65.0 in | Wt 203.0 lb

## 2012-09-12 DIAGNOSIS — E119 Type 2 diabetes mellitus without complications: Secondary | ICD-10-CM

## 2012-09-12 NOTE — Progress Notes (Signed)
Subjective:  Patient presents today for 3 month diabetes follow-up as part of the employer-sponsored Link to Wellness program. Current diabetes regimen includes metformin 1000 mg ER BID. Patient also continues on daily ARB and statin. Most recent MD follow-up was in July with Dr. Talmage Nap. She has an appointment pending with Dr. Talmage Nap in October. Her last A1C with me was 7.1% in June.  Patient reports that she had a good summer. She started having stomach pain for the past 4-6 weeks. She has met with a GI doctor after she had symptoms of diarrhea, vomitting and stomach pain. CT scan showed that it was not her gallbladder but it did show some inflammation. She has an appointment pending for an endoscopy.   Since having the stomach pain her doctor has discontinued ASA for the time being.       Disease Assessments:  Diabetes:  takes medications as prescribed; takes an aspirin a day; Type of Diabetes: Type 2; Sees Diabetes provider 2 times per year; MD managing Diabetes Balan; checks blood glucose once daily; checks feet daily; uses glucometer;   Highest CBG 146; Lowest CBG 80; hypoglycemia frequency rarely; 14 day CBG average 106; Other Diabetes History: Most checks are first thing in the morning and are around 100-110. She denies hypoglycemia.     Physical Activity-  She is bowling two nights a week and went to several tournaments over the summer. She has been walking more in the hospital. Since she has had the stomach pain she isn't walking at home as much. She isn't using the stationary bike.   Nutrition-   She has been having stomach pain and she has changed her diet somewhat. She has cut out dairy and high fat foods. She reports that she is eating a bland diet and is eating a lot of soup. She was told to cut out tea but she hasn't been able to cut it out. She is still drinking a lot of water.       Hemoglobin A1c: 09/12/2012 6.9    Objective:  Musculoskeletal: feet and toes normal. Other  foot findings She had an ingrown toenail at her last appointment with me, but that has since resolved.   Assessment/Plan:  Patient is a 57 year old female with DM2. A1C today is 6.9% which is meeting goal of less than 7%. Patient seemed pleased with the result but wishes that it could be lower. I explained that at 6.9% she was doing really well. At this point I would not advise adding any medications to lower her A1C, but that increased physical activity would help lower her blood sugars and also A1C. Patient agreed to increase physical activity.   I will fax A1C results to Dr. Talmage Nap and Dr. Renard Matter. At next visit follow up with patient regarding stomach pain and N/V/D issues..    Goals for Next Visit-  1. Increase physical activity to the 3 extra days of walking at home for 20-30 minutes.  2. Follow up with your GI doctor and your endoscopy to figure out what is causing the stomach pain.   Next appointment with me is Monday December 8th at 3:30 PM.

## 2012-09-19 ENCOUNTER — Ambulatory Visit (AMBULATORY_SURGERY_CENTER): Payer: 59 | Admitting: Gastroenterology

## 2012-09-19 ENCOUNTER — Encounter: Payer: Self-pay | Admitting: Gastroenterology

## 2012-09-19 ENCOUNTER — Other Ambulatory Visit: Payer: Self-pay | Admitting: *Deleted

## 2012-09-19 VITALS — BP 122/71 | HR 75 | Temp 96.9°F | Resp 16 | Ht 65.0 in | Wt 203.0 lb

## 2012-09-19 DIAGNOSIS — R1011 Right upper quadrant pain: Secondary | ICD-10-CM

## 2012-09-19 DIAGNOSIS — D131 Benign neoplasm of stomach: Secondary | ICD-10-CM

## 2012-09-19 DIAGNOSIS — R933 Abnormal findings on diagnostic imaging of other parts of digestive tract: Secondary | ICD-10-CM

## 2012-09-19 LAB — GLUCOSE, CAPILLARY: Glucose-Capillary: 100 mg/dL — ABNORMAL HIGH (ref 70–99)

## 2012-09-19 MED ORDER — DEXTROSE 5 % IV SOLN: INTRAVENOUS | Status: DC

## 2012-09-19 MED ORDER — ATORVASTATIN CALCIUM 20 MG PO TABS: 20.0000 mg | ORAL_TABLET | Freq: Every day | ORAL | Status: DC

## 2012-09-19 MED ORDER — HYOSCYAMINE SULFATE 0.125 MG SL SUBL: 0.1250 mg | SUBLINGUAL_TABLET | Freq: Three times a day (TID) | SUBLINGUAL | Status: DC

## 2012-09-19 NOTE — Progress Notes (Signed)
No complaints noted in the recovery room. Maw   

## 2012-09-19 NOTE — Op Note (Signed)
[facility] [address] [place], [address]   ENDOSCOPY PROCEDURE REPORT  PATIENT: Shelley, [name]  MR#: [id number] BIRTHDATE: [birth year] , 56  yrs. old GENDER: Female ENDOSCOPIST: Meryl Dare, MD, Clementeen Graham REFERRED BY:  Butch Penny, M.D. PROCEDURE DATE:  09/19/2012 PROCEDURE:  EGD w/ biopsy ASA CLASS:     Class II INDICATIONS:  abdominal pain in the upper right quadrant.  abnormal CT of the GI tract. MEDICATIONS: MAC sedation, administered by CRNA and propofol (Diprivan) 150mg  IV TOPICAL ANESTHETIC: Cetacaine Spray DESCRIPTION OF PROCEDURE: After the risks benefits and alternatives of the procedure were thoroughly explained, informed consent was obtained.  The [id number] endoscope was introduced through the mouth and advanced to the second portion of the duodenum without limitations.  The instrument was slowly withdrawn as the mucosa was fully examined.  STOMACH: Mild gastritis (erythema) was found in the gastric body and gastric antrum.  Multiple biopsies were performed.   The stomach otherwise appeared normal. ESOPHAGUS: The mucosa of the esophagus appeared normal. DUODENUM: The duodenal mucosa showed no abnormalities in the bulb and second portion of the duodenum. Retroflexed views revealed a small hiatal hernia.   The scope was then withdrawn from the patient and the procedure completed.  COMPLICATIONS: There were no complications.  ENDOSCOPIC IMPRESSION: 1.   Gastritis in the gastric body and gastric antrum; multiple biopsies 2.   Small hiatal hernia  RECOMMENDATIONS: 1.  Anti-reflux regimen 2.  Await pathology results 3.  Continue PPI   eSigned:  Meryl Dare, MD, [facility] 09/19/2012 10:35 AM

## 2012-09-19 NOTE — Progress Notes (Signed)
  [facility] Anesthesia Post-op Note  Patient: Shelley [name]  Procedure(s) Performed: endoscopy  Patient Location: [facility] - Recovery Area  Anesthesia Type: Deep Sedation/Propofol  Level of Consciousness: awake, oriented and patient cooperative  Airway and Oxygen Therapy: Patient Spontanous Breathing  Post-op Pain: none  Post-op Assessment:  Post-op Vital signs reviewed, Patient's Cardiovascular Status Stable, Respiratory Function Stable, Patent Airway, No signs of Nausea or vomiting and Pain level controlled  Post-op Vital Signs: Reviewed and stable  Complications: No apparent anesthesia complications  Terrin Meddaugh E 10:40 AM

## 2012-09-19 NOTE — Patient Instructions (Addendum)

## 2012-09-19 NOTE — Progress Notes (Signed)
Called to room to assist during endoscopic procedure.  Patient ID and intended procedure confirmed with present staff. Received instructions for my participation in the procedure from the performing physician.  

## 2012-09-20 ENCOUNTER — Telehealth: Payer: Self-pay

## 2012-09-20 NOTE — Telephone Encounter (Signed)
  Follow up Call-  Call back number 09/19/2012  Post procedure Call Back phone  # [phone]  Permission to leave phone message Yes     Patient questions:  Do you have a fever, pain , or abdominal swelling? no Pain Score  0 *  Have you tolerated food without any problems? yes  Have you been able to return to your normal activities? yes  Do you have any questions about your discharge instructions: Diet   no Medications  no Follow up visit  no  Do you have questions or concerns about your Care? no  Actions: * If pain score is 4 or above: No action needed, pain <4.  No problems per the pt. Maw

## 2012-09-23 ENCOUNTER — Encounter: Payer: Self-pay | Admitting: Gastroenterology

## 2012-11-03 LAB — LIPID PANEL
HDL: 41 mg/dL (ref >39)
LDL Cholesterol: 111 mg/dL — ABNORMAL HIGH (ref 0–99)
Total CHOL/HDL Ratio: 4.2 Ratio
VLDL: 19 mg/dL (ref 0–40)

## 2012-11-03 LAB — COMPREHENSIVE METABOLIC PANEL
ALT: 14 U/L (ref 0–35)
AST: 15 U/L (ref 0–37)
Alkaline Phosphatase: 134 U/L — ABNORMAL HIGH (ref 39–117)
Calcium: 9.7 mg/dL (ref 8.4–10.5)
Chloride: 104 mEq/L (ref 96–112)
Creat: 0.75 mg/dL (ref 0.50–1.10)

## 2012-11-09 ENCOUNTER — Telehealth: Payer: Self-pay | Admitting: Cardiovascular Disease

## 2012-11-09 DIAGNOSIS — E782 Mixed hyperlipidemia: Secondary | ICD-10-CM

## 2012-11-09 DIAGNOSIS — Z79899 Other long term (current) drug therapy: Secondary | ICD-10-CM

## 2012-11-09 NOTE — Telephone Encounter (Signed)
Lab results not reviewed by MD.  Forwarded to Britta Mccreedy, CMA in case she called pt.

## 2012-11-09 NOTE — Telephone Encounter (Signed)
Returning call from someone here  Believes it was about her lab results Please call

## 2012-11-10 ENCOUNTER — Other Ambulatory Visit: Payer: Self-pay

## 2012-11-10 MED ORDER — ATORVASTATIN CALCIUM 40 MG PO TABS: 40.0000 mg | ORAL_TABLET | Freq: Every day | ORAL | Status: DC

## 2012-11-10 NOTE — Telephone Encounter (Signed)
Patient notified to increase Atorvastatin to 40mg  qd and have labs rechecked in 3 months.  Patient voiced understanding.

## 2012-11-10 NOTE — Telephone Encounter (Signed)
Message copied by Vita Barley on Thu Nov 10, 2012 11:57 AM ------      Message from: Thurmon Fair      Created: Wed Nov 09, 2012  4:50 PM       Please ask her to increase the atorvastatin to 40 mg daily and we will recheck lipids in 3 months. Please let her know that the 40 mg dose should not cause any more side effects than the 20 mg dose. ------

## 2012-11-11 ENCOUNTER — Telehealth: Payer: Self-pay | Admitting: Cardiovascular Disease

## 2012-11-11 MED ORDER — DILTIAZEM HCL ER COATED BEADS 360 MG PO CP24: 360.0000 mg | ORAL_CAPSULE | Freq: Every day | ORAL | Status: DC

## 2012-11-11 NOTE — Telephone Encounter (Signed)
Need a refill on Taztia XT 180 mg #180

## 2012-11-11 NOTE — Telephone Encounter (Signed)
Paper chart requested.

## 2012-11-11 NOTE — Telephone Encounter (Signed)
Still have not gotten her Tiazac-Been waiting every since Monday.

## 2012-12-12 ENCOUNTER — Ambulatory Visit (INDEPENDENT_AMBULATORY_CARE_PROVIDER_SITE_OTHER): Payer: Self-pay | Admitting: Family Medicine

## 2012-12-12 VITALS — BP 154/96 | HR 84 | Ht 65.0 in | Wt 204.0 lb

## 2012-12-12 DIAGNOSIS — E119 Type 2 diabetes mellitus without complications: Secondary | ICD-10-CM

## 2012-12-12 NOTE — Progress Notes (Signed)
Subjective:  She is taking metformin 500 mg 2 tablets twice daily. She reports that if she is having pain in her stomach or cramping, she skips taking the metformin.   Patient presents today for 3 month diabetes follow-up as part of the employer-sponsored Link to Wellness program. Current diabetes regimen includes metformin 500 mg ER 2 tablets twice daiyl. Patient also continues on daily statin and ARB.   Patient states that she had an evaluation with the GI specialist for GERD symptoms. She had an endoscopy and several biopsies performed and she was diagnosed with gastritis. She was given a list of foods to avoid, including most meats, fried foods, orange juice, gas producing vegetables like broccoli, cauliflower and lettuce. She states that if she avoids those foods it helps.   Last appointment with Dr. Talmage Nap was in November. She states that her A1C was 6.9%. Thyroid dose was reduced- 75 mg once daily except 60 mg Friday, Saturday and Sunday.   She has had an ingrown toenail removed with the podiatrist in September. She reports that it has healed well.    Disease Assessments:  Diabetes:  takes medications as prescribed; takes an aspirin a day; Type of Diabetes: Type 2; Sees Diabetes provider 2 times per year; MD managing Diabetes Balan; checks blood glucose once daily; checks feet daily; uses glucometer; Highest CBG 135; Lowest CBG 84; hypoglycemia frequency rarely;   7 day CBG average 114; 14 day CBG average 111; 30 day CBG average 107; Other Diabetes History: Most checks are first thing in the morning and are around 80-120. She denies hypoglycemia.     Physical Activity-  She got a fit bit on a Black Friday sale and she has been wearing it daily. She has it synced to her laptop at home and she is hoping to be able to sync it to the wellness website. She states that she has been trying to achieve 10,000 steps a day. The most she has had is 8,000 steps.   She is still bowling twice a week. She  uses the exercise bike two times a week for 15 minutes. She also reports that she is trying to walk extra during the day when she is working. She also walks some during the week at the [employer].   Nutrition-   Her diet has been cut back significantly following diagnoses of gastritis.   She is eating the following foods- soups, oatmeal, cereal, grilled cheese sandwich, [place] or ham sandwich, boiled/broiled chicken and rice. She can eat some vegetables. She reports that overall she is eating smaller portions. She is avoiding fried foods.   She reports that she hasn't lost much weight but noticed her clothes are fitting better. She has also had to wear a belt with some pants.   :    Hemoglobin A1c: 11/07/2012 Via Dr. Willeen Cass office 6.9    Dilated Eye Exam: 07/05/2010  Flu vaccine: 09/05/2012  Other Preventive Care Notes: Patient has dentures.       Overall Health Assessments:  Vision:  Dilated Eye Exam: 07/05/2010      Vital Signs:  12/12/2012 3:56 PM (EST) Blood Pressure 154 / 96 mm/HgBMI 33.9; Height 5 ft 5 in; Pulse Rate 84 bpm; Weight 204 lbs  12/12/2012 3:54 PM (EST) (Home Monitoring) Blood Pressure She reports she has a BP cuff at home and she checks occasionally at home.    Testing:  Blood Sugar Tests:  Hemoglobin A1c: 6.9 via Dr. Willeen Cass office November 2014  Assessment/Plan: Patient is a 57 year female with DM2. Most recent A1C in November was unchanged at 6.9% which is meeting goal of A1C less than 7%. Patient continues to have good glycemic control. She reports no hypoglycemia and most fasting measurements are between 80-120.   She reports that the stomach pain has improved since her last appointment with me. She is managing this with omeprazole 40 mg once daily and dietary restrictions. She reports that she is eating smaller portions now and is avoiding almost all fried foods. Patient requested a referral to an RD today, so I initiated a referral to see a RD  with the [facility].   Patient has increased physical activity since her last appointment with me. She is walking more frequently and also using an exercise bike. She is frustrated that she hasn't lost weight, but she has noticed her clothes are fitting more loosely. Patient also has a fitbit that she is using and she reports this has helped with her motivation. I encouraged her to continue exercising even if she didn't see a weight change. I pointed out that compared to 2013 she has lost 8 pounds and maintained this loss.   Follow up with patient in 3 months..    Goals for Next Visit-  1. Sync your fitbit with the Wellness App and figure out what classifies as a "workout". 2. Make an appointment to get your eyes checked. 3. Around the holidays make sure that you are still making physical activity a priority and you are using the fitbit to stay motivated.  Next appointment to see me is Monday March 9th at 3:30 PM.

## 2013-03-13 ENCOUNTER — Ambulatory Visit (INDEPENDENT_AMBULATORY_CARE_PROVIDER_SITE_OTHER): Payer: Self-pay | Admitting: Family Medicine

## 2013-03-13 VITALS — BP 116/76 | HR 90 | Ht 65.0 in | Wt 205.0 lb

## 2013-03-13 DIAGNOSIS — E119 Type 2 diabetes mellitus without complications: Secondary | ICD-10-CM

## 2013-03-13 NOTE — Progress Notes (Signed)
Subjective:  Patient presents today for 3 month diabetes follow-up as part of the employer-sponsored Link to Wellness program. Current diabetes regimen includes metformin 500 mg ER 2 tablets twice daily. Patient also continues on daily ARB and statin. She has a pending appointment to see Dr. Chalmers Cater next month.   Patient reports that things have been going about the same as she was before. She says she is struggling with her numbers but she doesn't think that she is doing anything differently than she was before. She wonders if taking atorvastatin has worsened her glycemic control. She previously was taking rosuvastatin and was switched to atorvastatin because it was less expensive.    Disease Assessments:  Diabetes:  takes medications as prescribed; takes an aspirin a day; Type of Diabetes: Type 2; Sees Diabetes provider 2 times per year; MD managing Diabetes Balan; checks blood glucose once daily; checks feet daily; uses glucometer;   Highest CBG 134; Lowest CBG 91; hypoglycemia frequency rarely; 7 day CBG average 118; 14 day CBG average 117; 30 day CBG average 114; Other Diabetes History: Most checks are first thing in the morning and are around 80-120. She denies hypoglycemia.      Tobacco Assessment:  Smoking Status: Former smoker; Last Reviewed: 03/13/2013  Pack-years: 30; smoked for 30; Date quit smoking: 2008    Physical Activity-  Still using the fitbit daily. She is averaging up to 8-9K on the days that she bowls on Tuesday/Thursday. She hit 10K one time. Non bowling days she is up to 4-6K.   Nutrition-   She continues to have gastritis, but this has been managed with omeprazole.   She is eating salads for lunches. She is eating mixed lettuce, brocolli, raw spinach, boiled egg, beets, red beans, sunflower seeds, celery, sometimes bacon bits, sometimes chicken or tuna.   She reports that she is often hungry after lunch and that the salad doesn't really hold her over.   She has been  limiting rice and pasta.     Vital Signs:  03/13/2013 4:43 PM (EST) Blood Pressure 116 / 79 mm/HgBMI 34.1; Height 5 ft 5 in; Pulse Rate 90 bpm; Weight 205 lbs  03/13/2013 3:49 PM (EST) BMI 34.1; Height 5 ft 5 in; Weight 205 lbs    Testing:  Blood Sugar Tests:  Hemoglobin A1c: 6.8 via POCT resulted on 03/13/2013    Assessment/Plan: Patient is a 58 year old female with DM2. A1C today was 6.8% which is meeting goal of less than 7%. Patient has been worried because CBGs are slightly increased from where they were a month two ago. Fasting CBGs are now in the 100-110s instead of 90-100s. I explained to patient that this was still meeting the fasting goal of 80-120 mg/dL and that her A1C continued to meet goal.   Patient is following a low carbohydrate diet. She is eating salads for lunch, but she is probably getting less than 15 g of carbohydrates with lunch. She reports that she doesn't feel full after eating lunch and is hungry. I recommended increasing kidney beans to 1/2 cup on her salad so that she could have the additional protein and carbohydrates.   Patient is still bowling twice a week but is not doing any additional physical activity. I explained that adding a few days of walking during the week could help improve CBGs and overall glycemic control. Patient agreed to start walking around her neighborhood and will try and reach 8000 steps on her fitbit on the days that she  isn't bowling.   I will fax A1C results to Dr. Chalmers Cater. Follow up with patient in 5 months when I return from maternity leave..    Goals for Next Visit-  1. To qualify for the healthy weight goal under [email or web address], you can make an appointment to see the dietitian: Call our [place] at [phone]. 2. When you eat your salads, increase the amount of kidney beans- 1/2 cup of canned kidney beans is 14 grams of carbohydrates.  3. On the days that you don't bowl,  try to get up to 8000 steps. Ways to do this- extra walking around the hospital, around your neighborhood, at the mall.   Next appointment to see me is Monday August 3rd at 3:30 PM.

## 2013-03-14 ENCOUNTER — Other Ambulatory Visit: Payer: Self-pay

## 2013-03-14 MED ORDER — VALSARTAN-HYDROCHLOROTHIAZIDE 160-12.5 MG PO TABS: 1.0000 | ORAL_TABLET | Freq: Every day | ORAL | Status: DC

## 2013-03-14 NOTE — Telephone Encounter (Signed)
Rx was sent to pharmacy electronically. 

## 2013-03-14 NOTE — Progress Notes (Signed)
Patient ID: Shelley [name], female   DOB: 02-10-1955, 58 y.o.   MRN: [id number] ATTENDING PHYSICIAN NOTE: I have reviewed the chart and agree with the plan as detailed above. Dorcas Mcmurray MD Pager [phone]

## 2013-03-14 NOTE — Progress Notes (Signed)
Patient ID: [name], female   DOB: 03/16/1955, 57 y.o.   MRN: [id number] ATTENDING PHYSICIAN NOTE: I have reviewed the chart and agree with the plan as detailed above. Saadia Dewitt MD Pager [phone]  

## 2013-04-26 ENCOUNTER — Telehealth: Payer: Self-pay | Admitting: Cardiology

## 2013-04-26 ENCOUNTER — Telehealth: Payer: Self-pay | Admitting: *Deleted

## 2013-04-26 DIAGNOSIS — E785 Hyperlipidemia, unspecified: Secondary | ICD-10-CM

## 2013-04-26 DIAGNOSIS — I2510 Atherosclerotic heart disease of native coronary artery without angina pectoris: Secondary | ICD-10-CM

## 2013-04-26 MED ORDER — ROSUVASTATIN CALCIUM 20 MG PO TABS: 20.0000 mg | ORAL_TABLET | Freq: Every day | ORAL | Status: DC

## 2013-04-26 NOTE — Telephone Encounter (Signed)
Patient notified Cecilie Kicks, NP look at her recent labs and her statin will be switched to crestor 20mg  daily and to get labs rechecked in 3 months.  Rx sent to pharmacy electronically.  Patient voiced understanding.

## 2013-04-26 NOTE — Telephone Encounter (Signed)
Shelley [name] called with the decision to change the lipitor to Crestor which she has responded well with.  The lipitor is not  Controlling her LDL, at 40 mg her LDL is 109 and goal is 70.  We will change to Crestor at 20 mg and recheck in 3 months her lipids and hepatic panel.

## 2013-08-07 ENCOUNTER — Encounter: Payer: Self-pay | Admitting: Family Medicine

## 2013-08-07 NOTE — Progress Notes (Signed)
Patient ID: Shelley [name], female   DOB: 1955-12-17, 58 y.o.   MRN: [id number] Reviewed: Agree with our Pharmacologist's documentation and management.

## 2013-08-23 ENCOUNTER — Other Ambulatory Visit: Payer: Self-pay | Admitting: Cardiovascular Disease

## 2013-08-23 LAB — LIPID PANEL
CHOLESTEROL: 197 mg/dL (ref 0–200)
HDL: 43 mg/dL (ref >39)
LDL Cholesterol: 128 mg/dL — ABNORMAL HIGH (ref 0–99)
TRIGLYCERIDES: 130 mg/dL (ref <150)
Total CHOL/HDL Ratio: 4.6 Ratio
VLDL: 26 mg/dL (ref 0–40)

## 2013-08-23 LAB — COMPREHENSIVE METABOLIC PANEL
ALBUMIN: 4.4 g/dL (ref 3.5–5.2)
ALT: 32 U/L (ref 0–35)
AST: 31 U/L (ref 0–37)
Alkaline Phosphatase: 140 U/L — ABNORMAL HIGH (ref 39–117)
BILIRUBIN TOTAL: 0.4 mg/dL (ref 0.2–1.2)
BUN: 10 mg/dL (ref 6–23)
CO2: 31 meq/L (ref 19–32)
Calcium: 8.9 mg/dL (ref 8.4–10.5)
Chloride: 101 mEq/L (ref 96–112)
Creat: 0.72 mg/dL (ref 0.50–1.10)
GLUCOSE: 131 mg/dL — AB (ref 70–99)
Potassium: 3.6 mEq/L (ref 3.5–5.3)
SODIUM: 141 meq/L (ref 135–145)
Total Protein: 7.6 g/dL (ref 6.0–8.3)

## 2013-08-25 ENCOUNTER — Encounter: Payer: Self-pay | Admitting: Cardiovascular Disease

## 2013-08-25 ENCOUNTER — Ambulatory Visit (INDEPENDENT_AMBULATORY_CARE_PROVIDER_SITE_OTHER): Payer: 59 | Admitting: Cardiovascular Disease

## 2013-08-25 VITALS — BP 114/74 | HR 87 | Resp 16 | Ht 65.0 in | Wt 210.1 lb

## 2013-08-25 DIAGNOSIS — E669 Obesity, unspecified: Secondary | ICD-10-CM

## 2013-08-25 DIAGNOSIS — I2510 Atherosclerotic heart disease of native coronary artery without angina pectoris: Secondary | ICD-10-CM

## 2013-08-25 DIAGNOSIS — I10 Essential (primary) hypertension: Secondary | ICD-10-CM

## 2013-08-25 DIAGNOSIS — Z79899 Other long term (current) drug therapy: Secondary | ICD-10-CM

## 2013-08-25 DIAGNOSIS — E785 Hyperlipidemia, unspecified: Secondary | ICD-10-CM

## 2013-08-25 DIAGNOSIS — E782 Mixed hyperlipidemia: Secondary | ICD-10-CM

## 2013-08-25 MED ORDER — EZETIMIBE 10 MG PO TABS: 10.0000 mg | ORAL_TABLET | Freq: Every day | ORAL | Status: DC

## 2013-08-25 NOTE — Patient Instructions (Signed)
Start Zetia 10mg  daily.  Your physician recommends that you return for lab work in: 3 months.  Dr. Sallyanne Kuster recommends that you schedule a follow-up appointment in: One year.

## 2013-08-27 ENCOUNTER — Encounter: Payer: Self-pay | Admitting: Cardiovascular Disease

## 2013-08-27 NOTE — Progress Notes (Signed)
Patient ID: Shelley [name], female   DOB: Jun 17, 1955, 58 y.o.   MRN: [id number]     Reason for office visit Coronary artery disease followup   Shelley [name] has had no complaints since her last appointment one year ago. She specifically denies chest discomfort or dyspnea at rest or with exertion myalgia, palpitations, syncope, edema, symptoms of stroke or intermittent claudication.  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 She has a rich history of coronary disease. In 2001, when she was only 58 years old she received a bare-metal stent to the mid LAD artery. In 2008 she presented with an acute myocardial infarction and received a 3 x 24 mm drug-eluting Taxus stent to the LAD artery. Subsequently underwent staged revascularization with a drug-eluting Cypher 2.5 x 13 mm to the distal LAD artery and a 3.0 x 23 mm drug-eluting Cypher stent to the right coronary artery. She has not required repeat coronary angiography since that time. Her most recent functional study in July of 2012 was negative for ischemia, showed normal left ventricular systolic function with an ejection fraction of 67%. She has never had symptoms of congestive heart failure.    Allergies  Allergen Reactions  . Levothyroxine   . Lopressor [Metoprolol Tartrate] Cough    Fatigue   . Norvasc [Amlodipine Besylate]     Current Outpatient Prescriptions  Medication Sig Dispense Refill  . albuterol (PROAIR HFA) 108 (90 BASE) MCG/ACT inhaler Inhale 2 puffs into the lungs every 6 (six) hours as needed. For coughing and/or wheezing      . diltiazem (CARDIZEM CD) 360 MG 24 hr capsule Take 1 capsule (360 mg total) by mouth daily.  90 capsule  2  . doxepin (SINEQUAN) 10 MG capsule Take 10 mg by mouth at bedtime.      . fexofenadine (ALLEGRA) 180 MG tablet Take 180 mg by mouth daily.       .  hyoscyamine (LEVSIN SL) 0.125 MG SL tablet Place 1 tablet (0.125 mg total) under the tongue 4 (four) times daily -  before meals and at bedtime.  30 tablet  1  . ketoconazole (NIZORAL) 2 % cream Apply 1 application topically as needed. For skin irritation      . metFORMIN (GLUCOPHAGE-XR) 500 MG 24 hr tablet Take 1,000 mg by mouth 2 (two) times daily.      Marland Kitchen omeprazole (PRILOSEC) 40 MG capsule Take 1 capsule (40 mg total) by mouth daily.  30 capsule  11  . rosuvastatin (CRESTOR) 20 MG tablet Take 1 tablet (20 mg total) by mouth daily.  30 tablet  6  . thyroid (ARMOUR) 15 MG tablet Take 15 mg by mouth every morning. Take one tablet with one 60mg  tablet for every morning before breakfast for a total 75mg  days Monday-Thursday      . thyroid (ARMOUR) 60 MG tablet Take 60 mg by mouth every morning. Take one 60mg  tablet with a 15mg  tablet for a total 75mg  every morning before breakfast      . valsartan-hydrochlorothiazide (DIOVAN-HCT) 160-12.5 MG per tablet Take 1 tablet by mouth daily.  90 tablet  1  . ezetimibe (ZETIA) 10 MG tablet Take 1 tablet (10 mg total) by mouth daily.  90 tablet  3   No current facility-administered medications for this visit.    Past Medical History  Diagnosis  Date  . Hypertension   . Diabetes mellitus   . MI (myocardial infarction)   . Thyroid disease   . CAD (coronary artery disease)   . Dyslipidemia   . Cardiac arrhythmia     Past Surgical History  Procedure Laterality Date  . Back surgery  5/99 & 6/99  . Coronary angioplasty with stent placement  07/17/1998    stent LAD  . Cardiac catheterization  01/12/2002    stent patent  . Coronary angioplasty with stent placement  01/07/2006    stent RCA,LAD stent patent  . Coronary angioplasty with stent placement  03/18/2006    stent to mid RCA,prox.RCA and LAD stent patent  . Cardiac catheterization  09/03/2006    patent stents  . Thyroid surgery      PT. DENIES/RADIATION    Family History  Problem Relation Age of  Onset  . Hypertension Mother   . Thyroid disease Mother   . Esophageal cancer Maternal Aunt   . Prostate cancer Maternal Uncle     History   Social History  . Marital Status: Married    Spouse Name: N/A    Number of Children: N/A  . Years of Education: N/A   Occupational History  . EKG [employer]   Social History Main Topics  . Smoking status: Former Research scientist (life sciences)  . Smokeless tobacco: Former Systems developer    Quit date: 01/05/2005  . Alcohol Use: No  . Drug Use: No  . Sexual Activity: Not on file   Other Topics Concern  . Not on file   Social History Narrative  . No narrative on file    Review of systems: The patient specifically denies any chest pain at rest or with exertion, dyspnea at rest or with exertion, orthopnea, paroxysmal nocturnal dyspnea, syncope, palpitations, focal neurological deficits, intermittent claudication, lower extremity edema, unexplained weight gain, cough, hemoptysis or wheezing.  The patient also denies abdominal pain, nausea, vomiting, dysphagia, diarrhea, constipation, polyuria, polydipsia, dysuria, hematuria, frequency, urgency, abnormal bleeding or bruising, fever, chills, unexpected weight changes, mood swings, change in skin or hair texture, change in voice quality, auditory or visual problems, allergic reactions or rashes, new musculoskeletal complaints other than usual "aches and pains".   PHYSICAL EXAM BP 114/74  Pulse 87  Resp 16  Ht 5\' 5"  (1.651 m)  Wt 210 lb 1.6 oz (95.301 kg)  BMI 34.96 kg/m2  General: Alert, oriented x3, no distress Head: no evidence of trauma, PERRL, EOMI, no exophtalmos or lid lag, no myxedema, no xanthelasma; normal ears, nose and oropharynx Neck: normal jugular venous pulsations and no hepatojugular reflux; brisk carotid pulses without delay and no carotid bruits Chest: clear to auscultation, no signs of consolidation by percussion or palpation, normal fremitus, symmetrical and full respiratory  excursions Cardiovascular: normal position and quality of the apical impulse, regular rhythm, normal first and second heart sounds, no murmurs, rubs or gallops Abdomen: no tenderness or distention, no masses by palpation, no abnormal pulsatility or arterial bruits, normal bowel sounds, no hepatosplenomegaly Extremities: no clubbing, cyanosis or edema; 2+ radial, ulnar and brachial pulses bilaterally; 2+ right femoral, posterior tibial and dorsalis pedis pulses; 2+ left femoral, posterior tibial and dorsalis pedis pulses; no subclavian or femoral bruits Neurological: grossly nonfocal   EKG: Almost sinus rhythm with a single PVC, nonspecific T wave changes (anterior inversion, actually better than on previous tracings)  Lipid Panel     Component Value Date/Time   CHOL 197 08/23/2013 1400   TRIG 130 08/23/2013  1400   HDL 43 08/23/2013 1400   CHOLHDL 4.6 08/23/2013 1400   VLDL 26 08/23/2013 1400   LDLCALC 128* 08/23/2013 1400    BMET    Component Value Date/Time   NA 141 08/23/2013 1400   K 3.6 08/23/2013 1400   CL 101 08/23/2013 1400   CO2 31 08/23/2013 1400   GLUCOSE 131* 08/23/2013 1400   BUN 10 08/23/2013 1400   CREATININE 0.72 08/23/2013 1400   CREATININE 0.76 04/27/2011 1704   CALCIUM 8.9 08/23/2013 1400   GFRNONAA >90 04/27/2011 1704   GFRAA >90 04/27/2011 1704     ASSESSMENT AND PLAN CAD (coronary artery disease)  No symptoms to suggest angina pectoris. Her electrocardiographic changes are old and have been seen to wax and wane in intensity on EKGs over the years. No change in therapy is recommended.  Hyperlipidemia  Target LDL less than 70. Will add Zetia. Would not expect significant improvement in HDL cholesterol without substantial weight loss increase physical activity. is achieved.  HTN (hypertension)  Excellent blood pressure today. No change in medications.  Diabetes  She reports fair control  Orders Placed This Encounter  Procedures  . Comprehensive metabolic panel  .  Lipid panel  . EKG 12-Lead   Meds ordered this encounter  Medications  . ezetimibe (ZETIA) 10 MG tablet    Sig: Take 1 tablet (10 mg total) by mouth daily.    Dispense:  90 tablet    Refill:  [place] Dillion Stowers, MD, [facility] [phone] office [phone] pager

## 2013-09-04 ENCOUNTER — Ambulatory Visit (INDEPENDENT_AMBULATORY_CARE_PROVIDER_SITE_OTHER): Payer: Self-pay | Admitting: Family Medicine

## 2013-09-04 VITALS — BP 118/76 | Ht 65.0 in | Wt 206.6 lb

## 2013-09-04 DIAGNOSIS — E119 Type 2 diabetes mellitus without complications: Secondary | ICD-10-CM

## 2013-09-04 DIAGNOSIS — E1165 Type 2 diabetes mellitus with hyperglycemia: Secondary | ICD-10-CM

## 2013-09-04 NOTE — Progress Notes (Signed)
Subjective:  Patient presents today for 3 month diabetes follow-up as part of the employer-sponsored Link to Wellness program. Current diabetes regimen includes metformin 500 mg ER 2 tablets twice daily. Patient also continues on daily ARB and statin. Patient states that she just doesn't feel good lately and she thinks that it is because of the metformin she is taking. She has daily diarrhea while taking the metformin. She is having loose stools 4-5 times daily when she takes the metformin. She saw a GI doc earlier this year for stomach cramping and she was placed on omeprazole. There are still some foods that she cannot eat because of diarrhea. Zetia was added since her last visit with me. Lipitor was changed to Crestor because lipid panel detoriated. Her next visit with Dr. Chalmers Cater is in October.   Disease Assessments:  Diabetes: takes medications as prescribed; takes an aspirin a day; Type of Diabetes: Type 2; Sees Diabetes provider 2 times per year; MD managing Diabetes Balan; checks blood glucose once daily; checks feet daily; uses glucometer; hypoglycemia frequency rarely;  7 day CBG average 124; 30 day CBG average 119; 14 day CBG average 130;  Highest CBG 168; Lowest CBG 96; Other Diabetes History:  She is checking once daily in the morning. Compared to last visit averages are about 8-10 points higher. Most readings are above 100, averaging in the 120s. She has more excursions into 140-150s than she did at her last visit. She states that sometimes she feels shaky in the morning when she is at work. She doesn't check her blood sugar at that point but she eats a breakfast bar and then feels better.     Social History:  Caffeine use: 1 servings per day; Denies alcohol use; Exercise adherence 5 days or more days a week; Medication adherence adherent; Patient can afford medications; Patient knows the purpose/use of medications; 150 minutes of exercise per week; Diet adherence Greater than 75% of the  time.  Occupation: EKG Tech  Physical Activity- Still bowling. She hasn't worn the fitbit much because the band broke. She is walking a three days a week in addition to walking at work. She hasn't been feeling good lately and isn't walking as much.  Nutrition-  She continues to have gastritis. She knows that certain foods trigger cramping for her. Fried foods or greasy foods make her cramp.  She is eating salads for lunches. She is eating mixed lettuce, brocolli, raw spinach, boiled egg, beets, red beans, sunflower seeds, celery, sometimes bacon bits, sometimes chicken or tuna. She has been putting kidney beans on the salad and she states that has helped her to feel full.  She has been limiting rice and pasta. She reports no other changes with eating.  Breakfast- oatmeal or breakfast bars. Glucerna shakes in the morning. Breakfast bar at 9 AM.  Lunch- salad bar. sometimes a chicken salad sandwich. Sometimes just vegetables.  usually doesn't snack between lunch and dinner. If she does she will eat a fruit cup.  Dinner- baked chicken or pork chop, vegetables, sometimes rice; butter beans or corn. Sometimes sweet potatoes.  Patient states she doesn't feel hungry. Based on 24 hour recall she is eating less than 3-4 servings of carbohydrates with each meal.     Vital Signs:  09/04/2013 4:03 PM (EST)Blood Pressure 118 / 76 mm/HgBMI 34.4; Height 5 ft 5 in; Weight 206.6 lbs   Testing:  Blood Sugar Tests: Hemoglobin A1c: 7.3 via POCT resulted on 09/04/2013  Assessment/Plan: Patient is a 58 year old female with DM2. A1C today is 7.3% and is increased since her last visit in March of 6.8%. Today's result is above goal of less than 7%. She has been having issues with diarrhea and stomach cramping. She reports that she is having 4-5 loose stools daily and overall states that she just doesn't feel good. She has been taking metformin for several months now. She usually is only taking 2 tablets in the  morning but still has loose stools. In light of medication intolerance to metformin and an increase in A1C I called Dr. Almetta Lovely office to see if we could get the metformin switched to another agent like Januvia. Patient is unwilling at this time to use an injectable medication. I had to leave a message for the nurse and am waiting to hear back from their office. 24 hour recall shows that patient is following a low carbohydrate diet. Most meals she is eating 30 grams of carbohydrates or less. At lunch she is likely getting 15 grams of carbohydrates or less. I cautioned patient against cutting back any further on carbohydrate intake. She has not lost weight since her last visit with me. She feels like she is getting enough to eat. I wonder if some of the time that she is feeling very tired and overall not well are due to a low carbohydrate intake. I will follow up with patient once I hear back from Dr. Almetta Lovely office. Follow up with patient in 3 months.      Goals for Next Visit- 1. You don't need to further limit carbohydrate intake. Try to get some carbohydrates with lunch either through kidney beans on the salad bar or some crackers with your salad. 2. Get a band for your fitbit so you can track your steps through the day. Next visit to see me if Monday December 7th at 3:30 PM

## 2013-11-10 ENCOUNTER — Encounter: Payer: Self-pay | Admitting: Family Medicine

## 2013-11-10 NOTE — Progress Notes (Signed)
Patient ID: Shelley [name], female   DOB: 11/06/1955, 58 y.o.   MRN: [id number] Reviewed: Agree with the documentation and management of our [place] Pharmacologist. 

## 2013-11-29 ENCOUNTER — Other Ambulatory Visit: Payer: Self-pay | Admitting: Cardiovascular Disease

## 2013-11-29 MED ORDER — DILTIAZEM HCL ER COATED BEADS 360 MG PO CP24: 360.0000 mg | ORAL_CAPSULE | Freq: Every day | ORAL | Status: DC

## 2013-11-29 NOTE — Telephone Encounter (Signed)
Pt need a new prescription for Tiazac. Please call it to [phone]

## 2013-11-29 NOTE — Telephone Encounter (Signed)
Rx refill sent to patient pharmacy   

## 2013-12-15 ENCOUNTER — Ambulatory Visit (INDEPENDENT_AMBULATORY_CARE_PROVIDER_SITE_OTHER): Payer: Self-pay | Admitting: Family Medicine

## 2013-12-15 VITALS — BP 116/72 | Wt 205.2 lb

## 2013-12-15 DIAGNOSIS — E119 Type 2 diabetes mellitus without complications: Secondary | ICD-10-CM

## 2013-12-15 NOTE — Assessment & Plan Note (Signed)
Subjective:  Patient presents today for 3 month diabetes follow-up as part of the employer-sponsored Link to Wellness program.  Current diabetes regimen includes Januvia 100 mg daily. Patient also continues on daily  ARB, and statin.  Patient reports that she has been doing pretty good since she saw me last. She has a cold right now. The last she saw Dr. Chalmers Cater in October her medication was changed. She has stopped taking metformin due to diarrhea and she is now taking Januvia 100 mg once daily. She states that she no longer has diarrhea and that she is tolerating Januvia well. Her last A1C with Dr. Chalmers Cater was 7.2%. This was increased because she had not taken metformin for 6 weeks.  She is in the [employer] Diabetes trial and is using the app for the learning modules.    Disease Assessments:    Diabetes:  takes medications as prescribed; takes an aspirin a day; Type of Diabetes: Type 2; Sees Diabetes provider 2 times per year; MD managing Diabetes Balan; checks blood glucose once daily; checks feet daily; uses glucometer; hypoglycemia frequency rarely;   7 day CBG average 108; 14 day CBG average 110; Highest CBG 163; Other Diabetes History:  At last visit she was having problems feeling shaky at work. This has improved since eating Belvita breakfast biscuits in the morning.   7 and 14 day averages are improved about 15-20 points since her last visit with me. She started Januvia in October and she thinks that it has really helped to lower blood sugar. ; Lowest CBG 94      Tobacco Assessment:  Smoking Status: Former smoker; Last Reviewed: 12/11/2013    Pack-years: 30; smoked for 30; Date quit smoking: 2008    Social History:   Caffeine use: 1 servings per day; Denies alcohol use; Exercise adherence 5 days or more days a week; Medication adherence adherent; Patient can afford medications; Patient knows the purpose/use of medications; 150 minutes of exercise per week; Diet adherence Greater  than 75% of the time.    Occupation: EKG Tech     Physical Activity-  She states that she is still bowling and has tried to walk more. She is bowling twice a week and also is walking three days a week. She usually walks at work for 15 minutes to lap around the [employer]	[place]. At home she is using her exercise bike also.   Nutrition-   She reports that the gastritis has improved since stopping metformin. She also states that she has been eating differently and that has helped. She is limiting fried foods. She states that she is trying to cut back on portions. Weight is down 1 pound since her last appointment. She is also eating more fruits and vegetables.      Care Planning:   Learning Preference Assessment:   Learner: Patient  Readiness to Learn Barriers: Fatigue  Teaching Method: Explanation  Evaluation of Learning: Can function independently and verbalize knowledge    Readiness to Change:   How important is your health to you? 10  How confident are you in working to improve your health? 10  How ready are you to change to improve your health? 10  Total Score: 10      Teaching Goals:    Assessment/Plan:   Patient is a 58 year old female with DM2. Her last A1C in October was 7.2% which is slightly above goal of less than 7%. Patient was started on Januvia at that time  and since then blood sugars have decreased. When she returns in January to Dr. Chalmers Cater for a recheck I expect that A1C will be at goal of less than 7%.     Reviewed with patient how to claim badges for the wellness incentive program.     Follow up with patient in 3 months.  Goals for Next Visit-     1. [facility] to make an appointment to see a [phone].  2. Try steel cut oats.  3. Increase walking frequency during the week to 5 times a week.  4. Weight loss- continue monitoring portion sizes. Increase vegetable intake.     Next  appointment to see me is Monday March 14th at 3:30 PM.                             Intake Subjective   Hx   FAF   Geriatric Assess   Disease Assess   Tests Procedure   Care Plan   Plan/Goals   Cost Savings   Letterhead                     Template Problem List Visit Page                                                                              select date                                                               select date                       select date                select date                      select date         select date                                                                     select date         select date          select date         select date                           select date  select date                                                                                                                                                                                                                                                                                                                Care Plan 21NewDel Sequence      Problem      [select]                                                      Role                    Goal       [select]                                             Emmi                  Goal Met         Date Met  select date        Additional Comments                                                                     Teaching Goals  Click to Add...     Click to add Rx      Click to add Orders      Click to add Order Set      Click to add Immunization      Click to add Patient Care Plan/Recall      Click to add Clinical Letter      Click to add Patient Education      Click to add Referral      Click to launch E&M Evaluator      Rx       Orders       OrderSets       Immunize       Recall       Letter       Education       Referral       E&M                                                      Close     PreviousNext                                         select date         select date               CancelCancelDelete        Alert     Cancel       Close                 [place]                                                                                               select date                       select date  Subjective:    Patient presents today for 3 month diabetes follow-up as part of the employer-sponsored Link to Wellness program.  Current diabetes regimen includes Januvia 100 mg daily. Patient also continues on daily  ARB, and statin.     Patient reports that she has been doing pretty good since she saw me last. She has a cold right now. The last she saw Dr. Chalmers Cater in October her medication was changed. She has stopped taking metformin due to diarrhea and she is now taking Januvia 100 mg once daily. She states that she no longer has diarrhea and that she is tolerating Januvia well. Her last A1C with Dr. Chalmers Cater was 7.2%. This was increased because she had not taken metformin for 6 weeks.     She is in the [employer] Diabetes trial and is using the app for the learning modules.      Disease Assessments:    Diabetes:  takes medications as prescribed; takes an aspirin a day; Type of Diabetes: Type 2; Sees Diabetes provider 2 times per year; MD managing Diabetes Balan; checks blood glucose once daily; checks feet daily; uses glucometer; hypoglycemia frequency rarely; 7 day CBG average 108; 14 day CBG average 110; Highest CBG 163; Other Diabetes History:  At last visit she was having problems feeling shaky at work. This has improved since eating Belvita breakfast biscuits in the morning.     7 and 14 day averages are improved about 15-20 points since her last visit with me. She started Januvia in October and she thinks that it has really helped to lower blood sugar. ; Lowest CBG 94     Past Medical/Surgical History:   The patient has a past medical history of Diabetes Mellitus type 2, Hypertension, Cardiovascular Disease.   There is no past medical history of Pulmonary Disease, Cancer, Gastrointestinal Disease, Asthma, Osteoporosis, Human Immunodeficiency Virus (HIV), Migraines, Anemia, Gastroesophageal Reflux, Hepatitis, Coronary Artery Disease (CAD), Sexually  Transmitted Disease, Congestive Heart Failure, Gestational Diabetes, Obesity.        Other Medical History:   Thyroid Removed in May 2012; Had a heart attack 5 years ago-2008    Cardiologist: Little; Last Cardiology Visit: 02/11/2011        Tobacco Assessment:  Smoking Status: Former smoker; Last Reviewed: 12/11/2013    Pack-years: 30; smoked for 30; Date quit smoking: 2008      Social History:   Caffeine use: 1 servings per day; Denies alcohol use; Exercise adherence 5 days or more days a week; Medication adherence adherent; Patient can afford medications; Patient knows the purpose/use of medications; 150 minutes of exercise per week; Diet adherence Greater than 75% of the time.    Occupation: EKG Tech     Physical Activity-  She states that she is still bowling and has tried to walk more. She is bowling twice a week and also is walking three days a week. She usually walks at work for 15 minutes to lap around the [employer]	[place]. At home she is using her exercise bike also.   Nutrition-   She reports that the gastritis has improved since stopping metformin. She also states that she has been eating differently and that has helped. She is limiting fried foods. She states that she is trying to cut back on portions. Weight is down 1 pound since her last appointment. She is also eating more fruits and vegetables.        Preventive Care:     Hemoglobin A1c: 10/26/2013 via  POCT 7.2       Dilated Eye Exam: 10/28/2012     Flu vaccine: 10/05/2013    Other Preventive Care Notes:   Patient has dentures.           Vital Signs:    12/11/2013 3:31 PM (EST) Height 5 ft 5 in     Care Planning:   Learning Preference Assessment:   Learner: Patient  Readiness to Learn Barriers: Fatigue  Teaching Method: Explanation  Evaluation of Learning: Can function independently and verbalize knowledge    Readiness to Change:   How important is your health to  you? 10  How confident are you in working to improve your health? 10  How ready are you to change to improve your health? 10  Total Score: 10      Teaching Goals:    Diabetes Mellitus:  Other  Patient is a 58 year old female with DM2. Her last A1C in October was 7.2% which is slightly above goal of less than 7%. Patient was started on Januvia at that time and since then blood sugars have decreased. When she returns in January to Dr. Chalmers Cater for a recheck I expect that A1C will be at goal of less than 7%.     Reviewed with patient how to claim badges for the wellness incentive program.     Follow up with patient in 3 months.  Goals for Next Visit-     1. [facility] to make an appointment to see a [phone].  2. Try steel cut oats.  3. Increase walking frequency during the week to 5 times a week.  4. Weight loss- continue monitoring portion sizes. Increase vegetable intake.     Next appointment to see me is Monday March 14th at 3:30 PM.                             Intake Subjective   Hx   FAF   Geriatric Assess   Disease Assess   Tests Procedure   Care Plan   Plan/Goals   Cost Savings   Letterhead                     Template Problem List Visit Page                                                                              select date                                                               select date                       select date                select date  select date         select date                                                                     select date         select date          select date         select date                            select date                                                                                               select date                                                                                                                                                                                                                                                                                                                Care Plan 21NewDel Sequence      Problem      [select]  Role                    Goal      [select]                                             Emmi                  Goal Met         Date Met  select date        Additional Comments                                                                     Teaching Goals                                                 Click to Add...     Click to add Rx      Click to add Orders      Click to add Order Set      Click to add Immunization      Click to add Patient Care Plan/Recall      Click to add Clinical Letter      Click to add Patient Education      Click to add Referral      Click to launch E&M Evaluator      Rx       Orders       OrderSets       Immunize       Recall       Letter       Education       Referral       E&M                                                       Close     PreviousNext                                         select date         select date               CancelCancelDelete        Alert     Cancel       Close                 [place]  select date                       select date

## 2014-01-18 NOTE — Progress Notes (Signed)
Patient ID: Shelley [name], female   DOB: [birth year], 59 y.o.   MRN: [id number] Reviewed: Agree with the documentation and management of our [place].

## 2014-03-19 ENCOUNTER — Ambulatory Visit (INDEPENDENT_AMBULATORY_CARE_PROVIDER_SITE_OTHER): Payer: Self-pay | Admitting: Family Medicine

## 2014-03-19 ENCOUNTER — Other Ambulatory Visit: Payer: Self-pay | Admitting: Cardiovascular Disease

## 2014-03-19 VITALS — BP 152/94 | Wt 202.8 lb

## 2014-03-19 DIAGNOSIS — E119 Type 2 diabetes mellitus without complications: Secondary | ICD-10-CM

## 2014-03-19 NOTE — Progress Notes (Signed)
Subjective:  Patient presents today for 3 month diabetes follow-up as part of the employer-sponsored Link to Wellness program. Current diabetes regimen includes Januvia 184m daily. Patient also continues on daily ASA, ARB, and statin. Most recent MD follow-up was Jan, 2016 with Dr. BChalmers Cater Patient has a pending appt for summer 2016. No med changes or major health changes at this time.    Disease Assessments:  Diabetes: takes medications as prescribed; takes an aspirin a day; Type of Diabetes: Type 2; Sees Diabetes provider 2 times per year; MD managing Diabetes Balan; checks blood glucose once daily; checks feet daily; uses glucometer; Highest CBG 131; Lowest CBG 81; hypoglycemia frequency rarely; What is target A1c? 7 %; Other Diabetes History:   Her A1c today is at goal, 6.8% via POCT. Review of her home glucometer shows her CBGs are all at goal over the past several weeks. Mostly running 90s-110s. She denies any episodes of hypoglycemia, recognizes lows when she gets shaky. She explains appropriate knowledge of how to correct hypoglycemia but states she has not needed to do so in a long time. She reports checking her feet daily, no sores or open wounds currently.       Other Medical History:  Thyroid Removed in May 2012; Had a heart attack 5 years ago-2008  Cardiologist: Little; Last Cardiology Visit: 02/11/2011   Tobacco Assessment: Smoking Status: Former smoker; Last Reviewed: 03/19/2014  Pack-years: 30; smoked for 30; Date quit smoking: 2008   Social History:  Caffeine use: 1 servings per day; Denies alcohol use; Exercise adherence 5 days or more days a week; Medication adherence adherent; Patient can afford medications; Patient knows the purpose/use of medications; 150 minutes of exercise per week; Diet adherence Greater than 75% of the time.  Occupation: EKG Tech  Physical Activity- She states that she is still bowling twice weekly and has tried to walk more. She plans to start walking  around a path near her house as the weather starts getting warmer. She got a new FitBit for Christmas and states she usually gets 8000-9000 steps each day. At home she is using her exercise bike x20 minutes about 3 times each week. Nutrition-  She tried using steel cut oats for a period of time but didn't like them. She is now back to using instant oatmeal with butter and some sugar (reported as small amount of sugar). She has increased veggies to getting at least one serving daily. She often eats at the salad bar at work, also eats some baked chicken and fish.   Preventive Care:    Hemoglobin A1c: 03/19/2014 via POCT 6.8    Dilated Eye Exam: 10/04/2013  Flu vaccine: 10/05/2013  Foot Exam: 03/19/2014  Other Preventive Care Notes:  Patient has dentures.      Overall Health Assessments:  Vision:  Dilated Eye Exam: 10/04/2013   Vital Signs:  03/19/2014 5:42 PM (EST)Blood Pressure 152 / 94 mm/HgBMI 33.7; Height 5 ft 5 in; Weight 202.8 lbs   Testing:  Blood Sugar Tests: Hemoglobin A1c: 6.8 Via POCT resulted on 03/19/2014   Care Planning:  Learning Preference Assessment:  Learner: Patient  Readiness to Learn Barriers: Fatigue  Teaching Method: Explanation  Evaluation of Learning: Can function independently and verbalize knowledge  Readiness to Change:  How important is your health to you? 10  How confident are you in working to improve your health? 10  How ready are you to change to improve your health? 10  Total Score: 10  Care Plan:  03/19/2014 5:42 PM (EST) (1)  Problem: BMI>30  Long Term Goal [facility] to make an appointment to see a [phone].  No Goal Met  Short Term Goal #1: Start walking on the walking path near your home - aim for 30 minutes 3 times each week  No Goal Met   Teaching Goals:  Assessment/Plan: Patient is a 59 year old female with DM2. Her A1C today is at goal of <7% - 6.8% today via POCT, which is  improved since her last check in October 2015 of 7.3%. Provided pt with a new glucometer today and request sent for prescription for test strips and lancets. Med list was reviewed with patient, med list updated, meds refilled, and refill requests sent to provider. Of note, pt has elevated BP today (x2) but has not filled her valsartan/HCTZ in several months. Pt states she has been taking this - unclear if perhaps she has been using samples?  She would like to see a dietitian in order to claim the healthy weight badge through the employee wellness program.  Follow up with patient in 3 months. Will fax A1c results to Dr. Chalmers Cater.  Patient seen by Soyla Dryer, PharmD, PGY1 Ambulatory Care Pharmacy Resident  Goals for next visit: 1. Call [facility] to make an appointment to see a [phone]. 2. Start walking on the walking path near your home - aim for 30 minutes 3 times each week 3. Continue to work on weight loss and reducing portion sizes 4. Next appointment to see me is Monday, June 20th at 3:30pm

## 2014-04-09 NOTE — Progress Notes (Signed)
Patient ID: Shelley [name], female   DOB: 1955/10/08, 59 y.o.   MRN: [id number] Reviewed: Agree with the documentation and management of our [place].

## 2014-05-15 ENCOUNTER — Encounter (HOSPITAL_COMMUNITY): Payer: Self-pay | Admitting: Emergency Medicine

## 2014-05-15 ENCOUNTER — Emergency Department (INDEPENDENT_AMBULATORY_CARE_PROVIDER_SITE_OTHER)
Admission: EM | Admit: 2014-05-15 | Discharge: 2014-05-15 | Disposition: A | Discharge status: Home / Self Care | Payer: 59 | Source: Home / Self Care | Attending: Family Medicine | Admitting: Family Medicine

## 2014-05-15 DIAGNOSIS — M6588 Other synovitis and tenosynovitis, other site: Secondary | ICD-10-CM

## 2014-05-15 DIAGNOSIS — M7750 Other enthesopathy of unspecified foot: Secondary | ICD-10-CM

## 2014-05-15 DIAGNOSIS — S93402A Sprain of unspecified ligament of left ankle, initial encounter: Secondary | ICD-10-CM

## 2014-05-15 NOTE — ED Provider Notes (Signed)
CSN: [id number]     Arrival date & time 05/15/14  1544 History   First MD Initiated Contact with Patient 05/15/14 1654     Chief Complaint  Patient presents with  . Foot Pain   (Consider location/radiation/quality/duration/timing/severity/associated sxs/prior Treatment) HPI Comments: 59 year old female complaining of left foot pain for one day. She states the pain is located to the dorsum of the foot and ankle and is much worse with weightbearing. It is better with off weight. She states she did not injure her foot to the best of her knowledge. Was no twisting of the ankle or other movements that she would recognize as injurious. No history of similar pain. 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 She is obese.   Past Medical History  Diagnosis Date  . Hypertension   . Diabetes mellitus   . MI (myocardial infarction)   . Thyroid disease   . CAD (coronary artery disease)   . Dyslipidemia   . Cardiac arrhythmia    Past Surgical History  Procedure Laterality Date  . Back surgery  5/99 & 6/99  . Coronary angioplasty with stent placement  07/17/1998    stent LAD  . Cardiac catheterization  01/12/2002    stent patent  . Coronary angioplasty with stent placement  01/07/2006    stent RCA,LAD stent patent  . Coronary angioplasty with stent placement  03/18/2006    stent to mid RCA,prox.RCA and LAD stent patent  . Cardiac catheterization  09/03/2006    patent stents  . Thyroid surgery      PT. DENIES/RADIATION   Family History  Problem Relation Age of Onset  . Hypertension Mother   . Thyroid disease Mother   . Esophageal cancer Maternal Aunt   . Prostate cancer Maternal Uncle    History  Substance Use Topics  . Smoking status: Former Research scientist (life sciences)  . Smokeless tobacco: Former Systems developer    Quit date: 01/05/2005  . Alcohol Use: No   OB History    No data available     Review of Systems  Constitutional: Negative.   Respiratory: Negative.    Musculoskeletal:       As per history of present illness  Skin: Negative for color change, pallor, rash and wound.  Neurological: Negative.   All other systems reviewed and are negative.   Allergies  Levothyroxine; Lopressor; and Norvasc  Home Medications   Prior to Admission medications   Medication Sig Start Date End Date Taking? Authorizing Provider  albuterol (PROAIR HFA) 108 (90 BASE) MCG/ACT inhaler Inhale 2 puffs into the lungs every 6 (six) hours as needed. For coughing and/or wheezing    Historical Provider, MD  diltiazem (CARDIZEM CD) 360 MG 24 hr capsule Take 1 capsule (360 mg total) by mouth daily. 11/29/13   Mihai Croitoru, MD  doxepin (SINEQUAN) 10 MG capsule Take 10 mg by mouth at bedtime.    Historical Provider, MD  ezetimibe (ZETIA) 10 MG tablet Take 1 tablet (10 mg total) by mouth daily. Patient not taking: Reported on 03/19/2014 08/25/13   Dani Gobble Croitoru, MD  fexofenadine (ALLEGRA) 180 MG tablet Take 180 mg by mouth daily.     Historical Provider, MD  ketoconazole (NIZORAL) 2 % cream Apply 1 application topically as needed. For skin irritation    Historical Provider, MD  rosuvastatin (CRESTOR) 20 MG tablet Take 1 tablet (20 mg total) by mouth daily. 04/26/13   Isaiah Serge, NP  sitaGLIPtin Celesta Gentile)  100 MG tablet Take 100 mg by mouth daily.    Historical Provider, MD  thyroid (ARMOUR) 15 MG tablet Take 15 mg by mouth every morning. Take one tablet with one 60mg  tablet for every morning before breakfast for a total 75mg  days Monday-Thursday    Historical Provider, MD  thyroid (ARMOUR) 60 MG tablet Take 60 mg by mouth every morning. Take one 60mg  tablet with a 15mg  tablet for a total 75mg  every morning before breakfast    Historical Provider, MD  valsartan-hydrochlorothiazide (DIOVAN-HCT) 160-12.5 MG per tablet TAKE 1 TABLET BY MOUTH DAILY 03/19/14   Mihai Croitoru, MD   BP 138/85 mmHg  Pulse 88  Temp(Src) 98.5 F (36.9 C) (Oral)  Resp 16  SpO2 97% Physical Exam   Constitutional: She is oriented to person, place, and time. She appears well-developed. No distress.  Neck: Normal range of motion. Neck supple.  Pulmonary/Chest: Effort normal. No respiratory distress.  Musculoskeletal:  Left foot and ankle exam reveals tenderness over the deltoid ligament. There is minor tenderness to the dorsal tarsals. No tenderness to the forefoot or metatarsals. Dorsiflexion produces pain in the above areas. Plantar flexion is normal and without pain. Distal neurovascular motor sensory is intact.  Neurological: She is alert and oriented to person, place, and time. She exhibits normal muscle tone.  Skin: Skin is warm.  Psychiatric: She has a normal mood and affect.  Nursing note and vitals reviewed.   ED Course  Procedures (including critical care time) Labs Review Labs Reviewed - No data to display  Imaging Review No results found.   MDM   1. Ankle sprain, left, initial encounter   2. Tendinitis of ankle    ASO splint Ice elevation No work for 2 d. F/U wit your PCP as needed   Janne Napoleon, NP 05/15/14 1735

## 2014-05-15 NOTE — Discharge Instructions (Signed)
Ankle Sprain °An ankle sprain is an injury to the strong, fibrous tissues (ligaments) that hold the bones of your ankle joint together.  °CAUSES °An ankle sprain is usually caused by a fall or by twisting your ankle. Ankle sprains most commonly occur when you step on the outer edge of your foot, and your ankle turns inward. People who participate in sports are more prone to these types of injuries.  °SYMPTOMS  °· Pain in your ankle. The pain may be present at rest or only when you are trying to stand or walk. °· Swelling. °· Bruising. Bruising may develop immediately or within 1 to 2 days after your injury. °· Difficulty standing or walking, particularly when turning corners or changing directions. °DIAGNOSIS  °Your caregiver will ask you details about your injury and perform a physical exam of your ankle to determine if you have an ankle sprain. During the physical exam, your caregiver will press on and apply pressure to specific areas of your foot and ankle. Your caregiver will try to move your ankle in certain ways. An X-ray exam may be done to be sure a bone was not broken or a ligament did not separate from one of the bones in your ankle (avulsion fracture).  °TREATMENT  °Certain types of braces can help stabilize your ankle. Your caregiver can make a recommendation for this. Your caregiver may recommend the use of medicine for pain. If your sprain is severe, your caregiver may refer you to a surgeon who helps to restore function to parts of your skeletal system (orthopedist) or a physical therapist. °HOME CARE INSTRUCTIONS  °· Apply ice to your injury for 1-2 days or as directed by your caregiver. Applying ice helps to reduce inflammation and pain. °· Put ice in a plastic bag. °· Place a towel between your skin and the bag. °· Leave the ice on for 15-20 minutes at a time, every 2 hours while you are awake. °· Only take over-the-counter or prescription medicines for pain, discomfort, or fever as directed by  your caregiver. °· Elevate your injured ankle above the level of your heart as much as possible for 2-3 days. °· If your caregiver recommends crutches, use them as instructed. Gradually put weight on the affected ankle. Continue to use crutches or a cane until you can walk without feeling pain in your ankle. °· If you have a plaster splint, wear the splint as directed by your caregiver. Do not rest it on anything harder than a pillow for the first 24 hours. Do not put weight on it. Do not get it wet. You may take it off to take a shower or bath. °· You may have been given an elastic bandage to wear around your ankle to provide support. If the elastic bandage is too tight (you have numbness or tingling in your foot or your foot becomes cold and blue), adjust the bandage to make it comfortable. °· If you have an air splint, you may blow more air into it or let air out to make it more comfortable. You may take your splint off at night and before taking a shower or bath. Wiggle your toes in the splint several times per day to decrease swelling. °SEEK MEDICAL CARE IF:  °· You have rapidly increasing bruising or swelling. °· Your toes feel extremely cold or you lose feeling in your foot. °· Your pain is not relieved with medicine. °SEEK IMMEDIATE MEDICAL CARE IF: °· Your toes are numb or blue. °·   You have severe pain that is increasing. MAKE SURE YOU:   Understand these instructions.  Will watch your condition.  Will get help right away if you are not doing well or get worse. Document Released: 12/22/2004 Document Revised: 09/16/2011 Document Reviewed: 01/03/2011 [facility] Patient Information 2015 [place], [place]. This information is not intended to replace advice given to you by your health care provider. Make sure you discuss any questions you have with your health care provider.  Foot Sprain The muscles and cord like structures which attach muscle to bone (tendons) that surround the feet are made up of  units. A foot sprain can occur at the weakest spot in any of these units. This condition is most often caused by injury to or overuse of the foot, as from playing contact sports, or aggravating a previous injury, or from poor conditioning, or obesity. SYMPTOMS  Pain with movement of the foot.  Tenderness and swelling at the injury site.  Loss of strength is present in moderate or severe sprains. THE THREE GRADES OR SEVERITY OF FOOT SPRAIN ARE:  Mild (Grade I): Slightly pulled muscle without tearing of muscle or tendon fibers or loss of strength.  Moderate (Grade II): Tearing of fibers in a muscle, tendon, or at the attachment to bone, with small decrease in strength.  Severe (Grade III): Rupture of the muscle-tendon-bone attachment, with separation of fibers. Severe sprain requires surgical repair. Often repeating (chronic) sprains are caused by overuse. Sudden (acute) sprains are caused by direct injury or over-use. DIAGNOSIS  Diagnosis of this condition is usually by your own observation. If problems continue, a caregiver may be required for further evaluation and treatment. X-rays may be required to make sure there are not breaks in the bones (fractures) present. Continued problems may require physical therapy for treatment. PREVENTION  Use strength and conditioning exercises appropriate for your sport.  Warm up properly prior to working out.  Use athletic shoes that are made for the sport you are participating in.  Allow adequate time for healing. Early return to activities makes repeat injury more likely, and can lead to an unstable arthritic foot that can result in prolonged disability. Mild sprains generally heal in 3 to 10 days, with moderate and severe sprains taking 2 to 10 weeks. Your caregiver can help you determine the proper time required for healing. HOME CARE INSTRUCTIONS   Apply ice to the injury for 15-20 minutes, 03-04 times per day. Put the ice in a plastic bag and  place a towel between the bag of ice and your skin.  An elastic wrap (like an Ace bandage) may be used to keep swelling down.  Keep foot above the level of the heart, or at least raised on a footstool, when swelling and pain are present.  Try to avoid use other than gentle range of motion while the foot is painful. Do not resume use until instructed by your caregiver. Then begin use gradually, not increasing use to the point of pain. If pain does develop, decrease use and continue the above measures, gradually increasing activities that do not cause discomfort, until you gradually achieve normal use.  Use crutches if and as instructed, and for the length of time instructed.  Keep injured foot and ankle wrapped between treatments.  Massage foot and ankle for comfort and to keep swelling down. Massage from the toes up towards the knee.  Only take over-the-counter or prescription medicines for pain, discomfort, or fever as directed by your caregiver. SEEK IMMEDIATE  MEDICAL CARE IF:   Your pain and swelling increase, or pain is not controlled with medications.  You have loss of feeling in your foot or your foot turns cold or blue.  You develop new, unexplained symptoms, or an increase of the symptoms that brought you to your caregiver. MAKE SURE YOU:   Understand these instructions.  Will watch your condition.  Will get help right away if you are not doing well or get worse. Document Released: 06/13/2001 Document Revised: 03/16/2011 Document Reviewed: 08/11/2007 [facility] Patient Information 2015 [place], [place]. This information is not intended to replace advice given to you by your health care provider. Make sure you discuss any questions you have with your health care provider.  Ligament Sprain Ligaments are tough, fibrous tissues that hold bones together at the joints. A sprain can occur when a ligament is stretched. This injury may take several weeks to heal. [place] the injured area for as long as directed by your caregiver. Then slowly start using the joint as directed by your caregiver and as the pain allows.  Keep the affected joint raised if possible to lessen swelling.  Apply ice for 15-20 minutes to the injured area every couple hours for the first half day, then 03-04 times per day for the first 48 hours. Put the ice in a plastic bag and place a towel between the bag of ice and your skin.  Wear any splinting, casting, or elastic bandage applications as instructed.  Only take over-the-counter or prescription medicines for pain, discomfort, or fever as directed by your caregiver. Do not use aspirin immediately after the injury unless instructed by your caregiver. Aspirin can cause increased bleeding and bruising of the tissues.  If you were given crutches, continue to use them as instructed and do not resume weight bearing on the affected extremity until instructed. SEEK MEDICAL CARE IF:   Your bruising, swelling, or pain increases.  You have cold and numb fingers or toes if your arm or leg was injured. SEEK IMMEDIATE MEDICAL CARE IF:   Your toes are numb or blue if your leg was injured.  Your fingers are numb or blue if your arm was injured.  Your pain is not responding to medicines and continues to stay the same or gets worse. MAKE SURE YOU:   Understand these instructions.  Will watch your condition.  Will get help right away if you are not doing well or get worse. Document Released: 12/20/1999 Document Revised: 03/16/2011 Document Reviewed: 10/18/2007 [facility] Patient Information 2015 [place], [place]. This information is not intended to replace advice given to you by your health care provider. Make sure you discuss any questions you have with your health care provider.

## 2014-05-15 NOTE — ED Notes (Signed)
Patient c/o left foot pain onset yesterday. Patient reports pain is worse when she stands up. Patient reports she has taken 800 mg ibuprofen and aleeve today with no relief. Patient is in NAD.

## 2014-06-01 ENCOUNTER — Encounter: Payer: Self-pay | Admitting: Pharmacist

## 2014-06-25 ENCOUNTER — Ambulatory Visit (INDEPENDENT_AMBULATORY_CARE_PROVIDER_SITE_OTHER): Payer: Self-pay | Admitting: Family Medicine

## 2014-06-25 ENCOUNTER — Other Ambulatory Visit: Payer: Self-pay | Admitting: Cardiology

## 2014-06-25 ENCOUNTER — Other Ambulatory Visit: Payer: Self-pay | Admitting: Cardiovascular Disease

## 2014-06-25 ENCOUNTER — Ambulatory Visit: Payer: 59 | Admitting: Pharmacist

## 2014-06-25 VITALS — BP 108/60 | Ht 65.0 in | Wt 208.0 lb

## 2014-06-25 DIAGNOSIS — E119 Type 2 diabetes mellitus without complications: Secondary | ICD-10-CM

## 2014-06-25 NOTE — Progress Notes (Signed)
Subjective:  Patient presents today for 3 month diabetes follow-up as part of the employer-sponsored Link to Wellness program.  Current diabetes regimen includes Januvia 100 mg daily.  Patient also continues on daily ASA, ARB and statin.  Most recent MD follow-up was in January with Dr. Chalmers Cater. Patient has a pending appt for next month with Dr. Chalmers Cater. No med changes or major health changes at this time.   Refilled patient's medications today. She claims that she takes medications daily and does not miss doses yet she is late on almost all of her refills.   Her most recent A1C was at the completion of the [employer] diabetes study, A1C was 6.5%.    Assessment/Plan:  Patient is a 59 y.o. female with DM 2. Most recent A1C was   6.5% which is at goal of less than 7%. Weight is increased from last visit with me.   CBG Review: Patient is checking once daily, fasting. The majority of readings are between 100-130. She denies hypoglycemia.   Lifestyle improvements:  Physical Activity-  Patient sprained her ankle in April and has not been able to exercise since then. She is no longer wearing the brace but states she hasn't been bowling. She is walking some after work.    Nutrition-  Patient has started eating Belvita bars as a snack in the morning. Weight is up 6 pounds since her last visit with me which may be from the addition of the Belvita bars, but more likely due to no exercise following an ankle injury.   Patient still would like to see a dietitian so she can claim the weight badge through [employer].   Follow up with me in 3 months.    Goals for Next Visit:  1. Finish the badges for the [employer] program. Keep logging in your workouts- you need 18 more to make it to 50.  2. Make an appointment to see Dr. Everette Rank before September to have a physical exam.  3. [facility] to make an appointment to see a [phone]  [phone].     Next appointment to see me is: Monday September 19th at 3:30 PM.    Jinny Blossom [name], PharmD, BCPS, CDE [facility] [place] Coordinator [phone]

## 2014-07-02 ENCOUNTER — Other Ambulatory Visit: Payer: Self-pay

## 2014-07-10 NOTE — Progress Notes (Signed)
Patient ID: SONALI WIVELL, female   DOB: 1955/12/23, 59 y.o.   MRN: [id number] ATTENDING PHYSICIAN NOTE: I have reviewed the chart and agree with the plan as detailed above. Dorcas Mcmurray MD Pager [phone]

## 2014-08-11 ENCOUNTER — Encounter: Payer: 59 | Attending: Family Medicine

## 2014-08-11 VITALS — Ht 65.0 in | Wt 206.2 lb

## 2014-08-11 DIAGNOSIS — Z713 Dietary counseling and surveillance: Secondary | ICD-10-CM | POA: Insufficient documentation

## 2014-08-11 DIAGNOSIS — E119 Type 2 diabetes mellitus without complications: Secondary | ICD-10-CM | POA: Diagnosis not present

## 2014-08-14 NOTE — Progress Notes (Signed)
Patient was seen on 08/11/14 for the complete diabetes self-management series at the [facility]. This is a part of the Link to [employer].  Handouts given during class include:  Living Well with Diabetes book  Carb Counting and Meal Planning book  Meal Plan Card  Carbohydrate guide  Meal planning worksheet  Low Sodium Flavoring Tips  The diabetes portion plate  Low Carbohydrate Snack Suggestions  A1c to eAG Conversion Chart  Diabetes Medications  Stress Management  Diabetes Recommended Care Schedule  Diabetes Success Plan  Core Class Satisfaction Survey  The following learning objectives were met by the patient during this course:  Describe diabetes  State some common risk factors for diabetes  Defines the role of glucose and insulin  Identifies type of diabetes and pathophysiology  Describe the relationship between diabetes and cardiovascular risk  State the members of the Healthcare Team  States the rationale for glucose monitoring  State when to test glucose  State their individual Target Range  State the importance of logging glucose readings  Describe how to interpret glucose readings  Identifies A1C target  Explain the correlation between A1c and eAG values  State symptoms and treatment of high blood glucose  State symptoms and treatment of low blood glucose  Explain proper technique for glucose testing  Identifies proper sharps disposal  Describe the role of different macronutrients on glucose  Explain how carbohydrates affect blood glucose  State what foods contain the most carbohydrates  Demonstrate carbohydrate counting  Demonstrate how to read Nutrition Facts food label  Describe effects of various fats on heart health  Describe the importance of good nutrition for health and healthy eating strategies  Describe techniques for managing your shopping, cooking and meal planning  List  strategies to follow meal plan when dining out  Describe the effects of alcohol on glucose and how to use it safely . State the amount of activity recommended for healthy living . Describe activities suitable for individual needs . Identify ways to regularly incorporate activity into daily life . Identify barriers to activity and ways to over come these barriers  Identify diabetes medications being personally used and their primary action for lowering glucose and possible side effects . Describe role of stress on blood glucose and develop strategies to address psychosocial issues . Identify diabetes complications and ways to prevent them  Explain how to manage diabetes during illness . Evaluate success in meeting personal goal . Establish 2-3 goals that they will plan to diligently work on until they return for the  19-monthfollow-up visit  Goals:   I will count my carb choices at most meals and snacks  I will be active 30 minutes or more 5 times a week  I will take my diabetes medications as scheduled  I will eat less unhealthy fats  I will test my glucose at least 1 times a day, 7 days a week  I will look at patterns in my record book at least 7 days a month  To help manage stress I will  relax at least 7 times a week  Your patient has identified these potential barriers to change:  No barriers noted  Your patient has identified their diabetes self-care support plan as  [employer] On-line Resources- American Diabetes Association Web site  Plan: [facility] with [facility]

## 2014-09-24 ENCOUNTER — Ambulatory Visit (INDEPENDENT_AMBULATORY_CARE_PROVIDER_SITE_OTHER): Payer: Self-pay | Admitting: Family Medicine

## 2014-09-24 ENCOUNTER — Other Ambulatory Visit: Payer: Self-pay | Admitting: Cardiovascular Disease

## 2014-09-24 ENCOUNTER — Encounter: Payer: Self-pay | Admitting: Pharmacist

## 2014-09-24 ENCOUNTER — Ambulatory Visit: Payer: 59 | Admitting: Pharmacist

## 2014-09-24 VITALS — BP 148/94 | Ht 65.0 in | Wt 204.4 lb

## 2014-09-24 DIAGNOSIS — E119 Type 2 diabetes mellitus without complications: Secondary | ICD-10-CM

## 2014-09-24 NOTE — Progress Notes (Signed)
Subjective:  Patient presents today for 3 month diabetes follow-up as part of the employer-sponsored Link to Wellness program.  Current diabetes regimen includes no medication for diabetes.   Patient also continues on daily ARB  and statin.  No major health changes at this time.   Patient had a visit with Dr. Chalmers Cater over the summer (July or August).  A1C at that visit was 6.5% (per patient portal). Next visit with Dr. Chalmers Cater is in January. LDL cholesterol was also elevated at the visit in July to 163. She returns in October for follow up lab work.   Patient was experiencing chronic upper respiratory infection symptoms (cough, nasal drainage) for several weeks-months. She thought that this may be due to Januvia (can cause nasopharyngitis in up to 6.3% of patients). She brought this up with Dr. Chalmers Cater and she d/c Januvia 2 weeks ago. Patient states that the cough has improved but it is still persistent. Patient states that since Januvia was d/c she hasn't noticed a huge change with her fasting CBGs.   Patient received her Flu shot today.   Assessment:  Diabetes: Most recent A1C was 6.5  % which is at goal of less than 7%. Weight is decreased from last visit with me by four pounds.     CBG Review: Patient is checking once daily. Since stopping Januvia 2 weeks ago CBGs are about 10-15 points higher, usually between 110-130. Prior to stopping Januvia they were low 100s-110s.   High/Low- 148/86  Gave patient a new meter, True Metrix with test strips and lancets.   Lifestyle improvements:  Physical Activity-  Patient has been walking. Her fitbit broke and she is interested in replacing it. Showed patient options to replace the fitbit, including the garmin vivofit.   She has restarted bowling twice a week. She is walking three days a week for a total of 30 minutes (she splits it up into 2-3 increments).    Nutrition-  She recently attended an all day diabetes class at the [facility]. No other changes  to diet.    Follow up with me in 3 months.    Plan/Goals for Next Visit:  1. Improve LDL by your next visit in October for blood work. Ideas to accomplish this- make sure you continue to take your Zetia and Crestor every day; continue physical activity at least 5 days a week for 30 minutes.  2. While you are bowling, try to walk around the alley for 30 minutes in between your frames.     Next appointment to see me is: Monday December 19th at 3:30 PM.    Shelley [name] [name], PharmD, BCPS, CDE [facility] [place] Coordinator [phone]

## 2014-09-24 NOTE — Telephone Encounter (Signed)
Rx request sent to pharmacy.  

## 2014-10-22 NOTE — Progress Notes (Signed)
I reviewed this chart and agree with the pharmacist's recommendation.  

## 2014-11-22 ENCOUNTER — Encounter: Payer: Self-pay | Admitting: Obstetrics and Gynecology

## 2014-11-22 ENCOUNTER — Ambulatory Visit (INDEPENDENT_AMBULATORY_CARE_PROVIDER_SITE_OTHER): Payer: 59 | Admitting: Obstetrics and Gynecology

## 2014-11-22 VITALS — BP 130/78 | HR 88 | Resp 16 | Ht 64.0 in | Wt 203.0 lb

## 2014-11-22 DIAGNOSIS — IMO0002 Reserved for concepts with insufficient information to code with codable children: Secondary | ICD-10-CM

## 2014-11-22 DIAGNOSIS — Z1272 Encounter for screening for malignant neoplasm of vagina: Secondary | ICD-10-CM

## 2014-11-22 DIAGNOSIS — N9489 Other specified conditions associated with female genital organs and menstrual cycle: Secondary | ICD-10-CM | POA: Diagnosis not present

## 2014-11-22 DIAGNOSIS — Z9189 Other specified personal risk factors, not elsewhere classified: Secondary | ICD-10-CM

## 2014-11-22 DIAGNOSIS — Z124 Encounter for screening for malignant neoplasm of cervix: Secondary | ICD-10-CM | POA: Diagnosis not present

## 2014-11-22 DIAGNOSIS — N952 Postmenopausal atrophic vaginitis: Secondary | ICD-10-CM | POA: Diagnosis not present

## 2014-11-22 DIAGNOSIS — N8110 Cystocele, unspecified: Secondary | ICD-10-CM | POA: Diagnosis not present

## 2014-11-22 DIAGNOSIS — N898 Other specified noninflammatory disorders of vagina: Secondary | ICD-10-CM

## 2014-11-22 DIAGNOSIS — Z01419 Encounter for gynecological examination (general) (routine) without abnormal findings: Secondary | ICD-10-CM

## 2014-11-22 NOTE — Patient Instructions (Addendum)

## 2014-11-22 NOTE — Progress Notes (Signed)
Patient ID: Shelley [name], female   DOB: 06-02-1955, 59 y.o.   MRN: [id number] 59 y.o. G1P1001 MarriedAfrican AmericanF here for annual exam.  She is PMP , no vaginal bleeding. She is occasionally sexually active, can be slightly uncomfortable, tolerable.  She c/o a vaginal odor over the last 3-6 months. She used douche, it feels like something is blocking her vagina.  In the past her bladder was dropping. She denies an vaginal pressure. No urinary incontinence. She c/o frequent urination, normal amounts. Only occasional nocturia. No bowel c/o. Diabetes well controlled, last HgbA1C was about 6.5%.     No LMP recorded. Patient is postmenopausal.          Sexually active: Yes.    The current method of family planning is tubal ligation.    Exercising: Yes.    walking/ bowling  Smoker:  Former smoker   Health Maintenance: Pap:  2014 normal per patient  History of abnormal Pap:  Yes years ago, h/o cryosurgery around 46 or 20. MMG:  Years ago Colonoscopy:  Never, she is considering it. BMD:   Never TDaP:  Up to date Gardasil: N/A DES exposure  reports that she has quit smoking. She quit smokeless tobacco use about 9 years ago. 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 One child, 26 year old son, no grandchildren.  Past Medical History  Diagnosis Date  . Hypertension   . Diabetes mellitus   . MI (myocardial infarction) ([place])   . Thyroid disease   . CAD (coronary artery disease)   . Dyslipidemia   . Cardiac arrhythmia     Past Surgical History  Procedure Laterality Date  . Back surgery  5/99 & 6/99  . Coronary angioplasty with stent placement  07/17/1998    stent LAD  . Cardiac catheterization  01/12/2002    stent patent  . Coronary angioplasty with stent placement  01/07/2006    stent RCA,LAD stent patent  . Coronary angioplasty with stent placement  03/18/2006    stent to mid RCA,prox.RCA and LAD stent patent   . Cardiac catheterization  09/03/2006    patent stents  . Thyroid surgery      PT. DENIES/RADIATION  . Tubal ligation    . Cryotherapy      Current Outpatient Prescriptions  Medication Sig Dispense Refill  . albuterol (PROAIR HFA) 108 (90 BASE) MCG/ACT inhaler Inhale 2 puffs into the lungs every 6 (six) hours as needed. For coughing and/or wheezing    . CRESTOR 20 MG tablet TAKE 1 TABLET BY MOUTH DAILY. 30 tablet 6  . diltiazem (CARDIZEM CD) 360 MG 24 hr capsule Take 1 capsule (360 mg total) by mouth daily. 90 capsule 2  . doxepin (SINEQUAN) 10 MG capsule Take 10 mg by mouth at bedtime as needed.     . fexofenadine (ALLEGRA) 180 MG tablet Take 180 mg by mouth daily.     Marland Kitchen thyroid (ARMOUR) 15 MG tablet Take 15 mg by mouth every morning. Take one tablet with one 60mg  tablet for every morning before breakfast for a total 75mg  days Monday-Thursday    . thyroid (ARMOUR) 60 MG tablet Take 60 mg by mouth every morning. Take one 60mg  tablet with a 15mg  tablet for a total 75mg  every morning before breakfast    . valsartan-hydrochlorothiazide (DIOVAN-HCT) 160-12.5 MG per tablet TAKE 1 TABLET BY MOUTH DAILY 90 tablet 3  . ZETIA 10  MG tablet TAKE 1 TABLET BY MOUTH DAILY. 30 tablet 0   No current facility-administered medications for this visit.    Family History  Problem Relation Age of Onset  . Hypertension Mother   . Thyroid disease Mother   . Esophageal cancer Maternal Aunt   . Prostate cancer Maternal Uncle     Review of Systems  Constitutional: Negative.   HENT: Negative.   Eyes: Negative.   Respiratory: Negative.   Cardiovascular: Negative.   Gastrointestinal: Negative.   Endocrine: Negative.   Genitourinary: Negative.        Vaginal odor   Musculoskeletal: Negative.   Skin: Negative.   Allergic/Immunologic: Negative.   Neurological: Negative.   Psychiatric/Behavioral: Negative.     Exam:   BP 130/78 mmHg  Pulse 88  Resp 16  Ht 5\' 4"  (1.626 m)  Wt 203 lb (92.08 kg)   BMI 34.83 kg/m2  Weight change: @WEIGHTCHANGE @ Height:   Height: 5\' 4"  (162.6 cm)  Ht Readings from Last 3 Encounters:  11/22/14 5\' 4"  (1.626 m)  09/24/14 5\' 5"  (1.651 m)  08/14/14 5\' 5"  (1.651 m)    General appearance: alert, cooperative and appears stated age Head: Normocephalic, without obvious abnormality, atraumatic Neck: no adenopathy, supple, symmetrical, trachea midline and thyroid normal to inspection and palpation Lungs: clear to auscultation bilaterally Breasts: normal appearance, no masses or tenderness Heart: regular rate and rhythm Abdomen: soft, non-tender; bowel sounds normal; no masses,  no organomegaly Extremities: extremities normal, atraumatic, no cyanosis or edema Skin: Skin color, texture, turgor normal. No rashes or lesions Lymph nodes: Cervical, supraclavicular, and axillary nodes normal. No abnormal inguinal nodes palpated Neurologic: Grossly normal   Pelvic: External genitalia:  no lesions              Urethra:  normal appearing urethra with no masses, tenderness or lesions              Bartholins and Skenes: normal                 Vagina: grade 2 cystocele with valsalva, no significant uterine prolapse, no rectocele              Cervix: no lesions               Bimanual Exam:  Uterus:  normal size, contour, position, consistency, mobility, non-tender              Adnexa: no mass, fullness, tenderness               Rectovaginal: Confirms               Anus:  normal sphincter tone, no lesions  Chaperone was present for exam.  A:  Well Woman with normal exam  Vaginal odor  Screening for cervical and vaginal cancer  H/O DES exposure  Cystocele   P:   Pap with hpv, cervix and vagina  Wet prep probe  Try vaginal moisturizer  Discussed cystocele, ACOG handout given  Avoid heavy lifting and straining  # to schedule mammogram given  Discussed breast self exam   Discussed calcium and vit D  Recommended she check her vit D with her primary  Discussed  colonoscopy, she will consider.

## 2014-11-23 ENCOUNTER — Other Ambulatory Visit: Payer: Self-pay | Admitting: Obstetrics and Gynecology

## 2014-11-23 LAB — WET PREP BY MOLECULAR PROBE
Candida species: POSITIVE — AB
Gardnerella vaginalis: POSITIVE — AB
Trichomonas vaginosis: NEGATIVE

## 2014-11-23 MED ORDER — METRONIDAZOLE 0.75 % VA GEL: 1.0000 | Freq: Every day | VAGINAL | Status: DC

## 2014-11-23 MED ORDER — FLUCONAZOLE 150 MG PO TABS: 150.0000 mg | ORAL_TABLET | Freq: Once | ORAL | Status: DC

## 2014-11-26 ENCOUNTER — Ambulatory Visit: Payer: 59 | Admitting: Obstetrics and Gynecology

## 2014-11-26 LAB — IPS PAP TEST WITH HPV

## 2014-12-24 ENCOUNTER — Other Ambulatory Visit: Payer: Self-pay | Admitting: Cardiovascular Disease

## 2014-12-24 ENCOUNTER — Ambulatory Visit (INDEPENDENT_AMBULATORY_CARE_PROVIDER_SITE_OTHER): Payer: Self-pay | Admitting: Family Medicine

## 2014-12-24 ENCOUNTER — Encounter: Payer: Self-pay | Admitting: Pharmacist

## 2014-12-24 VITALS — BP 110/70 | Ht 65.0 in | Wt 202.0 lb

## 2014-12-24 DIAGNOSIS — E119 Type 2 diabetes mellitus without complications: Secondary | ICD-10-CM

## 2014-12-24 NOTE — Progress Notes (Signed)
Subjective:  Patient presents today for 3 month diabetes follow-up as part of the employer-sponsored Link to Wellness program.  Current diabetes regimen includes no medications for diabetes. Patient also continues on daily ASA, ARBand statin.  Most recent MD follow-up was with Dr. Chalmers Cater was in September. Patient has a pending appt for January to see Dr. Chalmers Cater.  No med changes or major health changes at this time.     Assessment:  Diabetes: Most recent A1C was 6.5 % which is at goal of less than 7%. Weight is stable from last visit with me.    CBG Review: Patient is checking once daily and brings her meter to the appointment. Logbook review shows that the majority of readings are in the low 100s.   No hypoglycemia noted.   Lifestyle improvements:  Physical Activity-  Still walking three times a week for 15-20 minutes. 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 Still bowling twice a week. She has an exercise bike but hasn't been using it. She says that since Thanksgiving she has been busy and hasn't had time/motivation to use the bike.    Nutrition-  Patient reports no changes since her last appointment.   B- oatmeal (quick oats), glucerna shakes Snack- Belvita bars L- subway flat bread wheat and [place] sandwich; We reviewed carbohydrate information for her favorite sandwich and chips (~60 g Carbohydrates) D- baked pork chop; depends on how much she eats at lunch. She states that her husband likes meat. She always bakes meat- never fries. Sometimes she will eat potatoes or rice.  Snacks/treats- fruit, especially melons;    Follow up with me in 3 months.    Plan/Goals for Next Visit:  1. Schedule an appointment to get your eyes checked.  2. Find a new primary care provider. Some options within the [facility] network- Dr. Moshe Cipro with [facility], [place] (Dr. Lance Sell)); [facility] (Dr. Hilma Favors, Ethlyn Gallery or [place]). Make an  appointment to establish care.  3. Keep up with the bowling and walking. Try to get 150 minutes of activity each week.     Next appointment to see me is: Monday March 20th at 3:30 PM.    Jinny Blossom [name], PharmD, BCPS, CDE [facility] [place] Coordinator [phone]

## 2015-01-04 NOTE — Progress Notes (Signed)
I have reviewed this pharmacist's note and agree  

## 2015-01-28 DIAGNOSIS — E7800 Pure hypercholesterolemia, unspecified: Secondary | ICD-10-CM | POA: Diagnosis not present

## 2015-01-28 DIAGNOSIS — E1165 Type 2 diabetes mellitus with hyperglycemia: Secondary | ICD-10-CM | POA: Diagnosis not present

## 2015-01-28 DIAGNOSIS — E039 Hypothyroidism, unspecified: Secondary | ICD-10-CM | POA: Diagnosis not present

## 2015-02-04 DIAGNOSIS — E7800 Pure hypercholesterolemia, unspecified: Secondary | ICD-10-CM | POA: Diagnosis not present

## 2015-02-04 DIAGNOSIS — E039 Hypothyroidism, unspecified: Secondary | ICD-10-CM | POA: Diagnosis not present

## 2015-02-04 DIAGNOSIS — E1165 Type 2 diabetes mellitus with hyperglycemia: Secondary | ICD-10-CM | POA: Diagnosis not present

## 2015-02-04 DIAGNOSIS — I10 Essential (primary) hypertension: Secondary | ICD-10-CM | POA: Diagnosis not present

## 2015-02-05 MED FILL — INVOKANA 100 MG TABLET: 100 | 30 days supply | Qty: 30 | Fill #0

## 2015-02-13 ENCOUNTER — Ambulatory Visit (INDEPENDENT_AMBULATORY_CARE_PROVIDER_SITE_OTHER): Payer: 59 | Admitting: Cardiovascular Disease

## 2015-02-13 ENCOUNTER — Encounter: Payer: Self-pay | Admitting: Cardiovascular Disease

## 2015-02-13 VITALS — BP 116/70 | HR 71 | Ht 65.0 in | Wt 202.5 lb

## 2015-02-13 DIAGNOSIS — R011 Cardiac murmur, unspecified: Secondary | ICD-10-CM

## 2015-02-13 DIAGNOSIS — I2510 Atherosclerotic heart disease of native coronary artery without angina pectoris: Secondary | ICD-10-CM

## 2015-02-13 DIAGNOSIS — E1159 Type 2 diabetes mellitus with other circulatory complications: Secondary | ICD-10-CM

## 2015-02-13 DIAGNOSIS — E785 Hyperlipidemia, unspecified: Secondary | ICD-10-CM

## 2015-02-13 DIAGNOSIS — I10 Essential (primary) hypertension: Secondary | ICD-10-CM

## 2015-02-13 DIAGNOSIS — E669 Obesity, unspecified: Secondary | ICD-10-CM

## 2015-02-13 NOTE — Progress Notes (Signed)
Patient ID: Shelley [name], female   DOB: 08-25-1955, 59 y.o.   MRN: [id number]    Cardiology Office Note    Date:  02/13/2015   ID:  Shelley [name], DOB [birth year], MRN [id number]  PCP:  Lanette [place], MD  Cardiologist:   Sanda Klein, MD   Chief Complaint  Patient presents with  . Follow-up    16 month//pt states no Sx.    History of Present Illness:  Shelley [name] is a 60 y.o. female with premature onset coronary artery disease, diabetes mellitus, hypertension and obesity who returns for follow-up. She feels well and denies any cardiovascular complaints. She bowls twice a week but does not get much else in the way of physical exercise. After having some side effects Januvia, her glycemic control has been mediocre. She is now taking Invokana. Her most recent hemoglobin A1c was 7.2%. Her latest lipid profile also shows borderline control with an LDL cholesterol of 102 (on combination rosuvastatin and Zetia).  Her electrocardiogram has always been slightly abnormal with varying degrees of T-wave inversion in the inferior and anterolateral leads. Last year these changes were less prominent, they are back again this year with findings very similar to 2014. There does not seem to be any connection between her symptoms and EKG abnormalities.  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 She has a rich history of coronary disease. In 2001, when she was only 60 years old she received a bare-metal stent to the mid LAD artery. In 2008 she presented with an acute myocardial infarction and received a 3 x 24 mm drug-eluting Taxus stent to the LAD artery. Subsequently underwent staged revascularization with a drug-eluting Cypher 2.5 x 13 mm to the distal LAD artery and a 3.0 x 23 mm drug-eluting Cypher stent to the right coronary artery. She has not required repeat coronary angiography since that time. Her most recent functional study in July of 2012 was negative for ischemia, showed  normal left ventricular systolic function with an ejection fraction of 67%. She has never had symptoms of congestive heart failure.   Past Medical History  Diagnosis Date  . Hypertension   . Diabetes mellitus   . MI (myocardial infarction) ([place])   . Thyroid disease   . CAD (coronary artery disease)   . Dyslipidemia   . Cardiac arrhythmia   . DES exposure in utero     Past Surgical History  Procedure Laterality Date  . Back surgery  5/99 & 6/99  . Coronary angioplasty with stent placement  07/17/1998    stent LAD  . Cardiac catheterization  01/12/2002    stent patent  . Coronary angioplasty with stent placement  01/07/2006    stent RCA,LAD stent patent  . Coronary angioplasty with stent placement  03/18/2006    stent to mid RCA,prox.RCA and LAD stent patent  . Cardiac catheterization  09/03/2006    patent stents  . Thyroid surgery      PT. DENIES/RADIATION  . Tubal ligation    . Cryotherapy      Outpatient Prescriptions Prior to Visit  Medication Sig Dispense Refill  . albuterol (PROAIR HFA) 108 (90 BASE) MCG/ACT inhaler Inhale 2 puffs into the lungs every 6 (six) hours as needed. For coughing and/or wheezing    . CRESTOR 20 MG tablet TAKE 1 TABLET BY MOUTH DAILY. 30 tablet 6  . diltiazem (CARDIZEM CD) 360 MG 24 hr capsule Take 1 capsule (360 mg total) by mouth daily. 90 capsule 2  .  ezetimibe (ZETIA) 10 MG tablet Take 1 tablet (10 mg total) by mouth daily. 30 tablet 0  . fexofenadine (ALLEGRA) 180 MG tablet Take 180 mg by mouth daily.     Marland Kitchen TAZTIA XT 180 MG 24 hr capsule TAKE 2 CAPSULES BY MOUTH DAILY. 60 capsule 0  . thyroid (ARMOUR) 15 MG tablet Take 15 mg by mouth every morning. Take one tablet with one 60mg  tablet for every morning before breakfast for a total 75mg  days Monday-Thursday    . thyroid (ARMOUR) 60 MG tablet Take 60 mg by mouth every morning. Take one 60mg  tablet Friday-Sunday. Take one 60 mg tab with one 15mg  tab (75mg ) Monday through Thursday).    .  valsartan-hydrochlorothiazide (DIOVAN-HCT) 160-12.5 MG per tablet TAKE 1 TABLET BY MOUTH DAILY 90 tablet 3   No facility-administered medications prior to visit.     Allergies:   Levothyroxine; Lopressor; Metformin and related; and Norvasc   Social History   Social History  . Marital Status: Married    Spouse Name: N/A  . Number of Children: N/A  . Years of Education: N/A   Occupational History  . EKG [employer]   Social History Main Topics  . Smoking status: Former Research scientist (life sciences)  . Smokeless tobacco: Former Systems developer    Quit date: 01/05/2005  . Alcohol Use: No  . Drug Use: No  . Sexual Activity:    Partners: Male   Other Topics Concern  . None   Social History Narrative     Family History:  The patient's family history includes Esophageal cancer in her maternal aunt; Hypertension in her mother; Prostate cancer in her maternal uncle; Thyroid disease in her mother.   ROS:   Please see the history of present illness.    ROS All other systems reviewed and are negative.   PHYSICAL EXAM:   VS:  BP 116/70 mmHg  Pulse 71  Ht 5\' 5"  (1.651 m)  Wt 91.853 kg (202 lb 8 oz)  BMI 33.70 kg/m2   GEN: Well nourished, well developed, in no acute distress HEENT: normal Neck: no JVD, carotid bruits, or masses Cardiac: RRR; 1/6 early peaking systolic ejection murmur in the aortic focus, no diastolic murmurs, rubs, or gallops,no edema  Respiratory:  clear to auscultation bilaterally, normal work of breathing GI: soft, nontender, nondistended, + BS MS: no deformity or atrophy Skin: warm and dry, no rash Neuro:  Alert and Oriented x 3, Strength and sensation are intact Psych: euthymic mood, full affect  Wt Readings from Last 3 Encounters:  02/13/15 91.853 kg (202 lb 8 oz)  12/24/14 91.627 kg (202 lb)  11/22/14 92.08 kg (203 lb)      Studies/Labs Reviewed:   EKG:  EKG is ordered today.  The ekg ordered today demonstrates normal sinus rhythm, nonspecific minor ST depression and  T-wave inversion in the inferior and anterolateral leads, QTC 419 ms  Recent Labs: No results found for requested labs within last 365 days.   Lipid Panel    Component Value Date/Time   CHOL 197 08/23/2013 1400   TRIG 130 08/23/2013 1400   HDL 43 08/23/2013 1400   CHOLHDL 4.6 08/23/2013 1400   VLDL 26 08/23/2013 1400   LDLCALC 128* 08/23/2013 1400   01/28/2015 Total cholesterol 156, triglycerides 83, HDL 37, LDL 102  ASSESSMENT:    1. Coronary artery disease involving native coronary artery of native heart without angina pectoris   2. Heart murmur   3. Essential hypertension   4. Hyperlipidemia  5. Obesity (BMI 30.0-34.9)   6. 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 CAD: does not have angina pectoris at rest or with activity. Last revascularization procedure was in 2008, last nuclear perfusion study was negative for ischemia in 2012. Has normal LV function. Focus is on risk factor modification. 2. Murmur: Not heard before. May simply represent aortic sclerosis, but I recommend a follow-up echocardiogram (it has been over 10 years since the last evaluation). 3. HTN: Excellent blood pressure control 4. HLP: Ideally her LDL should be less than 70, at least less than 100. She is already on combination therapy. Encourage physical activity and exercise. May need to increase Crestor to 40 mg a day if her LDL is still greater than 100 at the next check. 5. Weight loss would have substantial beneficial effects for both diabetes control and hyperlipidemia. It would probably be the single most important intervention to increase her relatively low HDL cholesterol 6. DM: Hopefully, with the latest medication change she will reach her goal for glycemic control    Medication Adjustments/Labs and Tests Ordered: Current medicines are reviewed at length with the patient today.  Concerns regarding medicines are  outlined above.  Medication changes, Labs and Tests ordered today are listed in the Patient Instructions below. Patient Instructions  Your physician has requested that you have an echocardiogram. Echocardiography is a painless test that uses sound waves to create images of your heart. It provides your doctor with information about the size and shape of your heart and how well your heart's chambers and valves are working. This procedure takes approximately one hour. There are no restrictions for this procedure.  Dr. Sallyanne Kuster recommends that you schedule a follow-up appointment in: ONE YEAR     SignedSanda Klein, MD  02/13/2015 6:08 PM    [address], [place], [place]  [address] Phone: [phone]; Fax: [phone]

## 2015-02-13 NOTE — Patient Instructions (Signed)
Your physician has requested that you have an echocardiogram. Echocardiography is a painless test that uses sound waves to create images of your heart. It provides your doctor with information about the size and shape of your heart and how well your heart's chambers and valves are working. This procedure takes approximately one hour. There are no restrictions for this procedure.  Dr. Croitoru recommends that you schedule a follow-up appointment in: ONE YEAR   

## 2015-02-15 ENCOUNTER — Ambulatory Visit (HOSPITAL_COMMUNITY)
Admission: RE | Admit: 2015-02-15 | Discharge: 2015-02-15 | Disposition: A | Discharge status: Home / Self Care | Payer: 59 | Source: Ambulatory Visit | Attending: Cardiovascular Disease | Admitting: Cardiovascular Disease

## 2015-02-15 DIAGNOSIS — I2510 Atherosclerotic heart disease of native coronary artery without angina pectoris: Secondary | ICD-10-CM | POA: Diagnosis not present

## 2015-02-15 DIAGNOSIS — I252 Old myocardial infarction: Secondary | ICD-10-CM | POA: Diagnosis not present

## 2015-02-15 DIAGNOSIS — E119 Type 2 diabetes mellitus without complications: Secondary | ICD-10-CM | POA: Diagnosis not present

## 2015-02-15 DIAGNOSIS — I10 Essential (primary) hypertension: Secondary | ICD-10-CM | POA: Diagnosis not present

## 2015-02-15 DIAGNOSIS — E785 Hyperlipidemia, unspecified: Secondary | ICD-10-CM | POA: Diagnosis not present

## 2015-02-15 DIAGNOSIS — R011 Cardiac murmur, unspecified: Secondary | ICD-10-CM | POA: Diagnosis not present

## 2015-02-15 NOTE — Progress Notes (Signed)
Echo results released to [facility]

## 2015-02-15 NOTE — Progress Notes (Signed)
  Echocardiogram 2D Echocardiogram has been performed.  Shelley [name] 02/15/2015, 10:25 AM

## 2015-02-25 MED FILL — ARMOUR THYROID 60 MG TABLET: 60 | 90 days supply | Qty: 90 | Fill #3

## 2015-03-07 MED FILL — INVOKANA 100 MG TABLET: 100 | 30 days supply | Qty: 30 | Fill #1

## 2015-03-25 ENCOUNTER — Ambulatory Visit (INDEPENDENT_AMBULATORY_CARE_PROVIDER_SITE_OTHER): Payer: Self-pay | Admitting: Family Medicine

## 2015-03-25 ENCOUNTER — Ambulatory Visit: Payer: Self-pay | Admitting: Pharmacist

## 2015-03-25 ENCOUNTER — Other Ambulatory Visit: Payer: Self-pay | Admitting: Cardiovascular Disease

## 2015-03-25 ENCOUNTER — Encounter: Payer: Self-pay | Admitting: Pharmacist

## 2015-03-25 VITALS — BP 132/90 | Ht 65.0 in | Wt 200.0 lb

## 2015-03-25 DIAGNOSIS — E119 Type 2 diabetes mellitus without complications: Secondary | ICD-10-CM

## 2015-03-25 MED FILL — ROSUVASTATIN CALCIUM 20 MG: 20 | 30 days supply | Qty: 30 | Fill #2

## 2015-03-25 MED FILL — VALSARTAN-HCTZ 160-12.5 MG: 160-12.5 | 90 days supply | Qty: 90 | Fill #1

## 2015-03-25 NOTE — Telephone Encounter (Signed)
Rx request sent to pharmacy.  

## 2015-03-25 NOTE — Progress Notes (Signed)
Subjective:  Patient presents today for 3 month diabetes follow-up as part of the employer-sponsored Link to Wellness program.  Current diabetes regimen includes Invokana 100 mg daily. Patient also continues on daily ARB and statin.  Most recent MD follow-up was with Dr. Chalmers Cater in January. She is unsure what her A1C was at the last visit.   Patient has a pending appt with Dr. Chalmers Cater in June.    Patient states that she recently had her ANA checked through a clinical trial she is participating in and it was elevated. She has a pending appointment with a rheumatologist, Dr. Dossie Der. She seems very worried about what this elevated level might mean for her.   Medication change- Januvia was d/c and Invokana 100 mg daily was started. Januvia was d/c due to side effects that patient couldn't tolerate.   One of her goals last time was to find a new PCP. She is going to go with Dr. Maudie Mercury who has taken over the practice from Dr. Everette Rank.   Assessment:  Diabetes: Most recent A1C was 6.5  % which is at goal of less than 7%. Weight is decreased from last visit with me.    CBG Review: Still checking CBG once daily in the morning. CBG averages are very similar to last visit.  High/Low- 148/90 Denies hypoglycemia.   Fasting CBG is running in the 100-120s. 30 day average 116.   Lifestyle improvements:  Physical Activity-  Bowling still twice a week. She is walking every day 20 minutes daily.    Nutrition-  No major changes. Weight is down 2 pounds since last visit and 8 pounds over the past year. She states she is eating more salads. Still eating oatmeal in the morning.    Follow up with me in 3 months. I will fax over to Dr. Almetta Lovely office and get the most recent A1C results.   Logged into [facility] and showed patient how to claim points for LTW visits.    Plan/Goals for Next Visit:  1. Make an appointment to get your eyes checked.  2. Continue physical activity- aim for at least 150 minutes  each week.  3. Continue to make healthy eating choices.     Next appointment to see me is: Monday June 19th at 3:30 PM.    Jinny Blossom [name], PharmD, BCPS, CDE [facility] [place] Coordinator [phone]

## 2015-03-26 MED FILL — TRUEplus LANCETS 30G MISC: 90 days supply | Qty: 200 | Fill #0

## 2015-03-26 MED FILL — TRUE METRIX GLUCOSE TEST ST: 90 days supply | Qty: 200 | Fill #0

## 2015-03-26 MED FILL — TAZTIA XT 180 MG CAPSULE: 180 | 30 days supply | Qty: 60 | Fill #0

## 2015-03-26 MED FILL — EZETIMIBE 10 MG TABLET: 10 | 30 days supply | Qty: 30 | Fill #0

## 2015-04-04 MED FILL — INVOKANA 100 MG TABLET: 100 | 30 days supply | Qty: 30 | Fill #2

## 2015-04-18 NOTE — Progress Notes (Signed)
I have reviewed this pharmacist's note and agree  

## 2015-04-30 DIAGNOSIS — R768 Other specified abnormal immunological findings in serum: Secondary | ICD-10-CM | POA: Diagnosis not present

## 2015-05-06 MED FILL — INVOKANA 100 MG TABLET: 100 | 30 days supply | Qty: 30 | Fill #3

## 2015-05-29 MED FILL — ARMOUR THYROID 60 MG TABLET: 60 | 90 days supply | Qty: 90 | Fill #0

## 2015-06-05 MED FILL — INVOKANA 100 MG TABLET: 100 | 90 days supply | Qty: 90 | Fill #4

## 2015-06-13 DIAGNOSIS — I10 Essential (primary) hypertension: Secondary | ICD-10-CM | POA: Diagnosis not present

## 2015-06-13 DIAGNOSIS — E039 Hypothyroidism, unspecified: Secondary | ICD-10-CM | POA: Diagnosis not present

## 2015-06-13 DIAGNOSIS — E7800 Pure hypercholesterolemia, unspecified: Secondary | ICD-10-CM | POA: Diagnosis not present

## 2015-06-13 DIAGNOSIS — E1165 Type 2 diabetes mellitus with hyperglycemia: Secondary | ICD-10-CM | POA: Diagnosis not present

## 2015-06-24 ENCOUNTER — Other Ambulatory Visit: Payer: Self-pay | Admitting: Pharmacist

## 2015-06-24 ENCOUNTER — Encounter: Payer: Self-pay | Admitting: Pharmacist

## 2015-06-24 VITALS — BP 122/83 | Wt 198.0 lb

## 2015-06-24 DIAGNOSIS — E119 Type 2 diabetes mellitus without complications: Secondary | ICD-10-CM

## 2015-06-24 NOTE — Patient Outreach (Signed)
Subjective:  Patient presents today for 3 month diabetes follow-up as part of the employer-sponsored Link to Wellness program.  Current diabetes regimen includes invokana 100mg  daily. Also continues on daily ARB and statin.  Most recent MD follow-up was Jun 2017 with Dr. Chalmers Cater. Patient has a pending appt for Dec 2017. No med changes or major health changes at this time.   Feels she is doing well. Has been trying hard to manage her diabetes. Competing with niece for fitness goals.   Denies neuropathy or visual changes. Checks feet daily; no issues. Nocturia at least once per night. No recent eye exam.  Assessment:  Diabetes: Most recent A1C was  6.5 % which is at goal of less than 7%. Weight is decreased from last visit with me. Continues to do well with lifestyle changes and is highly motivated.    CBG Review: Checking BG every morning.   High/low: 142/96  14 day avg ~110.  Lifestyle improvements:  Physical Activity-  Trying to walk 10000 steps a day. Continues to bowl twice a week.    Nutrition-  Reading labels more frequently. Drinking more water; 3 botlles per day. Avoids sodas.   B - Glucerna shake,  Oatmeal on occasion, belvita bars L - Salad and fruit, chicken, [place] (biggest meal) D - Eats out on occasion; orders a sald. Baked chicken or pork, vegetables     Plan/Goals for Next Visit:  1. Continue to keep up with goal of [address] steps per day.  2. Continue to make healthy food choices.  3. Make an appt for an eye exam.    Next appointment to see me is: Monday August 21 at 3:30PM   Stephens November, PharmD Clinical Pharmacy Resident 4:32 PM, 06/24/2015

## 2015-06-28 DIAGNOSIS — L821 Other seborrheic keratosis: Secondary | ICD-10-CM | POA: Diagnosis not present

## 2015-06-28 DIAGNOSIS — L300 Nummular dermatitis: Secondary | ICD-10-CM | POA: Diagnosis not present

## 2015-06-28 MED FILL — TRIAMCINOLONE 0.1% CREAM: 0.1 | 20 days supply | Qty: 80 | Fill #0

## 2015-07-30 DIAGNOSIS — R768 Other specified abnormal immunological findings in serum: Secondary | ICD-10-CM | POA: Diagnosis not present

## 2015-07-31 ENCOUNTER — Encounter (HOSPITAL_COMMUNITY): Payer: Self-pay | Admitting: Emergency Medicine

## 2015-07-31 ENCOUNTER — Encounter (HOSPITAL_COMMUNITY)
Admission: EM | Disposition: A | Discharge status: Home / Self Care | Payer: Self-pay | Source: Home / Self Care | Attending: Cardiovascular Disease

## 2015-07-31 ENCOUNTER — Emergency Department (HOSPITAL_COMMUNITY): Payer: 59

## 2015-07-31 ENCOUNTER — Inpatient Hospital Stay (HOSPITAL_COMMUNITY)
Admission: EM | Admit: 2015-07-31 | Discharge: 2015-08-03 | DRG: 246 | Disposition: A | Discharge status: Home / Self Care | Payer: 59 | Attending: Cardiovascular Disease | Admitting: Cardiovascular Disease

## 2015-07-31 DIAGNOSIS — R079 Chest pain, unspecified: Secondary | ICD-10-CM | POA: Diagnosis not present

## 2015-07-31 DIAGNOSIS — Z79899 Other long term (current) drug therapy: Secondary | ICD-10-CM | POA: Diagnosis not present

## 2015-07-31 DIAGNOSIS — R000 Tachycardia, unspecified: Secondary | ICD-10-CM | POA: Diagnosis not present

## 2015-07-31 DIAGNOSIS — Z7982 Long term (current) use of aspirin: Secondary | ICD-10-CM | POA: Diagnosis not present

## 2015-07-31 DIAGNOSIS — I2510 Atherosclerotic heart disease of native coronary artery without angina pectoris: Secondary | ICD-10-CM | POA: Diagnosis present

## 2015-07-31 DIAGNOSIS — I25110 Atherosclerotic heart disease of native coronary artery with unstable angina pectoris: Secondary | ICD-10-CM | POA: Diagnosis not present

## 2015-07-31 DIAGNOSIS — R0682 Tachypnea, not elsewhere classified: Secondary | ICD-10-CM | POA: Diagnosis not present

## 2015-07-31 DIAGNOSIS — I959 Hypotension, unspecified: Secondary | ICD-10-CM | POA: Diagnosis not present

## 2015-07-31 DIAGNOSIS — I2119 ST elevation (STEMI) myocardial infarction involving other coronary artery of inferior wall: Secondary | ICD-10-CM | POA: Diagnosis present

## 2015-07-31 DIAGNOSIS — E785 Hyperlipidemia, unspecified: Secondary | ICD-10-CM | POA: Diagnosis present

## 2015-07-31 DIAGNOSIS — Z87891 Personal history of nicotine dependence: Secondary | ICD-10-CM

## 2015-07-31 DIAGNOSIS — E119 Type 2 diabetes mellitus without complications: Secondary | ICD-10-CM | POA: Diagnosis present

## 2015-07-31 DIAGNOSIS — Z955 Presence of coronary angioplasty implant and graft: Secondary | ICD-10-CM | POA: Diagnosis not present

## 2015-07-31 DIAGNOSIS — I25118 Atherosclerotic heart disease of native coronary artery with other forms of angina pectoris: Secondary | ICD-10-CM | POA: Diagnosis not present

## 2015-07-31 DIAGNOSIS — I10 Essential (primary) hypertension: Secondary | ICD-10-CM | POA: Diagnosis not present

## 2015-07-31 DIAGNOSIS — E669 Obesity, unspecified: Secondary | ICD-10-CM | POA: Diagnosis present

## 2015-07-31 DIAGNOSIS — Z6832 Body mass index (BMI) 32.0-32.9, adult: Secondary | ICD-10-CM | POA: Diagnosis not present

## 2015-07-31 DIAGNOSIS — Z888 Allergy status to other drugs, medicaments and biological substances status: Secondary | ICD-10-CM | POA: Diagnosis not present

## 2015-07-31 DIAGNOSIS — I2111 ST elevation (STEMI) myocardial infarction involving right coronary artery: Secondary | ICD-10-CM | POA: Diagnosis not present

## 2015-07-31 DIAGNOSIS — Y838 Other surgical procedures as the cause of abnormal reaction of the patient, or of later complication, without mention of misadventure at the time of the procedure: Secondary | ICD-10-CM | POA: Diagnosis present

## 2015-07-31 DIAGNOSIS — E876 Hypokalemia: Secondary | ICD-10-CM | POA: Diagnosis not present

## 2015-07-31 DIAGNOSIS — T82867A Thrombosis of cardiac prosthetic devices, implants and grafts, initial encounter: Secondary | ICD-10-CM | POA: Diagnosis present

## 2015-07-31 DIAGNOSIS — E1159 Type 2 diabetes mellitus with other circulatory complications: Secondary | ICD-10-CM

## 2015-07-31 DIAGNOSIS — I469 Cardiac arrest, cause unspecified: Secondary | ICD-10-CM | POA: Diagnosis not present

## 2015-07-31 HISTORY — PX: CARDIAC CATHETERIZATION: SHX172

## 2015-07-31 LAB — PROTIME-INR
INR: 1.03
PROTHROMBIN TIME: 13.5 s (ref 11.4–15.2)

## 2015-07-31 LAB — CBC
HCT: 49 % — ABNORMAL HIGH (ref 36.0–46.0)
Hemoglobin: 16.8 g/dL — ABNORMAL HIGH (ref 12.0–15.0)
MCH: 29.3 pg (ref 26.0–34.0)
MCHC: 34.3 g/dL (ref 30.0–36.0)
MCV: 85.4 fL (ref 78.0–100.0)
Platelets: 296 10*3/uL (ref 150–400)
RBC: 5.74 MIL/uL — AB (ref 3.87–5.11)
RDW: 14.2 % (ref 11.5–15.5)
WBC: 9.8 10*3/uL (ref 4.0–10.5)

## 2015-07-31 LAB — BASIC METABOLIC PANEL
Anion gap: 6 (ref 5–15)
BUN: 16 mg/dL (ref 6–20)
CHLORIDE: 103 mmol/L (ref 101–111)
CO2: 26 mmol/L (ref 22–32)
Calcium: 8.9 mg/dL (ref 8.9–10.3)
Creatinine, Ser: 1.09 mg/dL — ABNORMAL HIGH (ref 0.44–1.00)
GFR calc non Af Amer: 54 mL/min — ABNORMAL LOW (ref >60)
Glucose, Bld: 127 mg/dL — ABNORMAL HIGH (ref 65–99)
POTASSIUM: 3.2 mmol/L — AB (ref 3.5–5.1)
SODIUM: 135 mmol/L (ref 135–145)

## 2015-07-31 LAB — TROPONIN I
TROPONIN I: 0.03 ng/mL — AB (ref <0.03)
Troponin I: 0.05 ng/mL (ref <0.03)

## 2015-07-31 LAB — MRSA PCR SCREENING: MRSA BY PCR: NEGATIVE

## 2015-07-31 LAB — GLUCOSE, CAPILLARY: GLUCOSE-CAPILLARY: 111 mg/dL — AB (ref 65–99)

## 2015-07-31 SURGERY — LEFT HEART CATH AND CORONARY ANGIOGRAPHY
Anesthesia: LOCAL

## 2015-07-31 MED ORDER — MIDAZOLAM HCL 2 MG/2ML IJ SOLN
INTRAMUSCULAR | Status: DC | PRN
  Administered 2015-07-31: 1 mg via INTRAVENOUS

## 2015-07-31 MED ORDER — HEPARIN (PORCINE) IN NACL 100-0.45 UNIT/ML-% IJ SOLN
15.0000 [IU]/kg/h | Freq: Once | INTRAMUSCULAR | Status: DC
  Administered 2015-07-31: 18:00:00 via INTRAVENOUS

## 2015-07-31 MED ORDER — INSULIN ASPART 100 UNIT/ML ~~LOC~~ SOLN: 0.0000 [IU] | Freq: Three times a day (TID) | SUBCUTANEOUS | Status: DC

## 2015-07-31 MED ORDER — ACETAMINOPHEN 325 MG PO TABS: 650.0000 mg | ORAL_TABLET | ORAL | Status: DC | PRN

## 2015-07-31 MED ORDER — CANAGLIFLOZIN 100 MG PO TABS
100.0000 mg | ORAL_TABLET | Freq: Every day | ORAL | Status: DC
Start: 2015-08-01 — End: 2015-08-03
  Administered 2015-08-01 – 2015-08-03 (×3): 100 mg via ORAL
  Filled 2015-07-31 (×4): qty 1

## 2015-07-31 MED ORDER — HEPARIN SODIUM (PORCINE) 5000 UNIT/ML IJ SOLN
INTRAMUSCULAR | Status: AC
  Administered 2015-07-31: 4000 [IU]
  Filled 2015-07-31: qty 1

## 2015-07-31 MED ORDER — EZETIMIBE 10 MG PO TABS
10.0000 mg | ORAL_TABLET | Freq: Every day | ORAL | Status: DC
  Administered 2015-08-01 – 2015-08-03 (×3): 10 mg via ORAL
  Filled 2015-07-31 (×3): qty 1

## 2015-07-31 MED ORDER — SODIUM CHLORIDE 0.9 % IV SOLN
INTRAVENOUS | Status: DC | PRN
  Administered 2015-07-31: 100 mL/h via INTRAVENOUS

## 2015-07-31 MED ORDER — ALBUTEROL SULFATE (2.5 MG/3ML) 0.083% IN NEBU: 3.0000 mL | INHALATION_SOLUTION | Freq: Four times a day (QID) | RESPIRATORY_TRACT | Status: DC | PRN

## 2015-07-31 MED ORDER — MORPHINE SULFATE (PF) 4 MG/ML IV SOLN
4.0000 mg | INTRAVENOUS | Status: DC | PRN
Start: 2015-07-31 — End: 2015-07-31
  Administered 2015-07-31: 4 mg via INTRAVENOUS
  Filled 2015-07-31: qty 1

## 2015-07-31 MED ORDER — BIVALIRUDIN 250 MG IV SOLR
INTRAVENOUS | Status: AC
  Filled 2015-07-31: qty 250

## 2015-07-31 MED ORDER — SODIUM CHLORIDE 0.9 % IV SOLN
250.0000 mL | INTRAVENOUS | Status: DC | PRN
Start: 2015-07-31 — End: 2015-08-01

## 2015-07-31 MED ORDER — ASPIRIN 81 MG PO CHEW
81.0000 mg | CHEWABLE_TABLET | Freq: Every day | ORAL | Status: DC
  Administered 2015-08-01 – 2015-08-03 (×2): 81 mg via ORAL
  Filled 2015-07-31 (×3): qty 1

## 2015-07-31 MED ORDER — HEPARIN (PORCINE) IN NACL 2-0.9 UNIT/ML-% IJ SOLN
INTRAMUSCULAR | Status: AC
  Filled 2015-07-31: qty 1000

## 2015-07-31 MED ORDER — FENTANYL CITRATE (PF) 100 MCG/2ML IJ SOLN
INTRAMUSCULAR | Status: AC
  Filled 2015-07-31: qty 2

## 2015-07-31 MED ORDER — VERAPAMIL HCL 2.5 MG/ML IV SOLN
INTRAVENOUS | Status: AC
  Filled 2015-07-31: qty 2

## 2015-07-31 MED ORDER — SODIUM CHLORIDE 0.9% FLUSH
3.0000 mL | Freq: Two times a day (BID) | INTRAVENOUS | Status: DC
  Administered 2015-07-31 – 2015-08-01 (×3): 3 mL via INTRAVENOUS

## 2015-07-31 MED ORDER — HEPARIN (PORCINE) IN NACL 2-0.9 UNIT/ML-% IJ SOLN
INTRAMUSCULAR | Status: DC | PRN
  Administered 2015-07-31: 1500 mL

## 2015-07-31 MED ORDER — ATROPINE SULFATE 1 MG/10ML IJ SOSY
PREFILLED_SYRINGE | INTRAMUSCULAR | Status: AC
  Filled 2015-07-31: qty 10

## 2015-07-31 MED ORDER — ONDANSETRON HCL 4 MG/2ML IJ SOLN
4.0000 mg | Freq: Once | INTRAMUSCULAR | Status: AC
  Administered 2015-07-31: 4 mg via INTRAVENOUS
  Filled 2015-07-31: qty 2

## 2015-07-31 MED ORDER — MORPHINE SULFATE (PF) 2 MG/ML IV SOLN: 2.0000 mg | INTRAVENOUS | Status: DC | PRN

## 2015-07-31 MED ORDER — ONDANSETRON HCL 4 MG/2ML IJ SOLN
4.0000 mg | Freq: Four times a day (QID) | INTRAMUSCULAR | Status: DC | PRN
Start: 2015-07-31 — End: 2015-08-03

## 2015-07-31 MED ORDER — ROSUVASTATIN CALCIUM 20 MG PO TABS
20.0000 mg | ORAL_TABLET | Freq: Every day | ORAL | Status: DC
  Administered 2015-07-31 – 2015-08-03 (×4): 20 mg via ORAL
  Filled 2015-07-31: qty 2
  Filled 2015-07-31 (×3): qty 1

## 2015-07-31 MED ORDER — TICAGRELOR 90 MG PO TABS
90.0000 mg | ORAL_TABLET | Freq: Two times a day (BID) | ORAL | Status: DC
  Administered 2015-08-01 – 2015-08-03 (×5): 90 mg via ORAL
  Filled 2015-07-31 (×5): qty 1

## 2015-07-31 MED ORDER — THYROID 60 MG PO TABS
60.0000 mg | ORAL_TABLET | ORAL | Status: DC
  Administered 2015-08-03: 07:00:00 60 mg via ORAL
  Filled 2015-07-31 (×3): qty 1

## 2015-07-31 MED ORDER — TICAGRELOR 90 MG PO TABS
ORAL_TABLET | ORAL | Status: AC
  Filled 2015-07-31: qty 2

## 2015-07-31 MED ORDER — FENTANYL CITRATE (PF) 100 MCG/2ML IJ SOLN
INTRAMUSCULAR | Status: DC | PRN
  Administered 2015-07-31: 25 ug via INTRAVENOUS

## 2015-07-31 MED ORDER — SODIUM CHLORIDE 0.9 % IV SOLN
INTRAVENOUS | Status: AC
  Administered 2015-07-31: 20:00:00 via INTRAVENOUS

## 2015-07-31 MED ORDER — MIDAZOLAM HCL 2 MG/2ML IJ SOLN
INTRAMUSCULAR | Status: AC
  Filled 2015-07-31: qty 2

## 2015-07-31 MED ORDER — THYROID 30 MG PO TABS
75.0000 mg | ORAL_TABLET | ORAL | Status: DC
  Administered 2015-08-01: 75 mg via ORAL
  Filled 2015-07-31: qty 3

## 2015-07-31 MED ORDER — TICAGRELOR 90 MG PO TABS
ORAL_TABLET | ORAL | Status: DC | PRN
  Administered 2015-07-31: 180 mg via ORAL

## 2015-07-31 MED ORDER — BIVALIRUDIN BOLUS VIA INFUSION - CUPID
INTRAVENOUS | Status: DC | PRN
  Administered 2015-07-31: 67.35 mg via INTRAVENOUS

## 2015-07-31 MED ORDER — LIDOCAINE HCL (PF) 1 % IJ SOLN
INTRAMUSCULAR | Status: AC
  Filled 2015-07-31: qty 30

## 2015-07-31 MED ORDER — HEPARIN (PORCINE) IN NACL 100-0.45 UNIT/ML-% IJ SOLN
INTRAMUSCULAR | Status: AC
  Filled 2015-07-31: qty 250

## 2015-07-31 MED ORDER — LIDOCAINE HCL (PF) 1 % IJ SOLN
INTRAMUSCULAR | Status: DC | PRN
  Administered 2015-07-31: 2 mL

## 2015-07-31 MED ORDER — HEPARIN SODIUM (PORCINE) 5000 UNIT/ML IJ SOLN: 60.0000 [IU]/kg | Freq: Once | INTRAMUSCULAR | Status: DC

## 2015-07-31 MED ORDER — ATROPINE SULFATE 1 MG/10ML IJ SOSY
PREFILLED_SYRINGE | INTRAMUSCULAR | Status: DC | PRN
  Administered 2015-07-31: 1 mg via INTRAVENOUS

## 2015-07-31 MED ORDER — LORATADINE 10 MG PO TABS: 10.0000 mg | ORAL_TABLET | Freq: Every day | ORAL | Status: DC

## 2015-07-31 MED ORDER — ASPIRIN 81 MG PO CHEW
324.0000 mg | CHEWABLE_TABLET | Freq: Once | ORAL | Status: AC
  Administered 2015-07-31: 324 mg via ORAL
  Filled 2015-07-31: qty 4

## 2015-07-31 MED ORDER — DOPAMINE-DEXTROSE 3.2-5 MG/ML-% IV SOLN
INTRAVENOUS | Status: DC | PRN
  Administered 2015-07-31: 5 ug/kg/min via INTRAVENOUS

## 2015-07-31 MED ORDER — SODIUM CHLORIDE 0.9 % IV SOLN
INTRAVENOUS | Status: DC | PRN
  Administered 2015-07-31: 1.75 mg/kg/h via INTRAVENOUS

## 2015-07-31 MED ORDER — IOPAMIDOL (ISOVUE-370) INJECTION 76%
INTRAVENOUS | Status: AC
  Filled 2015-07-31: qty 100

## 2015-07-31 MED ORDER — SODIUM CHLORIDE 0.9% FLUSH: 3.0000 mL | INTRAVENOUS | Status: DC | PRN

## 2015-07-31 MED ORDER — SODIUM CHLORIDE 0.9 % IV SOLN
0.2500 mg/kg/h | INTRAVENOUS | Status: AC
  Administered 2015-07-31: 0.25 mg/kg/h via INTRAVENOUS
  Filled 2015-07-31: qty 250

## 2015-07-31 MED ORDER — DOPAMINE-DEXTROSE 3.2-5 MG/ML-% IV SOLN
INTRAVENOUS | Status: AC
  Filled 2015-07-31: qty 250

## 2015-07-31 MED ORDER — IOPAMIDOL (ISOVUE-370) INJECTION 76%
INTRAVENOUS | Status: DC | PRN
  Administered 2015-07-31: 95 mL via INTRAVENOUS

## 2015-07-31 MED ORDER — HEPARIN (PORCINE) IN NACL 100-0.45 UNIT/ML-% IJ SOLN: 1350.0000 [IU]/h | Freq: Once | INTRAMUSCULAR | Status: DC

## 2015-07-31 MED ORDER — VERAPAMIL HCL 2.5 MG/ML IV SOLN
INTRAVENOUS | Status: DC | PRN
  Administered 2015-07-31: 10 mL via INTRA_ARTERIAL

## 2015-07-31 MED ORDER — NITROGLYCERIN IN D5W 200-5 MCG/ML-% IV SOLN: 5.0000 ug/min | INTRAVENOUS | Status: DC

## 2015-07-31 SURGICAL SUPPLY — 19 items
BALLN EMERGE MR 2.5X12 (BALLOONS) ×2
BALLN ~~LOC~~ EMERGE MR 3.5X12 (BALLOONS) ×2
BALLOON EMERGE MR 2.5X12 (BALLOONS) IMPLANT
BALLOON ~~LOC~~ EMERGE MR 3.5X12 (BALLOONS) IMPLANT
CATH INFINITI 5 FR JL3.5 (CATHETERS) ×1 IMPLANT
CATH INFINITI 5FR ANG PIGTAIL (CATHETERS) ×1 IMPLANT
CATH INFINITI JR4 5F (CATHETERS) ×1 IMPLANT
CATH VISTA GUIDE 6FR JR4 (CATHETERS) ×1 IMPLANT
DEVICE RAD COMP TR BAND LRG (VASCULAR PRODUCTS) ×1 IMPLANT
GLIDESHEATH SLEND SS 6F .021 (SHEATH) ×1 IMPLANT
KIT ENCORE 26 ADVANTAGE (KITS) ×1 IMPLANT
KIT HEART LEFT (KITS) ×2 IMPLANT
PACK CARDIAC CATHETERIZATION (CUSTOM PROCEDURE TRAY) ×2 IMPLANT
STENT PROMUS PREM MR 3.0X16 (Permanent Stent) ×1 IMPLANT
SYR MEDRAD MARK V 150ML (SYRINGE) ×2 IMPLANT
TRANSDUCER W/STOPCOCK (MISCELLANEOUS) ×2 IMPLANT
TUBING CIL FLEX 10 FLL-RA (TUBING) ×2 IMPLANT
WIRE COUGAR XT STRL 190CM (WIRE) ×1 IMPLANT
WIRE SAFE-T 1.5MM-J .035X260CM (WIRE) ×1 IMPLANT

## 2015-07-31 NOTE — ED Notes (Signed)
Upon re-entering pt's room, EKG changes noticed by this RN. Another EKG performed and given to Dr. Jeneen Rinks. ST elevation seen on EKG.

## 2015-07-31 NOTE — ED Provider Notes (Addendum)
[facility] DEPT Provider Note   CSN: [id number] Arrival date & time: 07/31/15  1710  First Provider Contact:  17:19pm       History   Chief Complaint Chief Complaint  Patient presents with  . Chest Pain    HPI Shelley [name] is a 60 y.o. female.  She has a history of premature coronary disease with her first intervention at age 89 with an LAD stent. Presented later with acute anterior MI and underwent additional LAD stent, and staged right coronary stent. Follows with Dr. Jacinta Shoe.    She has been in her normal state of health until 20 minutes before arrival here. She was sitting at rest at home. She had done some work earlier the day without symptoms. While sitting at rest she developed a sudden severe pain in her chest and into her left arm. She states it is "heavy throbbing". Ultimately, she states it is similar to pain she has had in the past with her heart. Has had no recent exertional pain. No palpitations or syncopal episodes. She states she felt sweaty and nauseated. Denies shortness of breath. No jaw or back pain.    HPI  Past Medical History:  Diagnosis Date  . CAD (coronary artery disease)   . Cardiac arrhythmia   . DES exposure in utero   . Diabetes mellitus   . Dyslipidemia   . Hypertension   . MI (myocardial infarction) ([place])   . Thyroid disease     Patient Active Problem List   Diagnosis Date Noted  . DES exposure in utero   . CAD (coronary artery disease) 08/30/2012  . HTN (hypertension) 08/30/2012  . Obesity (BMI 30.0-34.9) 08/30/2012  . Hyperlipidemia 08/30/2012  . Diabetes ([place]) 03/14/2012    Past Surgical History:  Procedure Laterality Date  . BACK SURGERY  5/99 & 6/99  . CARDIAC CATHETERIZATION  01/12/2002   stent patent  . CARDIAC CATHETERIZATION  09/03/2006   patent stents  . CORONARY ANGIOPLASTY WITH STENT PLACEMENT  07/17/1998   stent LAD  . CORONARY ANGIOPLASTY WITH STENT PLACEMENT  01/07/2006   stent RCA,LAD stent patent  .  CORONARY ANGIOPLASTY WITH STENT PLACEMENT  03/18/2006   stent to mid RCA,prox.RCA and LAD stent patent  . CRYOTHERAPY    . THYROID SURGERY     PT. DENIES/RADIATION  . TUBAL LIGATION      OB History    Gravida Para Term Preterm AB Living   1 1 1     1    SAB TAB Ectopic Multiple Live Births                   Home Medications    Prior to Admission medications   Medication Sig Start Date End Date Taking? Authorizing Provider  aspirin EC 81 MG tablet Take 162 mg by mouth once.   Yes Historical Provider, MD  albuterol (PROAIR HFA) 108 (90 BASE) MCG/ACT inhaler Inhale 2 puffs into the lungs every 6 (six) hours as needed. For coughing and/or wheezing    Historical Provider, MD  canagliflozin (INVOKANA) 100 MG TABS tablet Take 100 mg by mouth daily before breakfast.    Historical Provider, MD  CRESTOR 20 MG tablet TAKE 1 TABLET BY MOUTH DAILY. 06/25/14   Isaiah Serge, NP  ezetimibe (ZETIA) 10 MG tablet TAKE 1 TABLET (10 MG TOTAL) BY MOUTH DAILY. 03/25/15   Mihai Croitoru, MD  fexofenadine (ALLEGRA) 180 MG tablet Take 180 mg by mouth daily.  Historical Provider, MD  TAZTIA XT 180 MG 24 hr capsule TAKE 2 CAPSULES BY MOUTH DAILY. 03/25/15   Mihai Croitoru, MD  thyroid (ARMOUR) 15 MG tablet Take 15 mg by mouth every morning. Take one tablet with one 60mg  tablet for every morning before breakfast for a total 75mg  days Monday-Thursday    Historical Provider, MD  thyroid (ARMOUR) 60 MG tablet Take 60 mg by mouth every morning. Take one 60mg  tablet Friday-Sunday. Take one 60 mg tab with one 15mg  tab (75mg ) Monday through Thursday).    Historical Provider, MD  valsartan-hydrochlorothiazide (DIOVAN-HCT) 160-12.5 MG per tablet TAKE 1 TABLET BY MOUTH DAILY 06/25/14   Sanda Klein, MD    Family History Family History  Problem Relation Age of Onset  . Hypertension Mother   . Thyroid disease Mother   . Esophageal cancer Maternal Aunt   . Prostate cancer Maternal Uncle     Social History Social  History  Substance Use Topics  . Smoking status: Former Research scientist (life sciences)  . Smokeless tobacco: Former Systems developer    Quit date: 01/05/2005  . Alcohol use No     Allergies   Levothyroxine; Lopressor [metoprolol tartrate]; Metformin and related; and Norvasc [amlodipine besylate]   Review of Systems Review of Systems  Constitutional: Positive for diaphoresis. Negative for appetite change, chills, fatigue and fever.  HENT: Negative for mouth sores, sore throat and trouble swallowing.   Eyes: Negative for visual disturbance.  Respiratory: Negative for cough, chest tightness, shortness of breath and wheezing.   Cardiovascular: Positive for chest pain.  Gastrointestinal: Positive for nausea. Negative for abdominal distention, abdominal pain, diarrhea and vomiting.  Endocrine: Negative for polydipsia, polyphagia and polyuria.  Genitourinary: Negative for dysuria, frequency and hematuria.  Musculoskeletal: Negative for gait problem.  Skin: Negative for color change, pallor and rash.  Neurological: Negative for dizziness, syncope, light-headedness and headaches.  Hematological: Does not bruise/bleed easily.  Psychiatric/Behavioral: Negative for behavioral problems and confusion.     Physical Exam Updated Vital Signs BP 131/96   Pulse 92   Resp 21   SpO2 98%   Physical Exam  Constitutional: She is oriented to person, place, and time. She appears well-developed and well-nourished. She appears distressed.  HENT:  Head: Normocephalic.  Eyes: Conjunctivae are normal. Pupils are equal, round, and reactive to light. No scleral icterus.  Neck: Normal range of motion. Neck supple. No thyromegaly present.  Cardiovascular: Normal rate and regular rhythm.  Exam reveals no gallop and no friction rub.   No murmur heard. Pulmonary/Chest: Effort normal and breath sounds normal. No respiratory distress. She has no wheezes. She has no rales.  Clear bilateral breath sounds.  Abdominal: Soft. Bowel sounds are normal.  She exhibits no distension. There is no tenderness. There is no rebound.  Musculoskeletal: Normal range of motion.  Neurological: She is alert and oriented to person, place, and time.  Skin: Skin is warm. No rash noted. She is diaphoretic.  Psychiatric: She has a normal mood and affect. Her behavior is normal.     ED Treatments / Results  Labs (all labs ordered are listed, but only abnormal results are displayed) Labs Reviewed  CBC  BASIC METABOLIC PANEL  TROPONIN I  PROTIME-INR    EKG  EKG Interpretation  Date/Time:  Wednesday July 31 2015 17:14:31 EDT Ventricular Rate:  87 PR Interval:    QRS Duration: 81 QT Interval:  347 QTC Calculation: 418 R Axis:   7 Text Interpretation:  Sinus rhythm Probable left atrial enlargement Abnrm  T, consider ischemia, anterolateral lds  Similar to prior Confirmed by Jeneen Rinks  MD, [place] ([id number]) on 07/31/2015 5:32:57 PM       Radiology No results found.  Procedures Procedures (including critical care time)  Medications Ordered in ED Medications  morphine 4 MG/ML injection 4 mg (4 mg Intravenous Given 07/31/15 1734)  heparin 5000 UNIT/ML injection (not administered)  heparin 100-0.45 UNIT/ML-% infusion (not administered)  heparin injection 60 Units/kg (not administered)  heparin ADULT infusion 100 units/mL (25000 units/253mL sodium chloride 0.45%) (not administered)  aspirin chewable tablet 324 mg (324 mg Oral Given 07/31/15 1733)  ondansetron (ZOFRAN) injection 4 mg (4 mg Intravenous Given 07/31/15 1734)     Initial Impression / Assessment and Plan / ED Course  I have reviewed the triage vital signs and the nursing notes.  Pertinent labs & imaging results that were available during my care of the patient were reviewed by me and considered in my medical decision making (see chart for details).  Clinical Course    I was given patient's EKG. I reviewed her cardiac history briefly. I examined her. Her story is concerning for ACS. She has  previous LAD, and RCA stents. Initial EKG shows lateral T-wave inversions which have been present on her EKG for several years. Her most recent cardiology office visit describes diffuse T-wave inversions.  After leaving the room I have requested another EKG. I've also requested heparin, nitroglycerin, and aspirin. She only took 2 aspirin today. Also morphine and Zofran. Repeat EKG done at 17:27 shows obvious ST elevations inferiorly with her cervical changes anteriorly consistent with acute inferior wall MI. I mmediately called for code "STEMI". Patient has had heparin bolus and drip. I've held nitroglycerin as she has apparent posterior wall involvement. Her pressures are [id number] systolic. Call returned to me immediately per usual by Dr. Julianne Handler, patient's presentation briefly discussed. He immediately accepts the patient in transfer. [facility] is unavailable. Local EMS has been called.  Final Clinical Impressions(s) / ED Diagnoses   Final diagnoses:  ST elevation myocardial infarction involving right coronary artery ([place])   CRITICAL CARE Performed by: Tanna Furry JOSEPH   Total critical care time: 5:19 PM, until 5:49 PM, 30 minutes including interpretation of multiple EKGs, rapid consultation of cardiology, heparin bolus and drip.  Critical care time was exclusive of separately billable procedures and treating other patients.  Critical care was necessary to treat or prevent imminent or life-threatening deterioration.  Critical care was time spent personally by me on the following activities: development of treatment plan with patient and/or surrogate as well as nursing, discussions with consultants, evaluation of patient's response to treatment, examination of patient, obtaining history from patient or surrogate, ordering and performing treatments and interventions, ordering and review of laboratory studies, ordering and review of radiographic studies, pulse oximetry and re-evaluation of  patient's condition. care New Prescriptions New Prescriptions   No medications on file     Tanna Furry, MD 07/31/15 1748    Tanna Furry, MD 07/31/15 1925

## 2015-07-31 NOTE — ED Triage Notes (Signed)
Patient complaining of chest pain radiating down left arm starting 20 minutes prior to arrival to ED.

## 2015-07-31 NOTE — ED Notes (Signed)
Dr. James at bedside  

## 2015-07-31 NOTE — ED Notes (Signed)
Pt started on 13.89mL/hr, with a 4000 mL bolus per Dr. Jeneen Rinks. EMS arrived to transport pt to [facility].

## 2015-07-31 NOTE — ED Notes (Signed)
[facility] departed [facility] en route to [facility] Cath Lab.

## 2015-07-31 NOTE — H&P (Signed)
Patient ID: MEA CROCKER MRN: [id number] DOB/AGE: 07/19/1955 60 y.o. Admit date: 07/31/2015  Primary Care Physician: Primary Cardiologist:   HPI: 60 y.o. female with CAD, DM, HTN, obesity who presented to [facility] today with c/o chest pain and left arm pain. EKG with ST elevation in the inferior leads c/w acute STEMI. She has a rich history of coronary disease. In 2001, when she was only 60 years old she received a bare-metal stent to the mid LAD artery. In 2008 she presented with an acute myocardial infarction and received a 3 x 24 mm drug-eluting Taxus stent to the LAD artery. Subsequently underwent staged revascularization with a drug-eluting Cypher 2.5 x 13 mm to the distal LAD artery and a 3.0 x 23 mm drug-eluting Cypher stent to the right coronary artery. She has not required repeat coronary angiography since that time. Her most recent functional study in July of 2012 was negative for ischemia, showed normal left ventricular systolic function with an ejection fraction of 67%. She has never had symptoms of congestive heart failure.   Code STEMI activated at [facility]. Emergent transport to [facility] for emergent cardiac cath. 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   Ongoing left arm pain and chest pressure when she arrived at [facility].   Review of systems complete and found to be negative unless listed above   Past Medical History:  Diagnosis Date  . CAD (coronary artery disease)   . Cardiac arrhythmia   . DES exposure in utero   . Diabetes mellitus   . Dyslipidemia   . Hypertension   . MI (myocardial infarction) ([place])   . Thyroid disease     Family History  Problem Relation Age of Onset  . Hypertension Mother   . Thyroid disease Mother   . Esophageal cancer Maternal Aunt   . Prostate cancer Maternal Uncle     Social History   Social History  . Marital status: Married    Spouse name: N/A  . Number of children: N/A  . Years of education: N/A    Occupational History  . EKG [employer]   Social History Main Topics  . Smoking status: Former Research scientist (life sciences)  . Smokeless tobacco: Former Systems developer    Quit date: 01/05/2005  . Alcohol use No  . Drug use: No  . Sexual activity: Yes    Partners: Male   Other Topics Concern  . Not on file   Social History Narrative  . No narrative on file    Past Surgical History:  Procedure Laterality Date  . BACK SURGERY  5/99 & 6/99  . CARDIAC CATHETERIZATION  01/12/2002   stent patent  . CARDIAC CATHETERIZATION  09/03/2006   patent stents  . CORONARY ANGIOPLASTY WITH STENT PLACEMENT  07/17/1998   stent LAD  . CORONARY ANGIOPLASTY WITH STENT PLACEMENT  01/07/2006   stent RCA,LAD stent patent  . CORONARY ANGIOPLASTY WITH STENT PLACEMENT  03/18/2006   stent to mid RCA,prox.RCA and LAD stent patent  . CRYOTHERAPY    . THYROID SURGERY     PT. DENIES/RADIATION  . TUBAL LIGATION      Allergies  Allergen Reactions  . Levothyroxine     Patient thinks it was the dye in the tablets she was allergic to  . Lopressor [Metoprolol Tartrate] Cough    Fatigue   . Metformin And Related Diarrhea  . Norvasc [Amlodipine Besylate]     Prior to Admission Meds:  Prior to  Admission medications   Medication Sig Start Date End Date Taking? Authorizing Provider  albuterol (PROAIR HFA) 108 (90 BASE) MCG/ACT inhaler Inhale 2 puffs into the lungs every 6 (six) hours as needed. For coughing and/or wheezing   Yes Historical Provider, MD  aspirin EC 81 MG tablet Take 162 mg by mouth once.   Yes Historical Provider, MD  canagliflozin (INVOKANA) 100 MG TABS tablet Take 100 mg by mouth daily before breakfast.   Yes Historical Provider, MD  thyroid (ARMOUR) 60 MG tablet Take 60 mg by mouth every morning. Take one 60mg  tablet Friday-Sunday. Take one 60 mg tab with one 15mg  tab (75mg ) Monday through Thursday).   Yes Historical Provider, MD  CRESTOR 20 MG tablet TAKE 1 TABLET BY MOUTH DAILY. 06/25/14   Isaiah Serge, NP  ezetimibe  (ZETIA) 10 MG tablet TAKE 1 TABLET (10 MG TOTAL) BY MOUTH DAILY. 03/25/15   Mihai Croitoru, MD  fexofenadine (ALLEGRA) 180 MG tablet Take 180 mg by mouth daily.     Historical Provider, MD  TAZTIA XT 180 MG 24 hr capsule TAKE 2 CAPSULES BY MOUTH DAILY. 03/25/15   Mihai Croitoru, MD  thyroid (ARMOUR) 15 MG tablet Take 15 mg by mouth every morning. Take one tablet with one 60mg  tablet for every morning before breakfast for a total 75mg  days Monday-Thursday    Historical Provider, MD  valsartan-hydrochlorothiazide (DIOVAN-HCT) 160-12.5 MG per tablet TAKE 1 TABLET BY MOUTH DAILY 06/25/14   Sanda Klein, MD    Physical Exam: Blood pressure 131/96, pulse 92, resp. rate 21, height 5\' 5"  (1.651 m), weight 198 lb (89.8 kg), SpO2 98 %.    General: Well developed, well nourished, NAD  HEENT: OP clear, mucus membranes moist  SKIN: warm, dry. No rashes.  Neuro: No focal deficits  Musculoskeletal: Muscle strength 5/5 all ext  Psychiatric: Mood and affect normal  Neck: No JVD, no carotid bruits, no thyromegaly, no lymphadenopathy.  Lungs:Clear bilaterally, no wheezes, rhonci, crackles  Cardiovascular: Regular rate and rhythm. No murmurs, gallops or rubs.  Abdomen:Soft. Bowel sounds present. Non-tender.  Extremities: No lower extremity edema. Pulses are 2 + in the bilateral DP/PT.   Labs:   Lab Results  Component Value Date   WBC 9.8 07/31/2015   HGB 16.8 (H) 07/31/2015   HCT 49.0 (H) 07/31/2015   MCV 85.4 07/31/2015   PLT 296 07/31/2015       EKG: Sinus, rate 82 bpm. 29mm ST elevation inferior leads with ST depression lateral leads.   ASSESSMENT AND PLAN:   1. Acute inferior STEMI: Emergent cardiac cath with PCI. Further plans to follow.   Darlina Guys, MD 07/31/2015, 6:13 PM

## 2015-07-31 NOTE — ED Notes (Signed)
Called [facility] for [facility] then called [facility] for transport.

## 2015-08-01 ENCOUNTER — Encounter (HOSPITAL_COMMUNITY): Payer: Self-pay | Admitting: Cardiovascular Disease

## 2015-08-01 ENCOUNTER — Inpatient Hospital Stay (HOSPITAL_COMMUNITY): Payer: 59

## 2015-08-01 DIAGNOSIS — I2119 ST elevation (STEMI) myocardial infarction involving other coronary artery of inferior wall: Secondary | ICD-10-CM

## 2015-08-01 LAB — ECHOCARDIOGRAM COMPLETE
HEIGHTINCHES: 65 in
Weight: 3156.99 oz

## 2015-08-01 LAB — GLUCOSE, CAPILLARY
GLUCOSE-CAPILLARY: 116 mg/dL — AB (ref 65–99)
GLUCOSE-CAPILLARY: 155 mg/dL — AB (ref 65–99)
Glucose-Capillary: 107 mg/dL — ABNORMAL HIGH (ref 65–99)
Glucose-Capillary: 116 mg/dL — ABNORMAL HIGH (ref 65–99)

## 2015-08-01 LAB — BASIC METABOLIC PANEL
Anion gap: 9 (ref 5–15)
BUN: 13 mg/dL (ref 6–20)
CHLORIDE: 104 mmol/L (ref 101–111)
CO2: 24 mmol/L (ref 22–32)
CREATININE: 0.95 mg/dL (ref 0.44–1.00)
Calcium: 8.9 mg/dL (ref 8.9–10.3)
GFR calc Af Amer: >60 mL/min (ref >60)
GFR calc non Af Amer: >60 mL/min (ref >60)
Glucose, Bld: 105 mg/dL — ABNORMAL HIGH (ref 65–99)
Potassium: 3.5 mmol/L (ref 3.5–5.1)
Sodium: 137 mmol/L (ref 135–145)

## 2015-08-01 LAB — CBC
HCT: 46.1 % — ABNORMAL HIGH (ref 36.0–46.0)
Hemoglobin: 15.7 g/dL — ABNORMAL HIGH (ref 12.0–15.0)
MCH: 28.9 pg (ref 26.0–34.0)
MCHC: 34.1 g/dL (ref 30.0–36.0)
MCV: 84.7 fL (ref 78.0–100.0)
PLATELETS: 275 10*3/uL (ref 150–400)
RBC: 5.44 MIL/uL — ABNORMAL HIGH (ref 3.87–5.11)
RDW: 14.1 % (ref 11.5–15.5)
WBC: 7.5 10*3/uL (ref 4.0–10.5)

## 2015-08-01 LAB — TROPONIN I
Troponin I: 1.28 ng/mL (ref <0.03)
Troponin I: 2.73 ng/mL (ref <0.03)

## 2015-08-01 LAB — POCT ACTIVATED CLOTTING TIME: Activated Clotting Time: 731 seconds

## 2015-08-01 MED ORDER — SODIUM CHLORIDE 0.9% FLUSH: 3.0000 mL | INTRAVENOUS | Status: DC | PRN

## 2015-08-01 MED ORDER — SODIUM CHLORIDE 0.9 % IV SOLN: 250.0000 mL | INTRAVENOUS | Status: DC | PRN

## 2015-08-01 MED ORDER — ASPIRIN 81 MG PO CHEW
81.0000 mg | CHEWABLE_TABLET | ORAL | Status: AC
  Administered 2015-08-02: 81 mg via ORAL
  Filled 2015-08-01: qty 1

## 2015-08-01 MED ORDER — PERFLUTREN LIPID MICROSPHERE
1.0000 mL | INTRAVENOUS | Status: AC | PRN
  Administered 2015-08-01: 2 mL via INTRAVENOUS
  Filled 2015-08-01: qty 10

## 2015-08-01 MED ORDER — LORATADINE 10 MG PO TABS
10.0000 mg | ORAL_TABLET | Freq: Every day | ORAL | Status: DC
  Administered 2015-08-01 – 2015-08-02 (×2): 10 mg via ORAL
  Filled 2015-08-01 (×3): qty 1

## 2015-08-01 MED ORDER — PERFLUTREN LIPID MICROSPHERE
INTRAVENOUS | Status: AC
  Filled 2015-08-01: qty 10

## 2015-08-01 MED ORDER — SODIUM CHLORIDE 0.9% FLUSH
3.0000 mL | Freq: Two times a day (BID) | INTRAVENOUS | Status: DC
  Administered 2015-08-01: 3 mL via INTRAVENOUS

## 2015-08-01 MED ORDER — SODIUM CHLORIDE 0.9 % WEIGHT BASED INFUSION
3.0000 mL/kg/h | INTRAVENOUS | Status: DC
  Administered 2015-08-02: 3 mL/kg/h via INTRAVENOUS

## 2015-08-01 MED ORDER — SODIUM CHLORIDE 0.9 % WEIGHT BASED INFUSION
1.0000 mL/kg/h | INTRAVENOUS | Status: DC
Start: 2015-08-02 — End: 2015-08-02

## 2015-08-01 MED ORDER — SODIUM CHLORIDE 0.9 % IV SOLN
INTRAVENOUS | Status: DC
  Administered 2015-08-01: 16:00:00 via INTRAVENOUS

## 2015-08-01 NOTE — Progress Notes (Addendum)
  Echocardiogram 2D Echocardiogram with Definity has been performed.  Diamond Nickel 08/01/2015, 10:17 AM

## 2015-08-01 NOTE — Consult Note (Addendum)
   [name] CM Inpatient Consult   08/01/2015  Shelley [name] 10/25/1955 [id number]   Came to visit Shelley [name] on behalf of [facility] to [employer] for [employer] with [employer]. She is followed by Norm [employer] to [employer] for DM management. Discussed that she will also receive post hospital discharge call from [facility] to [place]. Provided [employer] telephone number and will provide HR/benefit number for additional questions. Will update Link to [employer] as well.  Left contact information at bedside. Appreciative of visit.  Marthenia Rolling, MSN-Ed, RN,BSN [facility] Liaison [phone]

## 2015-08-01 NOTE — Progress Notes (Signed)
CARDIAC REHAB PHASE I   Pt ambulated with RN 350 ft without problems, feeling well. Ed completed with good reception. Pt has been trying to take care of herself. She tries to increase her steps and watch her diet. 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 Discussed this and also good communication with her MD regarding meds. She is interested in [place] and will send referral to [place]. She understands the importance of Brilinta however has not received the card yet. Encouraged her to walk again later. [id number]  [place], ACSM 08/01/2015 12:04 PM

## 2015-08-01 NOTE — Progress Notes (Signed)
   Reviewed coronary angiography.  Discussed with Dr. Angelena Form.  Distal RCA lesion in 70-80% range and both Dr. Angelena Form and I would be in favor or stenting. The patient was counseled to undergo left heart catheterization, coronary angiography, and possible percutaneous coronary intervention with stent implantation. The procedural risks and benefits were discussed in detail. The risks discussed included death, stroke, myocardial infarction, life-threatening bleeding, limb ischemia, kidney injury, allergy, and possible emergency cardiac surgery. The risk of these significant complications were estimated to occur less than 1% of the time. After discussion, the patient has agreed to proceed.

## 2015-08-01 NOTE — Progress Notes (Signed)
       Patient Name: Shelley [name] Date of Encounter: 08/01/2015    SUBJECTIVE: Asymptomatic this morning. Blood pressures are still relatively soft. Coronary images were personally reviewed. Greater than 75% distal stenosis before the PDA is noted.  TELEMETRY:  Normal sinus rhythm Vitals:   08/01/15 0400 08/01/15 0600 08/01/15 0700 08/01/15 0752  BP: (!) 91/49 109/88 104/63   Pulse: (!) 56 (!) 56 62 62  Resp: 19 13 15 17   Temp: 97.8 F (36.6 C)   97.4 F (36.3 C)  TempSrc: Oral   Oral  SpO2: 95% 97% 97% 99%  Weight:      Height:        Intake/Output Summary (Last 24 hours) at 08/01/15 1100 Last data filed at 08/01/15 0500  Gross per 24 hour  Intake           580.64 ml  Output             1575 ml  Net          -994.36 ml   LABS: Basic Metabolic Panel:  Recent Labs  07/31/15 1715 08/01/15 0204  NA 135 137  K 3.2* 3.5  CL 103 104  CO2 26 24  GLUCOSE 127* 105*  BUN 16 13  CREATININE 1.09* 0.95  CALCIUM 8.9 8.9   CBC:  Recent Labs  07/31/15 1715 08/01/15 0204  WBC 9.8 7.5  HGB 16.8* 15.7*  HCT 49.0* 46.1*  MCV 85.4 84.7  PLT 296 275   Cardiac Enzymes:  Recent Labs  07/31/15 1937 08/01/15 0204 08/01/15 0727  TROPONINI 0.05* 1.28* 2.73*   Radiology/Studies:  Admitting chest x-ray did not reveal evidence of congestion  Physical Exam: Blood pressure 104/63, pulse 62, temperature 97.4 F (36.3 C), temperature source Oral, resp. rate 17, height 5\' 5"  (1.651 m), weight 197 lb 5 oz (89.5 kg), SpO2 99 %. Weight change:   Wt Readings from Last 3 Encounters:  07/31/15 197 lb 5 oz (89.5 kg)  06/24/15 198 lb (89.8 kg)  03/25/15 200 lb (90.7 kg)   No JVD although difficult to assess because of neck morphology. Chest is clear S4 gallop is heard on auscultation without murmur or rub. No peripheral edema. Right radial cath site is unremarkable.  ASSESSMENT:  1. Acute inferior infarction due to stent thrombosis within the right coronary. Prior  stent in right coronary was first generation. Prior to admission she was on intermittent aspirin therapy and not the DAPT. No bleeding or clinical issues since intervention last evening. Residual distal RCA disease. PDA was collateralized from the left coronary. 2. Normal left ventricular systolic function with estimated ejection fraction of 60% and normal filling pressures. 3. Hyperlipidemia  Plan:  1. Indefinite dual antiplatelet therapy. 2. Aggressive risk factor modification 3. Phase I cardiac rehabilitation 4. Ambulate as tolerated. Will discuss with Dr. Angelena Form whether restudy with distal RCA stent or simply treat with medical therapy.  Signed, Belva Crome III 08/01/2015, 11:00 AM

## 2015-08-02 ENCOUNTER — Encounter (HOSPITAL_COMMUNITY)
Admission: EM | Disposition: A | Discharge status: Home / Self Care | Payer: Self-pay | Source: Home / Self Care | Attending: Cardiovascular Disease

## 2015-08-02 ENCOUNTER — Encounter (HOSPITAL_COMMUNITY): Payer: Self-pay | Admitting: Cardiology

## 2015-08-02 DIAGNOSIS — I25118 Atherosclerotic heart disease of native coronary artery with other forms of angina pectoris: Secondary | ICD-10-CM

## 2015-08-02 DIAGNOSIS — Z955 Presence of coronary angioplasty implant and graft: Secondary | ICD-10-CM

## 2015-08-02 DIAGNOSIS — E785 Hyperlipidemia, unspecified: Secondary | ICD-10-CM

## 2015-08-02 DIAGNOSIS — I10 Essential (primary) hypertension: Secondary | ICD-10-CM

## 2015-08-02 DIAGNOSIS — I2510 Atherosclerotic heart disease of native coronary artery without angina pectoris: Secondary | ICD-10-CM

## 2015-08-02 HISTORY — PX: CARDIAC CATHETERIZATION: SHX172

## 2015-08-02 LAB — GLUCOSE, CAPILLARY
GLUCOSE-CAPILLARY: 121 mg/dL — AB (ref 65–99)
GLUCOSE-CAPILLARY: 92 mg/dL (ref 65–99)
GLUCOSE-CAPILLARY: 158 mg/dL — AB (ref 65–99)
Glucose-Capillary: 81 mg/dL (ref 65–99)

## 2015-08-02 LAB — POCT ACTIVATED CLOTTING TIME: Activated Clotting Time: 543 seconds

## 2015-08-02 LAB — BASIC METABOLIC PANEL
Anion gap: 11 (ref 5–15)
BUN: 13 mg/dL (ref 6–20)
CALCIUM: 9 mg/dL (ref 8.9–10.3)
CO2: 24 mmol/L (ref 22–32)
CREATININE: 0.98 mg/dL (ref 0.44–1.00)
Chloride: 105 mmol/L (ref 101–111)
GFR calc Af Amer: >60 mL/min (ref >60)
GLUCOSE: 105 mg/dL — AB (ref 65–99)
Potassium: 3.9 mmol/L (ref 3.5–5.1)
SODIUM: 140 mmol/L (ref 135–145)

## 2015-08-02 SURGERY — CORONARY STENT INTERVENTION
Anesthesia: LOCAL

## 2015-08-02 MED ORDER — FENTANYL CITRATE (PF) 100 MCG/2ML IJ SOLN
INTRAMUSCULAR | Status: AC
  Filled 2015-08-02: qty 2

## 2015-08-02 MED ORDER — SODIUM CHLORIDE 0.9% FLUSH
3.0000 mL | Freq: Two times a day (BID) | INTRAVENOUS | Status: DC
  Administered 2015-08-02 (×2): 3 mL via INTRAVENOUS

## 2015-08-02 MED ORDER — HYDROCHLOROTHIAZIDE 12.5 MG PO CAPS
12.5000 mg | ORAL_CAPSULE | Freq: Every day | ORAL | Status: DC
  Administered 2015-08-02 – 2015-08-03 (×2): 12.5 mg via ORAL
  Filled 2015-08-02 (×2): qty 1

## 2015-08-02 MED ORDER — ANGIOPLASTY BOOK
Freq: Once | Status: DC
  Filled 2015-08-02: qty 1

## 2015-08-02 MED ORDER — IOPAMIDOL (ISOVUE-370) INJECTION 76%
INTRAVENOUS | Status: DC | PRN
  Administered 2015-08-02: 75 mL via INTRA_ARTERIAL

## 2015-08-02 MED ORDER — FENTANYL CITRATE (PF) 100 MCG/2ML IJ SOLN
INTRAMUSCULAR | Status: DC | PRN
  Administered 2015-08-02: 25 ug via INTRAVENOUS
  Administered 2015-08-02: 50 ug via INTRAVENOUS

## 2015-08-02 MED ORDER — IOPAMIDOL (ISOVUE-370) INJECTION 76%
INTRAVENOUS | Status: AC
  Filled 2015-08-02: qty 100

## 2015-08-02 MED ORDER — VERAPAMIL HCL 2.5 MG/ML IV SOLN
INTRAVENOUS | Status: DC | PRN
  Administered 2015-08-02: 09:00:00 via INTRA_ARTERIAL

## 2015-08-02 MED ORDER — MIDAZOLAM HCL 2 MG/2ML IJ SOLN
INTRAMUSCULAR | Status: AC
  Filled 2015-08-02: qty 2

## 2015-08-02 MED ORDER — NITROGLYCERIN 1 MG/10 ML FOR IR/CATH LAB
INTRA_ARTERIAL | Status: AC
  Filled 2015-08-02: qty 10

## 2015-08-02 MED ORDER — SODIUM CHLORIDE 0.9 % IV SOLN: 250.0000 mL | INTRAVENOUS | Status: DC | PRN

## 2015-08-02 MED ORDER — LIDOCAINE HCL (PF) 1 % IJ SOLN
INTRAMUSCULAR | Status: DC | PRN
  Administered 2015-08-02: 5 mL

## 2015-08-02 MED ORDER — CARVEDILOL 3.125 MG PO TABS
3.1250 mg | ORAL_TABLET | Freq: Two times a day (BID) | ORAL | Status: DC
  Administered 2015-08-02: 3.125 mg via ORAL
  Filled 2015-08-02: qty 1

## 2015-08-02 MED ORDER — HEPARIN SODIUM (PORCINE) 1000 UNIT/ML IJ SOLN
INTRAMUSCULAR | Status: DC | PRN
  Administered 2015-08-02: 7000 [IU] via INTRAVENOUS

## 2015-08-02 MED ORDER — SODIUM CHLORIDE 0.9% FLUSH: 3.0000 mL | INTRAVENOUS | Status: DC | PRN

## 2015-08-02 MED ORDER — HEPARIN SODIUM (PORCINE) 1000 UNIT/ML IJ SOLN
INTRAMUSCULAR | Status: AC
  Filled 2015-08-02: qty 1

## 2015-08-02 MED ORDER — MIDAZOLAM HCL 2 MG/2ML IJ SOLN
INTRAMUSCULAR | Status: DC | PRN
  Administered 2015-08-02: 2 mg via INTRAVENOUS
  Administered 2015-08-02: 1 mg via INTRAVENOUS

## 2015-08-02 MED ORDER — HEPARIN (PORCINE) IN NACL 2-0.9 UNIT/ML-% IJ SOLN
INTRAMUSCULAR | Status: DC | PRN
  Administered 2015-08-02: 1000 mL

## 2015-08-02 MED ORDER — ACETAMINOPHEN 325 MG PO TABS: 650.0000 mg | ORAL_TABLET | ORAL | Status: DC | PRN

## 2015-08-02 MED ORDER — LIDOCAINE HCL (PF) 1 % IJ SOLN
INTRAMUSCULAR | Status: AC
  Filled 2015-08-02: qty 30

## 2015-08-02 MED ORDER — VERAPAMIL HCL 2.5 MG/ML IV SOLN
INTRAVENOUS | Status: AC
  Filled 2015-08-02: qty 2

## 2015-08-02 MED ORDER — HEPARIN (PORCINE) IN NACL 2-0.9 UNIT/ML-% IJ SOLN
INTRAMUSCULAR | Status: AC
  Filled 2015-08-02: qty 1000

## 2015-08-02 MED ORDER — SODIUM CHLORIDE 0.9 % IV SOLN
INTRAVENOUS | Status: DC
  Administered 2015-08-02: 11:00:00 via INTRAVENOUS

## 2015-08-02 MED ORDER — IRBESARTAN 75 MG PO TABS
150.0000 mg | ORAL_TABLET | Freq: Every day | ORAL | Status: DC
Start: 2015-08-02 — End: 2015-08-03
  Administered 2015-08-02 – 2015-08-03 (×2): 150 mg via ORAL
  Filled 2015-08-02 (×2): qty 2

## 2015-08-02 MED ORDER — NITROGLYCERIN 1 MG/10 ML FOR IR/CATH LAB
INTRA_ARTERIAL | Status: DC | PRN
  Administered 2015-08-02: 200 ug via INTRACORONARY

## 2015-08-02 SURGICAL SUPPLY — 13 items
BALLN EMERGE MR 2.5X12 (BALLOONS) ×2
BALLOON EMERGE MR 2.5X12 (BALLOONS) IMPLANT
CATH VISTA GUIDE 6FR JR4 (CATHETERS) ×1 IMPLANT
DEVICE RAD COMP TR BAND LRG (VASCULAR PRODUCTS) ×1 IMPLANT
GLIDESHEATH SLEND A-KIT 6F 22G (SHEATH) ×1 IMPLANT
KIT ENCORE 26 ADVANTAGE (KITS) ×3 IMPLANT
KIT HEART LEFT (KITS) ×2 IMPLANT
PACK CARDIAC CATHETERIZATION (CUSTOM PROCEDURE TRAY) ×2 IMPLANT
STENT PROMUS PREM MR 3.0X16 (Permanent Stent) ×1 IMPLANT
TRANSDUCER W/STOPCOCK (MISCELLANEOUS) ×2 IMPLANT
TUBING CIL FLEX 10 FLL-RA (TUBING) ×2 IMPLANT
WIRE HI TORQ BMW 190CM (WIRE) ×1 IMPLANT
WIRE SAFE-T 1.5MM-J .035X260CM (WIRE) ×1 IMPLANT

## 2015-08-02 NOTE — Progress Notes (Signed)
Right TR Band removed and clean dry drsg applied. Site is level 0. Palpable 2t radial and ulnar. Pt denies numbness or tingling. Pt tolerated well.Pt is aware of continued mobility restrictions.

## 2015-08-02 NOTE — Progress Notes (Signed)
Benefit check BRILINTA 90 MG BID ( 30 ) 60 TAB   COVER- YES  CO-PAY- $ 25.00  PRIOR APPROVAL- NO  PHARMACY : [facility]

## 2015-08-02 NOTE — H&P (View-Only) (Signed)
Hospital Problem List     Principal Problem:   ST elevation myocardial infarction involving right coronary artery (HCC) Active Problems:   HTN (hypertension)   Hyperlipidemia   ST elevation myocardial infarction (STEMI) of inferior wall [facility])     Patient Profile:   Primary Cardiologist: Dr. Sallyanne Kuster  60 yo female w/ PMH of CAD (s/p BMS to LAD in 2001, DES to LAD in 2008, staged DES to distal LAD and RCA in 2008)Type 2 DM, HTN, and HLD who presented to [facility] on 7/26 for evaluation of chest pain. CODE STEMI acitvated and she was transferred to [facility] for emergent cardiac catheterization.  Subjective   Denies any chest discomfort overnight. Vitals stable. For stent intervention of the distal RCA this morning.  Inpatient Medications    . [MAR Hold] aspirin  81 mg Oral Daily  . [MAR Hold] canagliflozin  100 mg Oral QAC breakfast  . [MAR Hold] ezetimibe  10 mg Oral Daily  . [MAR Hold] insulin aspart  0-15 Units Subcutaneous TID WC  . [MAR Hold] loratadine  10 mg Oral Daily  . [MAR Hold] rosuvastatin  20 mg Oral Daily  . [MAR Hold] sodium chloride flush  3 mL Intravenous Q12H  . sodium chloride flush  3 mL Intravenous Q12H  . [MAR Hold] thyroid  60 mg Oral Once per day on Sun Fri Sat  . [MAR Hold] thyroid  75 mg Oral Once per day on Mon Tue Wed Thu  . [MAR Hold] ticagrelor  90 mg Oral BID    Vital Signs    Vitals:   08/01/15 1546 08/01/15 2035 08/02/15 0407 08/02/15 0858  BP: 127/84 (!) 151/83 115/72   Pulse: 91 96 82   Resp:  20 20   Temp: 98.2 F (36.8 C) 98.5 F (36.9 C) 98.3 F (36.8 C)   TempSrc: Oral Oral Oral   SpO2: 96% 98% 95% 98%  Weight:   197 lb (89.4 kg)   Height:        Intake/Output Summary (Last 24 hours) at 08/02/15 0903 Last data filed at 08/01/15 1900  Gross per 24 hour  Intake           475.42 ml  Output                0 ml  Net           475.42 ml   Filed Weights   07/31/15 1744 07/31/15 1930 08/02/15 0407  Weight:  198 lb (89.8 kg) 197 lb 5 oz (89.5 kg) 197 lb (89.4 kg)    Physical Exam    General: Well developed, well nourished, female appearing in no acute distress. Head: Normocephalic, atraumatic.  Neck: Supple without bruits, JVD not elevated. Lungs:  Resp regular and unlabored, CTA without wheezing or rales. Heart: RRR, S1, S2, no S3, S4, or murmur; no rub. Abdomen: Soft, non-tender, non-distended with normoactive bowel sounds. No hepatomegaly. No rebound/guarding. No obvious abdominal masses. Extremities: No clubbing, cyanosis, or edema. Distal pedal pulses are 2+ bilaterally. Neuro: Alert and oriented X 3. Moves all extremities spontaneously. Psych: Normal affect.  Labs    CBC  Recent Labs  07/31/15 1715 08/01/15 0204  WBC 9.8 7.5  HGB 16.8* 15.7*  HCT 49.0* 46.1*  MCV 85.4 84.7  PLT 296 123XX123   Basic Metabolic Panel  Recent Labs  08/01/15 0204 08/02/15 0401  NA 137 140  K 3.5 3.9  CL 104 105  CO2 24  24  GLUCOSE 105* 105*  BUN 13 13  CREATININE 0.95 0.98  CALCIUM 8.9 9.0   Liver Function Tests No results for input(s): AST, ALT, ALKPHOS, BILITOT, PROT, ALBUMIN in the last 72 hours. No results for input(s): LIPASE, AMYLASE in the last 72 hours. Cardiac Enzymes  Recent Labs  07/31/15 1937 08/01/15 0204 08/01/15 0727  TROPONINI 0.05* 1.28* 2.73*   BNP Invalid input(s): POCBNP D-Dimer No results for input(s): DDIMER in the last 72 hours. Hemoglobin A1C No results for input(s): HGBA1C in the last 72 hours. Fasting Lipid Panel No results for input(s): CHOL, HDL, LDLCALC, TRIG, CHOLHDL, LDLDIRECT in the last 72 hours. Thyroid Function Tests No results for input(s): TSH, T4TOTAL, T3FREE, THYROIDAB in the last 72 hours.  Invalid input(s): FREET3  Telemetry    NSR, HR in 70's - 80's. Tachycardiac at times into the 110's.  ECG    No new tracings.   Cardiac Studies and Radiology    Dg Chest Portable 1 View: Result Date: 07/31/2015 CLINICAL DATA:  Chest pain  today.  Initial encounter. EXAM: PORTABLE CHEST 1 VIEW COMPARISON:  PA and lateral chest 04/27/2011. FINDINGS: The lungs are clear. Heart size is normal. No pneumothorax or pleural effusion. The study is limited by portable technique and patient size. IMPRESSION: No acute disease. Electronically Signed   By: Inge Rise M.D.   On: 07/31/2015 17:50  Cardiac Catheterization: 07/31/2015  Prox RCA to Mid RCA lesion, 10 %stenosed.  Prox LAD to Mid LAD lesion, 10 %stenosed.  A drug eluting stent was successfully placed, and overlaps previously placed stent.  Prox RCA lesion, 100 %stenosed.  Post intervention, there is a 0% residual stenosis.  The left ventricular ejection fraction is 55-65% by visual estimate.  The left ventricular systolic function is normal.  LV end diastolic pressure is normal.  Dist RCA lesion, 70 %stenosed.   1. Acute inferior STEMI secondary to occluded proximal RCA 2. Successful PTCA/DES x 1 proximal RCA 3. There is a 70% stenosis in the distal RCA 4. Patent stents in the LAD with minimal restenosis.  5. Preserved LV systolic function with subtle hypokinesis of the inferior wall  Recommendations: Will admit to ICU. Echo in am. Will continue ASA/Brilinta/statin. She is intolerant of beta blockers. Will hold anti-hypertensives tonight. Restart in am if BP stable. She may need need staged PCI of the distal RCA. Will review with interventional team in the am.   Assessment & Plan    1. STEMI due to stent thrombosis within the proximal RCA - history of CAD with BMS to LAD in 2001, DES to LAD in 2008, staged DES to distal LAD and RCA in 2008. Presented to [facility] for evaluation of chest pain and CODE STEMI was activated. - cardiac catheterization showed 100% stenosis of the proximal RCA and this was treated with a DES. There was 70% stenosis along the distal RCA. This was reviewed by both Dr. Tamala Julian and Dr. Angelena Form and she is for coronary stent intervention of  this lesion later today. - started on DAPT with ASA and Brilinta. Will need to be on DAPT indefinitely. - continue Zetia and statin. BP has borderline hypotensive, therefore unable to start BB at this time. Continue statin therapy and Zetia.  2. HTN - BP has been 92/59 - 151/84 in the past 24 hours. - PTA Diovan held. Would prefer initiation of BB therapy prior to discharge.   3. Hyperlipidemia - check Lipid Panel in AM. - continue Crestor 20mg  daily  and Zetia.If LDL not at goal, increase Crestor to 40mg  daily.   Signed, Erma Heritage , PA-C 9:03 AM 08/02/2015 Pager: [phone]

## 2015-08-02 NOTE — Care Management Note (Addendum)
Case Management Note  Patient Details  Name: Shelley [name] MRN: [id number] Date of Birth: 08/18/1955  Subjective/Objective:  Patient is from home , s/p intervention, copay is 25.00 , [facility] .  NCM will cont to follow for dc needs.                  Action/Plan:   Expected Discharge Date:                  Expected Discharge Plan:  Home/Self Care  In-House Referral:     Discharge planning Services  CM Consult  Post Acute Care Choice:    Choice offered to:     DME Arranged:    DME Agency:     HH Arranged:    HH Agency:     Status of Service:  Completed, signed off  If discussed at [employer] of Stay Meetings, dates discussed:    Additional Comments:  Zenon Mayo, RN 08/02/2015, 12:21 PM

## 2015-08-02 NOTE — Progress Notes (Addendum)
Hospital Problem List     Principal Problem:   ST elevation myocardial infarction involving right coronary artery (HCC) Active Problems:   HTN (hypertension)   Hyperlipidemia   ST elevation myocardial infarction (STEMI) of inferior wall [facility])     Patient Profile:   Primary Cardiologist: Dr. Sallyanne Kuster  60 yo female w/ PMH of CAD (s/p BMS to LAD in 2001, DES to LAD in 2008, staged DES to distal LAD and RCA in 2008)Type 2 DM, HTN, and HLD who presented to [facility] on 7/26 for evaluation of chest pain. CODE STEMI acitvated and she was transferred to [facility] for emergent cardiac catheterization.  Subjective   Denies any chest discomfort overnight. Vitals stable. For stent intervention of the distal RCA this morning.  Inpatient Medications    . [MAR Hold] aspirin  81 mg Oral Daily  . [MAR Hold] canagliflozin  100 mg Oral QAC breakfast  . [MAR Hold] ezetimibe  10 mg Oral Daily  . [MAR Hold] insulin aspart  0-15 Units Subcutaneous TID WC  . [MAR Hold] loratadine  10 mg Oral Daily  . [MAR Hold] rosuvastatin  20 mg Oral Daily  . [MAR Hold] sodium chloride flush  3 mL Intravenous Q12H  . sodium chloride flush  3 mL Intravenous Q12H  . [MAR Hold] thyroid  60 mg Oral Once per day on Sun Fri Sat  . [MAR Hold] thyroid  75 mg Oral Once per day on Mon Tue Wed Thu  . [MAR Hold] ticagrelor  90 mg Oral BID    Vital Signs    Vitals:   08/01/15 1546 08/01/15 2035 08/02/15 0407 08/02/15 0858  BP: 127/84 (!) 151/83 115/72   Pulse: 91 96 82   Resp:  20 20   Temp: 98.2 F (36.8 C) 98.5 F (36.9 C) 98.3 F (36.8 C)   TempSrc: Oral Oral Oral   SpO2: 96% 98% 95% 98%  Weight:   197 lb (89.4 kg)   Height:        Intake/Output Summary (Last 24 hours) at 08/02/15 0903 Last data filed at 08/01/15 1900  Gross per 24 hour  Intake           475.42 ml  Output                0 ml  Net           475.42 ml   Filed Weights   07/31/15 1744 07/31/15 1930 08/02/15 0407  Weight:  198 lb (89.8 kg) 197 lb 5 oz (89.5 kg) 197 lb (89.4 kg)    Physical Exam    General: Well developed, well nourished, female appearing in no acute distress. Head: Normocephalic, atraumatic.  Neck: Supple without bruits, JVD not elevated. Lungs:  Resp regular and unlabored, CTA without wheezing or rales. Heart: RRR, S1, S2, no S3, S4, or murmur; no rub. Abdomen: Soft, non-tender, non-distended with normoactive bowel sounds. No hepatomegaly. No rebound/guarding. No obvious abdominal masses. Extremities: No clubbing, cyanosis, or edema. Distal pedal pulses are 2+ bilaterally. Neuro: Alert and oriented X 3. Moves all extremities spontaneously. Psych: Normal affect.  Labs    CBC  Recent Labs  07/31/15 1715 08/01/15 0204  WBC 9.8 7.5  HGB 16.8* 15.7*  HCT 49.0* 46.1*  MCV 85.4 84.7  PLT 296 123XX123   Basic Metabolic Panel  Recent Labs  08/01/15 0204 08/02/15 0401  NA 137 140  K 3.5 3.9  CL 104 105  CO2 24  24  GLUCOSE 105* 105*  BUN 13 13  CREATININE 0.95 0.98  CALCIUM 8.9 9.0   Liver Function Tests No results for input(s): AST, ALT, ALKPHOS, BILITOT, PROT, ALBUMIN in the last 72 hours. No results for input(s): LIPASE, AMYLASE in the last 72 hours. Cardiac Enzymes  Recent Labs  07/31/15 1937 08/01/15 0204 08/01/15 0727  TROPONINI 0.05* 1.28* 2.73*   BNP Invalid input(s): POCBNP D-Dimer No results for input(s): DDIMER in the last 72 hours. Hemoglobin A1C No results for input(s): HGBA1C in the last 72 hours. Fasting Lipid Panel No results for input(s): CHOL, HDL, LDLCALC, TRIG, CHOLHDL, LDLDIRECT in the last 72 hours. Thyroid Function Tests No results for input(s): TSH, T4TOTAL, T3FREE, THYROIDAB in the last 72 hours.  Invalid input(s): FREET3  Telemetry    NSR, HR in 70's - 80's. Tachycardiac at times into the 110's.  ECG    No new tracings.   Cardiac Studies and Radiology    Dg Chest Portable 1 View: Result Date: 07/31/2015 CLINICAL DATA:  Chest pain  today.  Initial encounter. EXAM: PORTABLE CHEST 1 VIEW COMPARISON:  PA and lateral chest 04/27/2011. FINDINGS: The lungs are clear. Heart size is normal. No pneumothorax or pleural effusion. The study is limited by portable technique and patient size. IMPRESSION: No acute disease. Electronically Signed   By: Inge Rise M.D.   On: 07/31/2015 17:50  Cardiac Catheterization: 07/31/2015  Prox RCA to Mid RCA lesion, 10 %stenosed.  Prox LAD to Mid LAD lesion, 10 %stenosed.  A drug eluting stent was successfully placed, and overlaps previously placed stent.  Prox RCA lesion, 100 %stenosed.  Post intervention, there is a 0% residual stenosis.  The left ventricular ejection fraction is 55-65% by visual estimate.  The left ventricular systolic function is normal.  LV end diastolic pressure is normal.  Dist RCA lesion, 70 %stenosed.   1. Acute inferior STEMI secondary to occluded proximal RCA 2. Successful PTCA/DES x 1 proximal RCA 3. There is a 70% stenosis in the distal RCA 4. Patent stents in the LAD with minimal restenosis.  5. Preserved LV systolic function with subtle hypokinesis of the inferior wall  Recommendations: Will admit to ICU. Echo in am. Will continue ASA/Brilinta/statin. She is intolerant of beta blockers. Will hold anti-hypertensives tonight. Restart in am if BP stable. She may need need staged PCI of the distal RCA. Will review with interventional team in the am.   Assessment & Plan    1. STEMI due to stent thrombosis within the proximal RCA - history of CAD with BMS to LAD in 2001, DES to LAD in 2008, staged DES to distal LAD and RCA in 2008. Presented to [facility] for evaluation of chest pain and CODE STEMI was activated. - cardiac catheterization showed 100% stenosis of the proximal RCA and this was treated with a DES. There was 70% stenosis along the distal RCA. This was reviewed by both Dr. Tamala Julian and Dr. Angelena Form and she is for coronary stent intervention of  this lesion later today. - started on DAPT with ASA and Brilinta. Will need to be on DAPT indefinitely. - continue Zetia and statin. BP has borderline hypotensive, therefore unable to start BB at this time. Continue statin therapy and Zetia.  2. HTN - BP has been 92/59 - 151/84 in the past 24 hours. - PTA Diovan held. Would prefer initiation of BB therapy prior to discharge.   3. Hyperlipidemia - check Lipid Panel in AM. - continue Crestor 20mg  daily  and Zetia.If LDL not at goal, increase Crestor to 40mg  daily.   Arna Medici , PA-C 9:03 AM 08/02/2015 Pager: [phone] The patient has been seen in conjunction with Bernerd Pho, PA-C. All aspects of care have been considered and discussed. The patient has been personally interviewed, examined, and all clinical data has been reviewed.   Quiet night. No chest discomfort. Laboratory data is stable for planned staged PCI.  Again noted the relatively low rise in troponin, likely due to the presence of inferior wall collaterals despite total occlusion of the RCA due to stent thrombosis.  Plan elective distal RCA stent today. Lifelong dual antiplatelet therapy given stent thrombosis involving first-generation stents.  Plan resume typical antihypertensive regimen prior to discharge. ARB/HCTZ will be started today. Consider discontinuation of diltiazem and low-dose beta blocker therapy addition.

## 2015-08-02 NOTE — Interval H&P Note (Signed)
History and Physical Interval Note:  08/02/2015 9:16 AM  Shelley [name]  has presented today for surgery, with the diagnosis of existing severe distal RCA disease following STEMI. The various methods of treatment have been discussed with the patient and family. After consideration of risks, benefits and other options for treatment, the patient has consented to  Procedure(s): Coronary Stent Intervention (N/A) as a surgical intervention .    The patient's history has been reviewed, patient examined, no change in status, stable for surgery.  I have reviewed the patient's chart and labs.  Questions were answered to the patient's satisfaction.     Cath Lab Visit (complete for each Cath Lab visit)  Clinical Evaluation Leading to the Procedure:   ACS: No.  Non-ACS:    Anginal Classification: CCS II  Anti-ischemic medical therapy: Minimal Therapy (1 class of medications)  Non-Invasive Test Results: Equivocal test results - the patient had severe distal RCA disease noted after proximal RCA PCI in the setting inferior STEMI. Based on the severity of this disease and the recovering MI in this distribution, both Dr. Angelena Form and Dr. Tamala Julian felt that it was best to proceed with intervention of the severe lesion prior to discharge.  Prior CABG: No previous CABG   Indication 7  Patient Information:    STEMI or NSTEMI and successful PCI of culprit artery during index hospitalization  No symptoms of recurrent myocardial ischemia and/or high-risk findings on noninvasive stress testing performed after index hospitalization  Revascularization of 1 OR MORE additional coronary artery   I (2)  Indication: 7; Score: 2 --> however independent review by 2 IC Cardiologist determined that the lesion is severe & would likely worsen ischemic injury to the recent STEMI vessel.    Glenetta Hew

## 2015-08-03 DIAGNOSIS — I25110 Atherosclerotic heart disease of native coronary artery with unstable angina pectoris: Secondary | ICD-10-CM

## 2015-08-03 DIAGNOSIS — E876 Hypokalemia: Secondary | ICD-10-CM

## 2015-08-03 LAB — LIPID PANEL
CHOL/HDL RATIO: 6.5 ratio
Cholesterol: 209 mg/dL — ABNORMAL HIGH (ref 0–200)
HDL: 32 mg/dL — AB (ref >40)
LDL Cholesterol: 156 mg/dL — ABNORMAL HIGH (ref 0–99)
TRIGLYCERIDES: 105 mg/dL (ref <150)
VLDL: 21 mg/dL (ref 0–40)

## 2015-08-03 LAB — BASIC METABOLIC PANEL
Anion gap: 8 (ref 5–15)
BUN: 9 mg/dL (ref 6–20)
CHLORIDE: 105 mmol/L (ref 101–111)
CO2: 24 mmol/L (ref 22–32)
CREATININE: 0.89 mg/dL (ref 0.44–1.00)
Calcium: 9.3 mg/dL (ref 8.9–10.3)
GFR calc Af Amer: >60 mL/min (ref >60)
GFR calc non Af Amer: >60 mL/min (ref >60)
GLUCOSE: 110 mg/dL — AB (ref 65–99)
POTASSIUM: 3.5 mmol/L (ref 3.5–5.1)
SODIUM: 137 mmol/L (ref 135–145)

## 2015-08-03 LAB — CBC
HEMATOCRIT: 47.9 % — AB (ref 36.0–46.0)
Hemoglobin: 16.1 g/dL — ABNORMAL HIGH (ref 12.0–15.0)
MCH: 28.5 pg (ref 26.0–34.0)
MCHC: 33.6 g/dL (ref 30.0–36.0)
MCV: 84.9 fL (ref 78.0–100.0)
PLATELETS: 281 10*3/uL (ref 150–400)
RBC: 5.64 MIL/uL — ABNORMAL HIGH (ref 3.87–5.11)
RDW: 14 % (ref 11.5–15.5)
WBC: 7.3 10*3/uL (ref 4.0–10.5)

## 2015-08-03 LAB — GLUCOSE, CAPILLARY: Glucose-Capillary: 106 mg/dL — ABNORMAL HIGH (ref 65–99)

## 2015-08-03 MED ORDER — TICAGRELOR 90 MG PO TABS: 90.0000 mg | ORAL_TABLET | Freq: Two times a day (BID) | ORAL | 11 refills | Status: DC

## 2015-08-03 MED ORDER — TICAGRELOR 90 MG PO TABS: 90.0000 mg | ORAL_TABLET | Freq: Two times a day (BID) | ORAL | 0 refills | Status: DC

## 2015-08-03 MED ORDER — POTASSIUM CHLORIDE ER 10 MEQ PO TBCR: 20.0000 meq | EXTENDED_RELEASE_TABLET | Freq: Every day | ORAL | 11 refills | Status: DC

## 2015-08-03 MED ORDER — METOPROLOL TARTRATE 25 MG PO TABS: 12.5000 mg | ORAL_TABLET | Freq: Two times a day (BID) | ORAL | 11 refills | Status: DC

## 2015-08-03 MED ORDER — ASPIRIN EC 81 MG PO TBEC: 81.0000 mg | DELAYED_RELEASE_TABLET | Freq: Once | ORAL | 11 refills | Status: AC

## 2015-08-03 MED ORDER — CARVEDILOL 3.125 MG PO TABS
6.2500 mg | ORAL_TABLET | Freq: Two times a day (BID) | ORAL | Status: DC
  Administered 2015-08-03: 6.25 mg via ORAL
  Filled 2015-08-03: qty 2

## 2015-08-03 MED ORDER — ASPIRIN EC 81 MG PO TBEC: 81.0000 mg | DELAYED_RELEASE_TABLET | Freq: Once | ORAL | 11 refills | Status: DC

## 2015-08-03 MED ORDER — POTASSIUM CHLORIDE CRYS ER 10 MEQ PO TBCR
40.0000 meq | EXTENDED_RELEASE_TABLET | Freq: Once | ORAL | Status: AC
  Administered 2015-08-03: 40 meq via ORAL
  Filled 2015-08-03: qty 4

## 2015-08-03 MED ORDER — CARVEDILOL 6.25 MG PO TABS: 6.2500 mg | ORAL_TABLET | Freq: Two times a day (BID) | ORAL | 11 refills | Status: DC

## 2015-08-03 NOTE — Discharge Summary (Addendum)
Discharge Summary    Patient ID: SUSANN LAWHORNE  MRN: [id number], DOB/AGE: 1955-05-07 60 y.o.  Admit Date: 07/31/2015 Discharge Date: 08/03/2015  Primary Care Provider: [name], MD Primary Cardiologist: Dr. Sallyanne Kuster, MD  Discharge Diagnoses    Principal Problem:   ST elevation myocardial infarction involving right coronary artery [facility]) Active Problems:   Hypokalemia   HTN (hypertension)   Hyperlipidemia   ST elevation myocardial infarction (STEMI) of inferior wall [facility])   Status post coronary artery stent placement   Coronary artery disease involving native coronary artery   Allergies Allergies  Allergen Reactions  . Levothyroxine     Patient thinks it was the dye in the tablets she was allergic to  . Lopressor [Metoprolol Tartrate] Cough    Fatigue   . Metformin And Related Diarrhea  . Norvasc [Amlodipine Besylate]      History of Present Illness     60 year old female with history of CAD s/p prior PCi as below, DM, HTN, obesity who presented to [facility] on 07/31/15 with c/o chest pain and left arm pain. EKG with ST elevation in the inferior leads c/w acute STEMI. Code STEMI was called. She was transferred to [facility] for emergent cardiac catheterization. She has a rich history of coronary disease. In 2001, when she was only 60 years old she received a bare-metal stent to the mid LAD artery. In 2008 she presented with an acute myocardial infarction and received a 3 x 24 mm drug-eluting Taxus stent to the LAD artery. Subsequently underwent staged revascularization with a drug-eluting Cypher 2.5 x 13 mm to the distal LAD artery and a 3.0 x 23 mm drug-eluting Cypher stent to the right coronary artery. She had not required repeat coronary angiography since that time until this admission. Her most recent functional study in July of 2012 was negative for ischemia, showed normal left ventricular systolic function with an ejection fraction of 67%. She never had symptoms of  congestive heart failure.   Hospital Course     Consultants: Cardiac rehab   Emergent cardiac cath on 7/26 showed an occluded RCA s/p PCI/DES x 1. There was also 70-80% stenosis of the distal RCA. Case was reviewed by Drs. Smith and [place] and felt to be clinically significant. She underwent staged PCIDES x 1 to the distal RCA on 7/28 with 0% residual stenosis. Remaining cardiac cath details below. She was advised to continue lifelong DAPT. She was started on aspirin 81 mg and Brilinta 90 mg bid. She has tolerated these without issues. Echo on 7/27 showed a normal EF at 65-70%, no RWMA, GR1DD. She was noted to be hypotensive on the morning of 7/28 leading to temporarily holding her antihypertensives. She has known intolerance to metoprolol. She was started on Coreg, though was changed back to Lopressor at time of discharge given its selective properties. She will also be resumed on HCTZ with KCl supplementation given her long history of hypokalemia. She has ambulated without issues. She does note some fatigue and is deconditioned. She is uncertain if she wants to proceed with cardiac rehab at this time. Some of her tachycardia may be 2/2 residual RV infarction/stunning as she presented with HR in the 30's bpm.    The patient's right radial cath site has been examined is healing well without issues at this time. The patient has been seen by Dr. Sallyanne Kuster and felt to be stable for discharge today. All follow up appointments have been made. Discharge medications are listed  below. Prescriptions have been reviewed with the patient and sent in to their pharmacy.  _____________  Discharge Vitals Blood pressure (!) 143/93, pulse (!) 101, temperature 97.8 F (36.6 C), temperature source Oral, resp. rate 11, height '5\' 5"'  (1.651 m), weight 200 lb 9.9 oz (91 kg), SpO2 96 %.  Filed Weights   07/31/15 1930 08/02/15 0407 08/03/15 0434  Weight: 197 lb 5 oz (89.5 kg) 197 lb (89.4 kg) 200 lb 9.9 oz (91 kg)    Labs  & Radiologic Studies    CBC  Recent Labs  08/01/15 0204 08/03/15 0450  WBC 7.5 7.3  HGB 15.7* 16.1*  HCT 46.1* 47.9*  MCV 84.7 84.9  PLT 275 672   Basic Metabolic Panel  Recent Labs  08/02/15 0401 08/03/15 0450  NA 140 137  K 3.9 3.5  CL 105 105  CO2 24 24  GLUCOSE 105* 110*  BUN 13 9  CREATININE 0.98 0.89  CALCIUM 9.0 9.3   Liver Function Tests No results for input(s): AST, ALT, ALKPHOS, BILITOT, PROT, ALBUMIN in the last 72 hours. No results for input(s): LIPASE, AMYLASE in the last 72 hours. Cardiac Enzymes  Recent Labs  07/31/15 1937 08/01/15 0204 08/01/15 0727  TROPONINI 0.05* 1.28* 2.73*   BNP Invalid input(s): POCBNP D-Dimer No results for input(s): DDIMER in the last 72 hours. Hemoglobin A1C No results for input(s): HGBA1C in the last 72 hours. Fasting Lipid Panel  Recent Labs  08/03/15 0450  CHOL 209*  HDL 32*  LDLCALC 156*  TRIG 105  CHOLHDL 6.5   Thyroid Function Tests No results for input(s): TSH, T4TOTAL, T3FREE, THYROIDAB in the last 72 hours.  Invalid input(s): FREET3 _____________  Dg Chest Portable 1 View  Result Date: 07/31/2015 CLINICAL DATA:  Chest pain today.  Initial encounter. EXAM: PORTABLE CHEST 1 VIEW COMPARISON:  PA and lateral chest 04/27/2011. FINDINGS: The lungs are clear. Heart size is normal. No pneumothorax or pleural effusion. The study is limited by portable technique and patient size. IMPRESSION: No acute disease. Electronically Signed   By: Inge Rise M.D.   On: 07/31/2015 17:50   Diagnostic Studies/Procedures   Cardiac cath 07/31/2015: Coronary Findings   Dominance: Right  Left Anterior Descending  Vessel is large.  Prox LAD to Mid LAD lesion, 10% stenosed. The lesion was previously treated using a drug eluting stent over 2 years ago.  First Diagonal Branch  Vessel is moderate in size.  Second Diagonal Branch  Vessel is small in size.  Second Septal Branch  Vessel is small in size.  Third  Diagonal Branch  Vessel is small in size.  Third Septal Branch  Vessel is small in size.  Left Circumflex  Vessel is large.  First Obtuse Marginal Branch  Vessel is small in size.  Third Obtuse Marginal Branch  Vessel is moderate in size. 3rd Mrg filled by collaterals from 3rd Mrg.  Right Coronary Artery  Prox RCA lesion, 100% stenosed. The lesion is type C and thrombotic. The lesion was not previously treated.  Angioplasty: Pre-stent angioplasty was performed using a BALLOON EMERGE MR 2.5X12. A drug eluting stent was successfully placed, and overlaps previously placed stent. Stent strut is well apposed. Post-stent angioplasty was performed using a BALLOON  EMERGE MR 3.5X12. There is no pre-interventional antegrade distal flow (TIMI 0). The post-interventional distal flow is normal (TIMI 3). The intervention was successful . No complications occurred at this lesion.  There is no residual stenosis post intervention.  Prox RCA to  Mid RCA lesion, 10% stenosed. The lesion was previously treated using a drug eluting stent over 2 years ago.  Dist RCA lesion, 70% stenosed.  Right Posterior Descending Artery  RPDA filled by collaterals from 3rd Mrg.  Wall Motion              Left Heart   Left Ventricle The left ventricular size is normal. The left ventricular systolic function is normal. LV end diastolic pressure is normal. The left ventricular ejection fraction is 55-65% by visual estimate. There are LV function abnormalities due to segmental dysfunction.    Coronary Diagrams   Diagnostic Diagram     Post-Intervention Diagram      Cardiac cath 08/02/2015: Coronary Findings   Dominance: Right  Left Main  Vessel was not injected.  Left Anterior Descending  Vessel is large.  Prox LAD to Mid LAD lesion, 10% stenosed. The lesion was previously treated using a drug eluting stent over 2 years ago.  First Diagonal Branch  Vessel is moderate in size.  Second Diagonal Branch  Vessel  is small in size.  Second Septal Branch  Vessel is small in size.  Third Diagonal Branch  Vessel is small in size.  Third Septal Branch  Vessel is small in size.  Left Circumflex  Vessel is large.  First Obtuse Marginal Branch  Vessel is small in size.  Third Obtuse Marginal Branch  Vessel is moderate in size. 3rd Mrg filled by collaterals from 3rd Mrg.  Right Coronary Artery  Prox RCA lesion, 0% stenosed. Previously placed Prox RCA drug eluting stent is widely patent. The lesion is type C. Widely patent recently placed stent as well as overlap site.  Prox RCA to Mid RCA lesion, 10% stenosed. The lesion was previously treated using a drug eluting stent over 2 years ago.  Dist RCA lesion, 80% stenosed.  Angioplasty: Lesion length: 14 mm. Lesion crossed with guidewire using a WIRE HI TORQ BMW 190CM. Pre-stent angioplasty was performed using a BALLOON EMERGE MR 2.5X12. Maximum pressure: 10 atm. Inflation time: 30 sec. A STENT PROMUS PREM MR 3.0X16 drug eluting stent was successfully placed, and does not overlap previously placed stent. Minimum lumen area: 3.2 mm. Stent strut is well apposed. Post-stent angioplasty was performed using a STENT PROMUS PREM MR 3.0X16. Maximum pressure: 18 atm. Inflation time: 20 sec. The pre-interventional distal flow is normal (TIMI 3). The post-interventional distal flow is normal (TIMI 3). The intervention was successful . CATH VISTA GUIDE 6FR JR4 ([id number])  There is no residual stenosis post intervention.  Right Posterior Descending Artery  RPDA filled by collaterals from 3rd Mrg.  Coronary Diagrams   Diagnostic Diagram     Post-Intervention Diagram      Echo 08/01/2015: Study Conclusions  - Procedure narrative: Transthoracic echocardiography. Image   quality was adequate. The study was technically difficult.   Intravenous contrast (Definity) was administered. - Left ventricle: The cavity size was normal. There was mild   concentric hypertrophy.  Systolic function was vigorous. The   estimated ejection fraction was in the range of 65% to 70%. Wall   motion was normal; there were no regional wall motion   abnormalities. Doppler parameters are consistent with abnormal   left ventricular relaxation (grade 1 diastolic dysfunction). _____________  Disposition   Pt is being discharged home today in good condition.  Follow-up Plans & Appointments     Discharge Instructions    AMB Referral to [facility]    Complete by:  As  directed   Please assign [employer] member for post toc. Currently followed by [facility] Pharmacist. Thanks. Marthenia Rolling, [place], [facility] Liaison [phone]   Reason for consult:  Please assign U[employer] member for post toc   Expected date of contact:  1-3 days (reserved for hospital discharges)   Amb Referral to Cardiac Rehabilitation    Complete by:  As directed   Diagnosis:   STEMI PTCA Coronary Stents        Discharge Medications   Current Discharge Medication List    START taking these medications   Details  potassium chloride (K-DUR) 10 MEQ tablet Take 2 tablets (20 mEq total) by mouth daily. Qty: 60 tablet, Refills: 11    !! ticagrelor (BRILINTA) 90 MG TABS tablet Take 1 tablet (90 mg total) by mouth 2 (two) times daily. Qty: 60 tablet, Refills: 11    !! ticagrelor (BRILINTA) 90 MG TABS tablet Take 1 tablet (90 mg total) by mouth 2 (two) times daily. Qty: 60 tablet, Refills: 0     !! - Potential duplicate medications found. Please discuss with provider.    CONTINUE these medications which have CHANGED   Details  aspirin EC 81 MG tablet Take 1 tablet (81 mg total) by mouth once. Qty: 30 tablet, Refills: 11      CONTINUE these medications which have NOT CHANGED   Details  albuterol (PROAIR HFA) 108 (90 BASE) MCG/ACT inhaler Inhale 2 puffs into the lungs every 6 (six) hours as needed. For coughing and/or wheezing    canagliflozin (INVOKANA) 100 MG TABS tablet Take 100  mg by mouth daily before breakfast.    !! thyroid (ARMOUR) 60 MG tablet Take 60 mg by mouth every morning. Take one 16m tablet Friday-Sunday. Take one 60 mg tab with one 128mtab (7564mMonday through Thursday).    CRESTOR 20 MG tablet TAKE 1 TABLET BY MOUTH DAILY. Qty: 30 tablet, Refills: 6    ezetimibe (ZETIA) 10 MG tablet TAKE 1 TABLET (10 MG TOTAL) BY MOUTH DAILY. Qty: 30 tablet, Refills: 11    fexofenadine (ALLEGRA) 180 MG tablet Take 180 mg by mouth daily.     TAZTIA XT 180 MG 24 hr capsule TAKE 2 CAPSULES BY MOUTH DAILY. Qty: 60 capsule, Refills: 11    !! thyroid (ARMOUR) 15 MG tablet Take 15 mg by mouth every morning. Take one tablet with one 10m49mblet for every morning before breakfast for a total 75mg43ms Monday-Thursday    valsartan-hydrochlorothiazide (DIOVAN-HCT) 160-12.5 MG per tablet TAKE 1 TABLET BY MOUTH DAILY Qty: 90 tablet, Refills: 3     !! - Potential duplicate medications found. Please discuss with provider.       Aspirin prescribed at discharge?  Yes High Intensity Statin Prescribed? (Lipitor 40-80mg 33mrestor 20-40mg):23m Beta Blocker Prescribed? Yes For EF <40%, was ACEI/ARB Prescribed? No: EF > 40% ADP Receptor Inhibitor Prescribed? (i.e. Plavix etc.-Includes Medically Managed Patients): Yes For EF <40%, Aldosterone Inhibitor Prescribed? No: EF > 40% Was EF assessed during THIS hospitalization? Yes Was Cardiac Rehab II ordered? (Included Medically managed Patients): Yes   Outstanding Labs/Studies   none  Duration of Discharge Encounter   Greater than 30 minutes including physician time.  Signed,[facility]: [phone], 10:16 AM   Addendum; BeverlyLizanny concerned about taking beta blockers, which worsened her asthma in the past. She has normal LV function. Will resume diltiazem and discuss further as outpatient.  Mihai CSanda Klein  [facility] [phone] office [phone]  pager 10:16 AM

## 2015-08-03 NOTE — Progress Notes (Signed)
CARDIAC REHAB PHASE I   PRE:  Rate/Rhythm: 98 sinus rhythm  BP:  Supine:   Sitting: 140/75  Standing:    SaO2: 985 ra   MODE:  Ambulation: 800 ft   POST:  Rate/Rhythem: 133 sinus tach  BP:  Supine:   Sitting: 148/79  Standing:    SaO2: 99% ra   Pt ambulated in hallway x 1 assist, asymptomatic, tolerated well.  Education reinforced including exercise guidelines, medication compliance and outpatient cardiac rehab. Pt verbalized understanding.   [employer]

## 2015-08-03 NOTE — Discharge Instructions (Signed)
Acute Coronary Syndrome °Acute coronary syndrome (ACS) is a serious problem in which there is suddenly not enough blood and oxygen supplied to the heart. ACS may mean that one or more of the blood vessels in your heart (coronary arteries) may be blocked. ACS can result in chest pain or a heart attack (myocardial infarction or MI). °CAUSES °This condition is caused by atherosclerosis, which is the buildup of fat and cholesterol (plaque) on the inside of the arteries. Over time, the plaque may narrow or block the artery, and this will lessen blood flow to the heart. Plaque can also become weak and break off within a coronary artery to form a clot and cause a sudden blockage. °RISK FACTORS °The risks factors of this condition include: °· High cholesterol levels. °· High blood pressure (hypertension). °· Smoking. °· Diabetes. °· Age. °· Family history of chest pain, heart disease, or stroke. °· Lack of exercise. °SYMPTOMS °The most common signs of this condition include: °· Chest pain, which can be: °¨ A crushing or squeezing in the chest. °¨ A tightness, pressure, fullness, or heaviness in the chest. °¨ Present for more than a few minutes, or it can stop and recur. °· Pain in the arms, neck, jaw, or back. °· Unexplained heartburn or indigestion. °· Shortness of breath. °· Nausea. °· Sudden cold sweats. °· Feeling light-headed or dizzy. °Sometimes, this condition has no symptoms. °DIAGNOSIS °ACS may be diagnosed through the following tests: °· Electrocardiogram (ECG). °· Blood tests. °· Coronary angiogram. This is a procedure to look at the coronary arteries to see if there is any blockage. °TREATMENT °Treatment for ACS may include: °· Healthy behavioral changes to reduce or control risk factors. °· Medicine. °· Coronary stenting. A stent helps to keep an artery open. °· Coronary angioplasty. This procedure widens a narrowed or blocked artery. °· Coronary artery bypass surgery. This will allow your blood to pass the  blockage (bypass) to reach your heart. °HOME CARE INSTRUCTIONS °Eating and Drinking °· Follow a heart-healthy diet. A dietitian can you help to educate you about healthy food options and changes. °· Use healthy cooking methods such as roasting, grilling, broiling, baking, poaching, steaming, or stir-frying. Talk to a dietitian to learn more about healthy cooking methods. °Medicines °· Take medicines only as directed by your health care provider. °· Do not take the following medicines unless your health care provider approves: °¨ Nonsteroidal anti-inflammatory drugs (NSAIDs), such as ibuprofen, naproxen, or celecoxib. °¨ Vitamin supplements that contain vitamin A, vitamin E, or both. °¨ Hormone replacement therapy that contains estrogen with or without progestin. °· Stop illegal drug use. °Activities °· Follow an exercise program that is approved by your health care provider. °· Plan rest periods when you are fatigued. °Lifestyle °· Do not use any tobacco products, including cigarettes, chewing tobacco, or electronic cigarettes. If you need help quitting, ask your health care provider. °· If you drink alcohol, and your health care provider approves, limit your alcohol intake to no more than 1 drink per day. One drink equals 12 ounces of beer, 5 ounces of wine, or 1½ ounces of hard liquor. °· Learn to manage stress. °· Maintain a healthy weight. Lose weight as approved by your health care provider. °General Instructions °· Manage other health conditions, such as hypertension and diabetes, as directed by your health care provider. °· Keep all follow-up visits as directed by your health care provider. This is important. °· Your health care provider may ask you to monitor your blood   pressure. A blood pressure reading consists of a higher number over a lower number, such as 110 over 72, written as 110/72. Ideally, your blood pressure should be:  Below 140/90 if you have no other medical conditions.  Below 130/80 if  you have diabetes or kidney disease. SEEK IMMEDIATE MEDICAL CARE IF:  You have pain in your chest, neck, arm, jaw, stomach, or back that lasts more than a few minutes, is recurring, or is not relieved by taking medicine under your tongue (sublingual nitroglycerin).  You have profuse sweating without cause.  You have unexplained:  Heartburn or indigestion.  Shortness of breath or difficulty breathing.  Nausea or vomiting.  Fatigue.  Feelings of nervousness or anxiety.  Weakness.  Diarrhea.  You have sudden light-headedness or dizziness.  You faint. These symptoms may represent a serious problem that is an emergency. Do not wait to see if the symptoms will go away. Get medical help right away. Call your local emergency services (911 in the [place] Do not drive yourself to the clinic or hospital.    Please do not miss your doses of aspirin 81 mg and Brilinta 90 mg twice daily. If you have any concerns call the office.   This information is not intended to replace advice given to you by your health care provider. Make sure you discuss any questions you have with your health care provider.   Document Released: 12/22/2004 Document Revised: 01/12/2014 Document Reviewed: 04/25/2013 Elsevier Interactive Patient Education [employer].

## 2015-08-03 NOTE — Progress Notes (Signed)
Patient: Shelley [name] / Admit Date: 07/31/2015 / Date of Encounter: 08/03/2015, 7:15 AM   Subjective: 60 yo female w/ PMH of CAD (s/p BMS to LAD in 2001, DES to LAD in 2008, staged DES to distal LAD and RCA in 2008)Type 2 DM, HTN, and HLD who presented to [facility] on 7/26 for evaluation of chest pain. CODE STEMI acitvated and she was transferred to [facility] for emergent cardiac catheterization on 7/26 that showed occluded proximal RCA s/p PCI/DES at that time, s/p elective staged PCI/DES to distal RCA given 70% stenosis seen on cath from 7/26. Echo with [facility] from 7/27 showed EF of 65-70%, no RWMA, GR1DD. BP previously soft, now running in the [id number] to [id number] systolic. Multiple medication intolerances, including beta blockers.   She is somewhat fatigued this morning. Up ambulating in her room upon entering. No further chest pain. No SOB. Tolerating medications without issues. Considering working with cardiac rehab. Potassium low this morning, she reports a long history of this.   Review of Systems: Review of Systems  Constitutional: Positive for malaise/fatigue. Negative for chills, diaphoresis, fever and weight loss.  HENT: Negative for congestion.   Eyes: Negative for discharge and redness.  Respiratory: Negative for cough, sputum production, shortness of breath and wheezing.   Cardiovascular: Negative for chest pain, palpitations, orthopnea, claudication, leg swelling and PND.  Gastrointestinal: Negative for abdominal pain, heartburn, nausea and vomiting.  Musculoskeletal: Negative for falls and myalgias.  Skin: Negative for rash.  Neurological: Negative for dizziness, tingling, tremors, sensory change, speech change, focal weakness, loss of consciousness and weakness.  Endo/Heme/Allergies: Does not bruise/bleed easily.  Psychiatric/Behavioral: Negative for substance abuse. The patient is not nervous/anxious.     Objective: Telemetry: NSR, 90's to low 100's bpm  currently. Overnight with sinus tachycardia into the 120's bpm, rare PVC Physical Exam: Blood pressure (!) 143/93, pulse (!) 101, temperature 97.8 F (36.6 C), temperature source Oral, resp. rate 16, height 5\' 5"  (1.651 m), weight 200 lb 9.9 oz (91 kg), SpO2 96 %. Body mass index is 33.38 kg/m. General: Well developed, well nourished, in no acute distress. Head: Normocephalic, atraumatic, sclera non-icteric, no xanthomas, nares are without discharge. Neck: Negative for carotid bruits. JVP not elevated. Lungs: Clear bilaterally to auscultation without wheezes, rales, or rhonchi. Breathing is unlabored. Heart: RRR S1 S2 without murmurs, rubs, or gallops. Cath site well healing without bleeding, bruising, swelling, erythema, or TTP. Distal pulse 2+.  Abdomen: Soft, non-tender, non-distended with normoactive bowel sounds. No rebound/guarding. Extremities: No clubbing or cyanosis. No edema. Distal pedal pulses are 2+ and equal bilaterally. Neuro: Alert and oriented X 3. Moves all extremities spontaneously. Psych:  Responds to questions appropriately with a normal affect.   Intake/Output Summary (Last 24 hours) at 08/03/15 0715 Last data filed at 08/02/15 2105  Gross per 24 hour  Intake              760 ml  Output             2625 ml  Net            -1865 ml    Inpatient Medications:  . angioplasty book   Does not apply Once  . aspirin  81 mg Oral Daily  . canagliflozin  100 mg Oral QAC breakfast  . carvedilol  3.125 mg Oral BID WC  . ezetimibe  10 mg Oral Daily  . hydrochlorothiazide  12.5 mg Oral Daily  . insulin aspart  0-15  Units Subcutaneous TID WC  . irbesartan  150 mg Oral Daily  . loratadine  10 mg Oral Daily  . rosuvastatin  20 mg Oral Daily  . sodium chloride flush  3 mL Intravenous Q12H  . sodium chloride flush  3 mL Intravenous Q12H  . thyroid  60 mg Oral Once per day on Sun Fri Sat  . thyroid  75 mg Oral Once per day on Mon Tue Wed Thu  . ticagrelor  90 mg Oral BID    Infusions:    Labs:  Recent Labs  08/02/15 0401 08/03/15 0450  NA 140 137  K 3.9 3.5  CL 105 105  CO2 24 24  GLUCOSE 105* 110*  BUN 13 9  CREATININE 0.98 0.89  CALCIUM 9.0 9.3   No results for input(s): AST, ALT, ALKPHOS, BILITOT, PROT, ALBUMIN in the last 72 hours.  Recent Labs  08/01/15 0204 08/03/15 0450  WBC 7.5 7.3  HGB 15.7* 16.1*  HCT 46.1* 47.9*  MCV 84.7 84.9  PLT 275 281    Recent Labs  07/31/15 1715 07/31/15 1937 08/01/15 0204 08/01/15 0727  TROPONINI 0.03* 0.05* 1.28* 2.73*   Invalid input(s): POCBNP No results for input(s): HGBA1C in the last 72 hours.   Weights: Filed Weights   07/31/15 1930 08/02/15 0407 08/03/15 0434  Weight: 197 lb 5 oz (89.5 kg) 197 lb (89.4 kg) 200 lb 9.9 oz (91 kg)     Radiology/Studies:  Dg Chest Portable 1 View  Result Date: 07/31/2015 CLINICAL DATA:  Chest pain today.  Initial encounter. EXAM: PORTABLE CHEST 1 VIEW COMPARISON:  PA and lateral chest 04/27/2011. FINDINGS: The lungs are clear. Heart size is normal. No pneumothorax or pleural effusion. The study is limited by portable technique and patient size. IMPRESSION: No acute disease. Electronically Signed   By: Inge Rise M.D.   On: 07/31/2015 17:50    Assessment and Plan  Principal Problem:   ST elevation myocardial infarction involving right coronary artery (HCC) Active Problems:   Hypokalemia   HTN (hypertension)   Hyperlipidemia   ST elevation myocardial infarction (STEMI) of inferior wall [facility])   Status post coronary artery stent placement   Coronary artery disease involving native coronary artery    1. Inferior STEMI due to stent thrombosis within the proximal RCA/CAD as above: -History of CAD with BMS to LAD in 2001, DES to LAD in 2008, staged DES to distal LAD and RCA in 2008. Presented to [facility] for evaluation of chest pain and CODE STEMI was activated on 7/26. -Cardiac catheterization on 7/26 showed 100% stenosis of the proximal  RCA and this was treated with a DES. There was 70% stenosis along the distal RCA. This was reviewed by both Dr. Tamala Julian and Dr. Angelena Form and felt to be clinically significant at 70-80% stenosis. She is now s/p stage PCI/DES to the distal RCA as well as the proximal RCA as above.  -Started on DAPT with ASA and Brilinta. Will need to be on DAPT indefinitely. -Continue Zetia and statin. BP has borderline hypotensive, therefore unable to start BB at this time.  -BP previously borderline precluding the initiation of BB previously. She also has multiple medication intolerances, including BB.  -Tolerating Coreg 3.125 mg bid so far. Titrate to 6.25 mg bid given HTN and sinus tachycardia. -Sinus tachycardia with ambulation likely from deconditioning.  -Cardiac rehab, if she wishes to pursue this.   2. HTN: -No longer soft -Coreg as above -Resume HCTZ with low-dose potassium supplementation. -  She prefers KCl tabs 10 meq x 2 daily over 20 meq given size and difficulty passing the medication.   3. HLD: -Goal LDL <70 -Crestor -Recheck lipids in several months  4. Hypokalemia: -Replete to 4.0 this morning -Will need low-dose KCl supplementation upon discharge -Consider endocrinology work up as an outpatient given persistent hypokalemia off diuretics and HTN   Signed, Marcille Blanco [place] Pager: [phone] 08/03/2015, 7:15 AM  I have seen and examined the patient along with Christell Faith, PA-C.  I have reviewed the chart, notes and new data.  I agree with PA's note.  Key new complaints: feels just slightly breathless with activity, no wheezing and no coughing Key examination changes: no edema, JVD or S3. Borderline tachycardic Key new findings / data: ECG with mild anterolateral T wave inversion, similar to chronic ECGs. Echo shows normal LVEF and no evidence of increased LA pressure. Troponin rise was minimal.  PLAN: She was in tolerant to beta blockers in the past due to cough and  dyspnea. Switch to a selective agent: metoprolol; would anticipate worse lung side effects with carvedilol. Elevated LDL suggests non compliance (her LDL was 102 in January). Reinforced critical role of compliance with dual antiplatelet agents for a year and life-long lipid lowering.  Sanda Klein, MD, [place] [phone] 08/03/2015, 8:05 AM

## 2015-08-06 ENCOUNTER — Encounter: Payer: Self-pay | Admitting: *Deleted

## 2015-08-06 ENCOUNTER — Telehealth: Payer: Self-pay | Admitting: Cardiovascular Disease

## 2015-08-06 ENCOUNTER — Other Ambulatory Visit: Payer: Self-pay | Admitting: *Deleted

## 2015-08-06 NOTE — Telephone Encounter (Signed)
LMTCB

## 2015-08-06 NOTE — Patient Outreach (Signed)
[facility]) Care Management  08/06/2015  Shelley [name] 12-29-1955 [id number]   Subjective: Telephone call to patient's home number, spoke with patient, and HIPAA verified.  Patient states she is doing well.     Patient gave [facility] verbal authorization to speak with husband Akshara Raxter) regarding her healthcare as needed.   Discussed [facility] Care Management [facility] Transition of care follow up, Care Management services, and patient in agreement to receive follow up.  Patient states everything is going well.  States she currently uses the [facility] for pharmacy needs, is able to afford medications, and was not able to use free initial copay card for her Brilinta. RNCM advised patient to follow up with [facility] pharmacist regarding reimbursement of the $24 out of pocket cost for the Br[place]ant prescription since the [facility] outpatient pharmacy was not open.    Patient voices understanding and states she will follow up.  Patient states she has started the medical leave paperwork with [employer].  RNCM advised patient to obtain copies of all medical leave papers from providers and [employer] for her records, patient in agreement, and voice understanding.  Patient states she is in the process of establishing herself with her new primary MD (Dr. Jani [name]), since her previous MD has retired.   States she will call to set up an appointment.  States she has follow up appointments set up with other specialist as needed.  States she will also follow up to verify date of cardiologist appointment with MD's office today if no call received. Patient states she has critical illness insurance and she has reached out to the provider and is waiting for paperwork to initiate the claims process.   RNCM educated patient on general information needed to file supplemental insurance claims and where to obtain information.    Patient voices understanding and states she will follow up with provider  and request a copy of the policy.   Patient states she does not have any additional transition of care follow up, care coordination, disease monitoring, disease education, community resource, or transportation needs at this time.    Patient states she is very appreciative of the follow up that she has received ( a visit from [place] to [employer], a visit from the [place] and a call from this [facility]).  States she did not know these services existed for [facility] employee, and states we do a great job !!!   Patient in agreement to receive [facility] information for future reference.    Objective: Per chart review: Patient hospitalized  07/31/15 - 08/03/15 for ST elevation myocardial infarction involving right coronary artery and hypokalemia.  Patient also has a history of hyperlipidemia, diabetes, hypertension, ST elevation myocardial infarction (STEMI) of inferior wall, Status post coronary artery stent placement, and Coronary artery disease involving native coronary artery.   Assessment:  Received [facility] Transition of care referral on 08/02/15.    Transition of care follow up completed.   No Telephonic RNCM needs at this time.   Patient will continue to receive [facility] services.  Plan:  RNCM will send patient successful outreach letter, [facility] pamphlet, and magnet. RNCM will send case closure due to follow up completed / no care management needs request to [facility].   Bernardina Cacho H. Annia Friendly, BSN, [facility] Telephonic CM Phone: [phone] Fax: [phone]

## 2015-08-06 NOTE — Telephone Encounter (Signed)
°  Follow Up   Pt has a question regarding her upcoming follow up appointment. Please call.

## 2015-08-07 NOTE — Telephone Encounter (Signed)
Wanting to know if she goes back to work after the appt or within the 2wks

## 2015-08-07 NOTE — Telephone Encounter (Signed)
Left message for pt to call.

## 2015-08-07 NOTE — Telephone Encounter (Signed)
Left message on voicemail, call back if any further questions

## 2015-08-20 NOTE — Progress Notes (Signed)
Cardiology Office Note    Date:  08/21/2015   ID:  Shelley [name], DOB 08-13-1955, MRN [id number]  PCP:  [name], MD  Cardiologist:  Dr. Sallyanne Kuster  CC: post hospital follow up   History of Present Illness:  Shelley [name] is a 60 y.o. female with a history of CAD s/p multiple previous PCI, DM, HTN, and obesity and recent STEMI s/p DES to RCA with staged PCI/DES to distal RCA who presents to clinic for post hospital follow up.  She initially presented to [facility] on 07/31/15 with c/o chest pain and left arm pain. EKG with ST elevation in the inferior leads c/w acute STEMI. Code STEMI was called. She was transferred to [facility] for emergent cardiac catheterization. She has a rich history of coronary disease. In 2001, when she was only 60 years old she received a bare-metal stent to the mid LAD artery. In 2008 she presented with an acute myocardial infarction and received a 3 x 24 mm drug-eluting Taxus stent to the LAD artery. Subsequently underwent staged revascularization with a drug-eluting Cypher 2.5 x 13 mm to the distal LAD artery and a 3.0 x 23 mm drug-eluting Cypher stent to the right coronary artery. She had not required repeat coronary angiography since that time until this admission. Her most recent functional study in July of 2012 was negative for ischemia, showed normal left ventricular systolic function with an ejection fraction of 67%. She never had symptoms of congestive heart failure.   Emergent cardiac cath on 7/26 showed an occluded RCA s/p PCI/DES x 1. There was also 70-80% stenosis of the distal RCA. Case was reviewed by Drs. Smith and [place] and felt to be clinically significant. She underwent staged PCIDES x 1 to the distal RCA on 7/28 with 0% residual stenosis. She was advised to continue lifelong DAPT. She was started on aspirin 81 mg and Brilinta 90 mg bid. Echo on 7/27 showed a normal EF at 65-70%, no RWMA, GR1DD. She was noted to be hypotensive on the  morning of 7/28 leading to temporarily holding her antihypertensives. She has known intolerance to metoprolol. She was started on Coreg, though was changed back to Lopressor at time of discharge given its selective properties, but then never prescribed. She was resumed on HCTZ with KCl supplementation given her long history of hypokalemia.  Today she presents to clinic for follow up. She hasn't had anymore chest pain, but is still having significant SOB. She gets SOB with any activity at all, even taking a bath gives her SOB. No LE edema, orthopnea or PND. No dizziness or syncope. Just feels very SOB and doesn't understand why because it was never like this before. Also has a vesicular rash on left forearm that burns.   Past Medical History:  Diagnosis Date  . CAD (coronary artery disease)   . Cardiac arrhythmia   . DES exposure in utero   . Diabetes mellitus   . Dyslipidemia   . Hypertension   . MI (myocardial infarction) ([place])   . Thyroid disease     Past Surgical History:  Procedure Laterality Date  . BACK SURGERY  5/99 & 6/99  . CARDIAC CATHETERIZATION  01/12/2002   stent patent  . CARDIAC CATHETERIZATION  09/03/2006   patent stents  . CARDIAC CATHETERIZATION N/A 07/31/2015   Procedure: Left Heart Cath and Coronary Angiography;  Surgeon: Burnell Blanks, MD;  Location: [place] [facility];  Service: Cardiovascular;  Laterality: N/A;  . CARDIAC  CATHETERIZATION N/A 07/31/2015   Procedure: Coronary Stent Intervention;  Surgeon: Burnell Blanks, MD;  Location: [place] [facility];  Service: Cardiovascular;  Laterality: N/A;  . CARDIAC CATHETERIZATION N/A 08/02/2015   Procedure: Coronary Stent Intervention;  Surgeon: Leonie Man, MD;  Location: [place] [facility];  Service: Cardiovascular;  Laterality: N/A;  . CORONARY ANGIOPLASTY WITH STENT PLACEMENT  07/17/1998   stent LAD  . CORONARY ANGIOPLASTY WITH STENT PLACEMENT  01/07/2006   stent RCA,LAD stent patent  . CORONARY  ANGIOPLASTY WITH STENT PLACEMENT  03/18/2006   stent to mid RCA,prox.RCA and LAD stent patent  . CRYOTHERAPY    . THYROID SURGERY     PT. DENIES/RADIATION  . TUBAL LIGATION      Current Medications: Outpatient Medications Prior to Visit  Medication Sig Dispense Refill  . albuterol (PROAIR HFA) 108 (90 BASE) MCG/ACT inhaler Inhale 2 puffs into the lungs every 6 (six) hours as needed. For coughing and/or wheezing    . canagliflozin (INVOKANA) 100 MG TABS tablet Take 100 mg by mouth daily before breakfast.    . CRESTOR 20 MG tablet TAKE 1 TABLET BY MOUTH DAILY. 30 tablet 6  . ezetimibe (ZETIA) 10 MG tablet TAKE 1 TABLET (10 MG TOTAL) BY MOUTH DAILY. 30 tablet 11  . fexofenadine (ALLEGRA) 180 MG tablet Take 180 mg by mouth daily.     . potassium chloride (K-DUR) 10 MEQ tablet Take 2 tablets (20 mEq total) by mouth daily. 60 tablet 11  . TAZTIA XT 180 MG 24 hr capsule TAKE 2 CAPSULES BY MOUTH DAILY. 60 capsule 11  . thyroid (ARMOUR) 15 MG tablet Take 15 mg by mouth every morning. Take one tablet with one 60mg  tablet for every morning before breakfast for a total 75mg  days Monday-Thursday    . thyroid (ARMOUR) 60 MG tablet Take 60 mg by mouth every morning. Take one 60mg  tablet Friday-Sunday. Take one 60 mg tab with one 15mg  tab (75mg ) Monday through Thursday).    . ticagrelor (BRILINTA) 90 MG TABS tablet Take 1 tablet (90 mg total) by mouth 2 (two) times daily. 60 tablet 0  . valsartan-hydrochlorothiazide (DIOVAN-HCT) 160-12.5 MG per tablet TAKE 1 TABLET BY MOUTH DAILY 90 tablet 3  . ticagrelor (BRILINTA) 90 MG TABS tablet Take 1 tablet (90 mg total) by mouth 2 (two) times daily. 60 tablet 11   No facility-administered medications prior to visit.      Allergies:   Levothyroxine; Lopressor [metoprolol tartrate]; Metformin and related; and Norvasc [amlodipine besylate]   Social History   Social History  . Marital status: Married    Spouse name: N/A  . Number of children: N/A  . Years of  education: N/A   Occupational History  . EKG [employer]   Social History Main Topics  . Smoking status: Former Research scientist (life sciences)  . Smokeless tobacco: Former Systems developer    Quit date: 01/05/2005  . Alcohol use No  . Drug use: No  . Sexual activity: Yes    Partners: Male   Other Topics Concern  . None   Social History Narrative  . None     Family History:  The patient's family history includes Esophageal cancer in her maternal aunt; Hypertension in her mother; Prostate cancer in her maternal uncle; Thyroid disease in her mother.     ROS:   Please see the history of present illness.    ROS All other systems reviewed and are negative.   PHYSICAL EXAM:   VS:  BP 124/78   Pulse (!) 105   Ht 5\' 5"  (1.651 m)   Wt 192 lb 1.9 oz (87.1 kg)   BMI 31.97 kg/m    GEN: Well nourished, well developed, in no acute distress  HEENT: normal  Neck: no JVD, carotid bruits, or masses Cardiac: tachy; no murmurs, rubs, or gallops,no edema  Respiratory:  clear to auscultation bilaterally, normal work of breathing GI: soft, nontender, nondistended, + BS MS: no deformity or atrophy  Skin: warm and dry, vesicular rash on L forarm Neuro:  Alert and Oriented x 3, Strength and sensation are intact Psych: euthymic mood, full affect  Wt Readings from Last 3 Encounters:  08/21/15 192 lb 1.9 oz (87.1 kg)  08/03/15 200 lb 9.9 oz (91 kg)  06/24/15 198 lb (89.8 kg)      Studies/Labs Reviewed:   EKG:  EKG is ordered today.  The ekg ordered today demonstrates  Sinus tachy HR 105  Recent Labs: 08/03/2015: BUN 9; Creatinine, Ser 0.89; Hemoglobin 16.1; Platelets 281; Potassium 3.5; Sodium 137   Lipid Panel    Component Value Date/Time   CHOL 209 (H) 08/03/2015 0450   TRIG 105 08/03/2015 0450   HDL 32 (L) 08/03/2015 0450   CHOLHDL 6.5 08/03/2015 0450   VLDL 21 08/03/2015 0450   LDLCALC 156 (H) 08/03/2015 0450    Additional studies/ records that were reviewed today include:  Cardiac cath  07/31/2015: Conclusion   Prox RCA to Mid RCA lesion, 10 %stenosed.  Prox LAD to Mid LAD lesion, 10 %stenosed.  A drug eluting stent was successfully placed, and overlaps previously placed stent.  Prox RCA lesion, 100 %stenosed.  Post intervention, there is a 0% residual stenosis.  The left ventricular ejection fraction is 55-65% by visual estimate.  The left ventricular systolic function is normal.  LV end diastolic pressure is normal.  Dist RCA lesion, 70 %stenosed.   1. Acute inferior STEMI secondary to occluded proximal RCA 2. Successful PTCA/DES x 1 proximal RCA 3. There is a 70% stenosis in the distal RCA 4. Patent stents in the LAD with minimal restenosis.  5. Preserved LV systolic function with subtle hypokinesis of the inferior wall Recommendations: Will admit to ICU. Echo in am. Will continue ASA/Brilinta/statin. She is intolerant of beta blockers. Will hold anti-hypertensives tonight. Restart in am if BP stable. She may need need staged PCI of the distal RCA. Will review with interventional team in the am.      Cardiac cath 08/02/2015: Conclusion    Dist RCA lesion, 80 %stenosed. - for planned PCI.  A STENT PROMUS PREM MR 3.0X16 drug eluting stent was successfully placed, and does not overlap previously placed stent.  Post intervention, there is a 0% residual stenosis.  Recent Prox RCA Promus Premier DES stent, 0 %stenosed.  Prox RCA to Mid RCA lesion, 10 %stenosed - in-stent restenosis Successful distal RCA PCI with a single DES stent with excellent result.     Echo 08/01/2015: Study Conclusions - Procedure narrative: Transthoracic echocardiography. Image quality was adequate. The study was technically difficult. Intravenous contrast (Definity) was administered. - Left ventricle: The cavity size was normal. There was mild concentric hypertrophy. Systolic function was vigorous. The estimated ejection fraction was in the range of 65% to 70%.  Wall motion was normal; there were no regional wall motion abnormalities. Doppler parameters are consistent with abnormal left ventricular relaxation (grade 1 diastolic dysfunction). _____________    ASSESSMENT & PLAN:   Dyspnea: with recent admission and sinus  tachycardia, we got CTA in office to rule out PE, which was negative. She does not appear volume overloaded today, but will recheck 2D ECHO to make sure LV function still preserved. If all this is okay, will plan to stop Brilnta and convert to plavix to see if SOB improves.   CAD s/p recent Inferior STEMI due to stent thrombosis within the proximal RCA -Started on DAPT with ASA and Brilinta. Will need to be on DAPT indefinitely. -Continue Zetia and statin. Previous allergy to BB  -Cardiac rehab, if she wishes to pursue this.   HTN: BP well controlled on current regimen. Review of BP log from home shows that her BP has been soft SBP: 90-110s.   HLD: LDL 156. Goal LDL <70. Started on Crestor 20mg  daily. Continue Zetia 10 mg daily. Recheck lipids in several months  Chronic Hypokalemia: continue K supplementation.    Medication Adjustments/Labs and Tests Ordered: Current medicines are reviewed at length with the patient today.  Concerns regarding medicines are outlined above.  Medication changes, Labs and Tests ordered today are listed in the Patient Instructions below. Patient Instructions  Medication Instructions:  Your physician recommends that you continue on your current medications as directed. Please refer to the Current Medication list given to you today.    Labwork: None ordered  Testing/Procedures: Non-Cardiac CT Angiography (CTA) (HAD DONE TODAY)  is a special type of CT scan that uses a computer to produce multi-dimensional views of major blood vessels throughout the body. In CT angiography, a contrast material is injected through an IV to help visualize the blood vessels  Your physician has requested that  you have an echocardiogram (1ST AVAILABLE)  Echocardiography is a painless test that uses sound waves to create images of your heart. It provides your doctor with information about the size and shape of your heart and how well your heart's chambers and valves are working. This procedure takes approximately one hour. There are no restrictions for this procedure.   Follow-Up: Your physician recommends that you schedule a follow-up appointment in:  [place], PA-C   Any Other Special Instructions Will Be Listed Below (If Applicable). Echocardiogram An echocardiogram, or echocardiography, uses sound waves (ultrasound) to produce an image of your heart. The echocardiogram is simple, painless, obtained within a short period of time, and offers valuable information to your health care provider. The images from an echocardiogram can provide information such as:  Evidence of coronary artery disease (CAD).  Heart size.  Heart muscle function.  Heart valve function.  Aneurysm detection.  Evidence of a past heart attack.  Fluid buildup around the heart.  Heart muscle thickening.  Assess heart valve function. LET [facility] CARE PROVIDER KNOW ABOUT:  Any allergies you have.  All medicines you are taking, including vitamins, herbs, eye drops, creams, and over-the-counter medicines.  Previous problems you or members of your family have had with the use of anesthetics.  Any blood disorders you have.  Previous surgeries you have had.  Medical conditions you have.  Possibility of pregnancy, if this applies. BEFORE THE PROCEDURE  No special preparation is needed. Eat and drink normally.  PROCEDURE   In order to produce an image of your heart, gel will be applied to your chest and a wand-like tool (transducer) will be moved over your chest. The gel will help transmit the sound waves from the transducer. The sound waves will harmlessly bounce off your heart to allow the  heart images to  be captured in real-time motion. These images will then be recorded.  You may need an IV to receive a medicine that improves the quality of the pictures. AFTER THE PROCEDURE You may return to your normal schedule including diet, activities, and medicines, unless your health care provider tells you otherwise.   This information is not intended to replace advice given to you by your health care provider. Make sure you discuss any questions you have with your health care provider.   Document Released: 12/20/1999 Document Revised: 01/12/2014 Document Reviewed: 08/29/2012 Elsevier Interactive Patient Education [employer].     If you need a refill on your cardiac medications before your next appointment, please call your pharmacy.      Signed, Angelena Form, PA-C  08/21/2015 3:41 PM    [facility], [place], [place]  [address] Phone: [phone]; Fax: [phone]

## 2015-08-21 ENCOUNTER — Encounter: Payer: Self-pay | Admitting: *Deleted

## 2015-08-21 ENCOUNTER — Ambulatory Visit (INDEPENDENT_AMBULATORY_CARE_PROVIDER_SITE_OTHER)
Admission: RE | Admit: 2015-08-21 | Discharge: 2015-08-21 | Disposition: A | Discharge status: Home / Self Care | Payer: 59 | Source: Ambulatory Visit | Attending: Physician Assistant | Admitting: Physician Assistant

## 2015-08-21 ENCOUNTER — Encounter: Payer: Self-pay | Admitting: Physician Assistant

## 2015-08-21 ENCOUNTER — Ambulatory Visit (INDEPENDENT_AMBULATORY_CARE_PROVIDER_SITE_OTHER): Payer: 59 | Admitting: Physician Assistant

## 2015-08-21 VITALS — BP 124/78 | HR 105 | Ht 65.0 in | Wt 192.1 lb

## 2015-08-21 DIAGNOSIS — R0602 Shortness of breath: Secondary | ICD-10-CM | POA: Diagnosis not present

## 2015-08-21 DIAGNOSIS — I2510 Atherosclerotic heart disease of native coronary artery without angina pectoris: Secondary | ICD-10-CM | POA: Diagnosis not present

## 2015-08-21 DIAGNOSIS — I2111 ST elevation (STEMI) myocardial infarction involving right coronary artery: Secondary | ICD-10-CM

## 2015-08-21 DIAGNOSIS — I10 Essential (primary) hypertension: Secondary | ICD-10-CM

## 2015-08-21 DIAGNOSIS — R000 Tachycardia, unspecified: Secondary | ICD-10-CM | POA: Diagnosis not present

## 2015-08-21 DIAGNOSIS — E785 Hyperlipidemia, unspecified: Secondary | ICD-10-CM

## 2015-08-21 MED ORDER — IOPAMIDOL (ISOVUE-370) INJECTION 76%
80.0000 mL | Freq: Once | INTRAVENOUS | Status: AC | PRN
  Administered 2015-08-21: 80 mL via INTRAVENOUS

## 2015-08-21 NOTE — Progress Notes (Signed)
No, nothing to explain the tachycardia, which does seem to bother her. She wanted to know what we would do to help reduce her HR. Would you load her with plavix or just switch her to 75 mg daily? Thanks!

## 2015-08-21 NOTE — Patient Instructions (Addendum)
Medication Instructions:  Your physician recommends that you continue on your current medications as directed. Please refer to the Current Medication list given to you today.    Labwork: None ordered  Testing/Procedures: Non-Cardiac CT Angiography (CTA) (HAD DONE TODAY)  is a special type of CT scan that uses a computer to produce multi-dimensional views of major blood vessels throughout the body. In CT angiography, a contrast material is injected through an IV to help visualize the blood vessels  Your physician has requested that you have an echocardiogram (1ST AVAILABLE)  Echocardiography is a painless test that uses sound waves to create images of your heart. It provides your doctor with information about the size and shape of your heart and how well your heart's chambers and valves are working. This procedure takes approximately one hour. There are no restrictions for this procedure.   Follow-Up: Your physician recommends that you schedule a follow-up appointment in:  [place], PA-C   Any Other Special Instructions Will Be Listed Below (If Applicable). Echocardiogram An echocardiogram, or echocardiography, uses sound waves (ultrasound) to produce an image of your heart. The echocardiogram is simple, painless, obtained within a short period of time, and offers valuable information to your health care provider. The images from an echocardiogram can provide information such as:  Evidence of coronary artery disease (CAD).  Heart size.  Heart muscle function.  Heart valve function.  Aneurysm detection.  Evidence of a past heart attack.  Fluid buildup around the heart.  Heart muscle thickening.  Assess heart valve function. LET [facility] CARE PROVIDER KNOW ABOUT:  Any allergies you have.  All medicines you are taking, including vitamins, herbs, eye drops, creams, and over-the-counter medicines.  Previous problems you or members of your family have had  with the use of anesthetics.  Any blood disorders you have.  Previous surgeries you have had.  Medical conditions you have.  Possibility of pregnancy, if this applies. BEFORE THE PROCEDURE  No special preparation is needed. Eat and drink normally.  PROCEDURE   In order to produce an image of your heart, gel will be applied to your chest and a wand-like tool (transducer) will be moved over your chest. The gel will help transmit the sound waves from the transducer. The sound waves will harmlessly bounce off your heart to allow the heart images to be captured in real-time motion. These images will then be recorded.  You may need an IV to receive a medicine that improves the quality of the pictures. AFTER THE PROCEDURE You may return to your normal schedule including diet, activities, and medicines, unless your health care provider tells you otherwise.   This information is not intended to replace advice given to you by your health care provider. Make sure you discuss any questions you have with your health care provider.   Document Released: 12/20/1999 Document Revised: 01/12/2014 Document Reviewed: 08/29/2012 Elsevier Interactive Patient Education [employer].     If you need a refill on your cardiac medications before your next appointment, please call your pharmacy.

## 2015-08-21 NOTE — Progress Notes (Signed)
Thank you. I have no objection to Plavix. Had she been using a lot of albuterol to explain the worsening tachycardia?

## 2015-08-22 ENCOUNTER — Encounter (HOSPITAL_COMMUNITY): Payer: Self-pay | Admitting: Radiology

## 2015-08-22 DIAGNOSIS — B029 Zoster without complications: Secondary | ICD-10-CM | POA: Diagnosis not present

## 2015-08-22 DIAGNOSIS — I10 Essential (primary) hypertension: Secondary | ICD-10-CM | POA: Diagnosis not present

## 2015-08-22 DIAGNOSIS — E119 Type 2 diabetes mellitus without complications: Secondary | ICD-10-CM | POA: Diagnosis not present

## 2015-08-22 DIAGNOSIS — I2510 Atherosclerotic heart disease of native coronary artery without angina pectoris: Secondary | ICD-10-CM | POA: Diagnosis not present

## 2015-08-22 DIAGNOSIS — E782 Mixed hyperlipidemia: Secondary | ICD-10-CM | POA: Diagnosis not present

## 2015-08-22 NOTE — Progress Notes (Signed)
Called patient to schedule echo with me on Monday

## 2015-08-23 ENCOUNTER — Other Ambulatory Visit: Payer: Self-pay | Admitting: Pharmacist

## 2015-08-23 ENCOUNTER — Encounter: Payer: Self-pay | Admitting: Physician Assistant

## 2015-08-23 ENCOUNTER — Other Ambulatory Visit: Payer: Self-pay | Admitting: Physician Assistant

## 2015-08-23 ENCOUNTER — Telehealth: Payer: Self-pay | Admitting: Physician Assistant

## 2015-08-23 MED ORDER — CLOPIDOGREL BISULFATE 75 MG PO TABS: 75.0000 mg | ORAL_TABLET | Freq: Every day | ORAL | 9 refills | Status: DC

## 2015-08-23 MED ORDER — CLOPIDOGREL BISULFATE 300 MG PO TABS: 300.0000 mg | ORAL_TABLET | Freq: Every day | ORAL | 0 refills | Status: DC

## 2015-08-23 MED FILL — CLOPIDOGREL 75 MG TABLET: 75 | 30 days supply | Qty: 30 | Fill #0

## 2015-08-23 MED FILL — ASPIR-LOW EC 81 MG TABLET: 81 | 90 days supply | Qty: 90 | Fill #0

## 2015-08-23 MED FILL — KLOR-CON M10 TABLET: 10 | 90 days supply | Qty: 180 | Fill #0

## 2015-08-23 NOTE — Telephone Encounter (Signed)
    Messaged by pharmacist about whether the patient should be taking ASA as it was not on her med list. I called the patient who has been appropriately taking ASA and Brilitna. I don't know how it was removed off her med list , but will have it put back on.  Angelena Form PA-C  [facility]

## 2015-08-23 NOTE — Telephone Encounter (Signed)
    Patient having SOB and we think it may be related to [place]. Will plan to stop Brilinta and load her with plavix 300mg  x1 followed by 75mg  daily. This has been called into get pharmacy and she will start on Monday 08/26/15 since pharmacy is closed over the weekend.  Angelena Form PA-C  [facility]

## 2015-08-26 ENCOUNTER — Other Ambulatory Visit: Payer: Self-pay

## 2015-08-26 ENCOUNTER — Other Ambulatory Visit: Payer: Self-pay | Admitting: Pharmacist

## 2015-08-26 ENCOUNTER — Encounter (HOSPITAL_COMMUNITY): Payer: Self-pay | Admitting: Radiology

## 2015-08-26 ENCOUNTER — Ambulatory Visit (HOSPITAL_COMMUNITY): Payer: 59 | Attending: Cardiovascular Disease

## 2015-08-26 DIAGNOSIS — I2510 Atherosclerotic heart disease of native coronary artery without angina pectoris: Secondary | ICD-10-CM | POA: Insufficient documentation

## 2015-08-26 DIAGNOSIS — I10 Essential (primary) hypertension: Secondary | ICD-10-CM | POA: Diagnosis not present

## 2015-08-26 DIAGNOSIS — E669 Obesity, unspecified: Secondary | ICD-10-CM | POA: Diagnosis not present

## 2015-08-26 DIAGNOSIS — Z6832 Body mass index (BMI) 32.0-32.9, adult: Secondary | ICD-10-CM | POA: Insufficient documentation

## 2015-08-26 DIAGNOSIS — E785 Hyperlipidemia, unspecified: Secondary | ICD-10-CM | POA: Insufficient documentation

## 2015-08-26 DIAGNOSIS — R0602 Shortness of breath: Secondary | ICD-10-CM | POA: Diagnosis not present

## 2015-08-26 DIAGNOSIS — E119 Type 2 diabetes mellitus without complications: Secondary | ICD-10-CM | POA: Insufficient documentation

## 2015-08-28 ENCOUNTER — Other Ambulatory Visit (HOSPITAL_COMMUNITY): Payer: 59

## 2015-08-29 ENCOUNTER — Telehealth: Payer: Self-pay | Admitting: Physician Assistant

## 2015-08-29 NOTE — Telephone Encounter (Signed)
Patient aware FMLA is completed I have mailed this to her home address per patient request.

## 2015-08-30 MED FILL — ARMOUR THYROID 60 MG TABLET: 60 | 30 days supply | Qty: 34 | Fill #0

## 2015-09-05 NOTE — Progress Notes (Signed)
Cardiology Office Note    Date:  09/06/2015   ID:  Shelley [name], DOB March 30, 1955, MRN [id number]  PCP:  [name], MD  Cardiologist:  Dr. Sallyanne Kuster  CC: follow up   History of Present Illness:  Shelley [name] is a 60 y.o. female with a history of CAD s/p multiple previous PCI, DM, HTN, and obesity and recent STEMI s/p DES to RCA with staged PCI/DES to distal RCA who presents to clinic for follow up.  She initially presented to [facility] on 07/31/15 with c/o chest pain and left arm pain. EKG with ST elevation in the inferior leads c/w acute STEMI. Code STEMI was called. She was transferred to [facility] for emergent cardiac catheterization. She has a rich history of coronary disease. In 2001, when she was only 61 years old she received a bare-metal stent to the mid LAD artery. In 2008 she presented with an acute myocardial infarction and received a 3 x 24 mm drug-eluting Taxus stent to the LAD artery. Subsequently underwent staged revascularization with a drug-eluting Cypher 2.5 x 13 mm to the distal LAD artery and a 3.0 x 23 mm drug-eluting Cypher stent to the right coronary artery. She had not required repeat coronary angiography since that time until this admission. Her most recent functional study in July of 2012 was negative for ischemia, showed normal left ventricular systolic function with an ejection fraction of 67%. She never had symptoms of congestive heart failure.   Emergent cardiac cath on 7/26 showed an occluded RCA s/p PCI/DES x 1. There was also 70-80% stenosis of the distal RCA. Case was reviewed by Drs. Smith and [place] and felt to be clinically significant. She underwent staged PCIDES x 1 to the distal RCA on 7/28 with 0% residual stenosis. She was advised to continue lifelong DAPT. She was started on aspirin 81 mg and Brilinta 90 mg bid. Echo on 7/27 showed a normal EF at 65-70%, no RWMA, GR1DD. She was noted to be hypotensive on the morning of 7/28 leading to  temporarily holding her antihypertensives. She has known intolerance to metoprolol. She was started on Coreg, though was changed back to Lopressor at time of discharge given its selective properties, but then never prescribed due to patient refusing. She was resumed on HCTZ with KCl supplementation given her long history of hypokalemia.  I saw her 08/21/15 for post hosp follow up. She was tachycardic and SOB. CTA negative for PE and 2D ECHO stable. She had a vesicular rash c/w herpes zoster. We switched her Brilinta to plavix 75 mg daily with 300mg  loag x1.   Today she presents to clinic for follow up. Her breathing is a lot better and she is feeling a lot better. Her HR is still high but she is not as bothered by it. No chest pain. She has been walking about 15 minutes a day with no chest pain or SOB. She is open to trying a BB and said she did okay on Coreg in the hospital. She previously had a cough on Lopressor. No LE edema, orthopnea or PND. No palpitations, dizziness or syncope.    Past Medical History:  Diagnosis Date  . CAD (coronary artery disease)   . Cardiac arrhythmia   . DES exposure in utero   . Diabetes mellitus   . Dyslipidemia   . Hypertension   . MI (myocardial infarction) ([place])   . Thyroid disease     Past Surgical History:  Procedure Laterality Date  .  BACK SURGERY  5/99 & 6/99  . CARDIAC CATHETERIZATION  01/12/2002   stent patent  . CARDIAC CATHETERIZATION  09/03/2006   patent stents  . CARDIAC CATHETERIZATION N/A 07/31/2015   Procedure: Left Heart Cath and Coronary Angiography;  Surgeon: Burnell Blanks, MD;  Location: [place] [facility];  Service: Cardiovascular;  Laterality: N/A;  . CARDIAC CATHETERIZATION N/A 07/31/2015   Procedure: Coronary Stent Intervention;  Surgeon: Burnell Blanks, MD;  Location: [place] [facility];  Service: Cardiovascular;  Laterality: N/A;  . CARDIAC CATHETERIZATION N/A 08/02/2015   Procedure: Coronary Stent Intervention;   Surgeon: Leonie Man, MD;  Location: [place] [facility];  Service: Cardiovascular;  Laterality: N/A;  . CORONARY ANGIOPLASTY WITH STENT PLACEMENT  07/17/1998   stent LAD  . CORONARY ANGIOPLASTY WITH STENT PLACEMENT  01/07/2006   stent RCA,LAD stent patent  . CORONARY ANGIOPLASTY WITH STENT PLACEMENT  03/18/2006   stent to mid RCA,prox.RCA and LAD stent patent  . CRYOTHERAPY    . THYROID SURGERY     PT. DENIES/RADIATION  . TUBAL LIGATION      Current Medications: Outpatient Medications Prior to Visit  Medication Sig Dispense Refill  . albuterol (PROAIR HFA) 108 (90 BASE) MCG/ACT inhaler Inhale 2 puffs into the lungs every 6 (six) hours as needed. For coughing and/or wheezing    . aspirin EC 81 MG tablet Take 81 mg by mouth daily.    . canagliflozin (INVOKANA) 100 MG TABS tablet Take 100 mg by mouth daily before breakfast.    . clopidogrel (PLAVIX) 75 MG tablet Take 1 tablet (75 mg total) by mouth daily. 30 tablet 9  . CRESTOR 20 MG tablet TAKE 1 TABLET BY MOUTH DAILY. 30 tablet 6  . ezetimibe (ZETIA) 10 MG tablet TAKE 1 TABLET (10 MG TOTAL) BY MOUTH DAILY. 30 tablet 11  . fexofenadine (ALLEGRA) 180 MG tablet Take 180 mg by mouth daily.     . potassium chloride (K-DUR) 10 MEQ tablet Take 2 tablets (20 mEq total) by mouth daily. 60 tablet 11  . TAZTIA XT 180 MG 24 hr capsule TAKE 2 CAPSULES BY MOUTH DAILY. 60 capsule 11  . thyroid (ARMOUR) 60 MG tablet Take 60 mg by mouth every morning. Take one 60mg  tablet Friday-Saturday. Sunday 120 mcg.    . clopidogrel (PLAVIX) 300 MG TABS tablet Take 1 tablet (300 mg total) by mouth daily. 1 tablet 0  . thyroid (ARMOUR) 15 MG tablet Take 15 mg by mouth every morning. Take one tablet with one 60mg  tablet for every morning before breakfast for a total 75mg  days Monday-Thursday    . ticagrelor (BRILINTA) 90 MG TABS tablet Take 1 tablet (90 mg total) by mouth 2 (two) times daily. 60 tablet 0  . valsartan-hydrochlorothiazide (DIOVAN-HCT) 160-12.5 MG per  tablet TAKE 1 TABLET BY MOUTH DAILY 90 tablet 3   No facility-administered medications prior to visit.      Allergies:   Levothyroxine; Lopressor [metoprolol tartrate]; Metformin and related; and Norvasc [amlodipine besylate]   Social History   Social History  . Marital status: Married    Spouse name: N/A  . Number of children: N/A  . Years of education: N/A   Occupational History  . EKG [employer]   Social History Main Topics  . Smoking status: Former Research scientist (life sciences)  . Smokeless tobacco: Former Systems developer    Quit date: 01/05/2005  . Alcohol use No  . Drug use: No  . Sexual activity: Yes  Partners: Male   Other Topics Concern  . Not on file   Social History Narrative  . No narrative on file     Family History:  The patient's family history includes Esophageal cancer in her maternal aunt; Hypertension in her mother; Prostate cancer in her maternal uncle; Thyroid disease in her mother.     ROS:   Please see the history of present illness.    ROS All other systems reviewed and are negative.   PHYSICAL EXAM:   VS:  BP 120/64   Pulse (!) 107   Ht 5\' 5"  (1.651 m)   Wt 191 lb (86.6 kg)   SpO2 92%   BMI 31.78 kg/m    GEN: Well nourished, well developed, in no acute distress  HEENT: normal  Neck: no JVD, carotid bruits, or masses Cardiac: tachy; no murmurs, rubs, or gallops,no edema  Respiratory:  clear to auscultation bilaterally, normal work of breathing GI: soft, nontender, nondistended, + BS MS: no deformity or atrophy  Skin: warm and dry, vesicular rash on L forarm Neuro:  Alert and Oriented x 3, Strength and sensation are intact Psych: euthymic mood, full affect  Wt Readings from Last 3 Encounters:  09/06/15 191 lb (86.6 kg)  08/21/15 192 lb 1.9 oz (87.1 kg)  08/03/15 200 lb 9.9 oz (91 kg)      Studies/Labs Reviewed:   EKG:  EKG is ordered today.  The ekg ordered today demonstrates  Sinus tachy HR 105  Recent Labs: 08/03/2015: BUN 9; Creatinine, Ser 0.89;  Hemoglobin 16.1; Platelets 281; Potassium 3.5; Sodium 137   Lipid Panel    Component Value Date/Time   CHOL 209 (H) 08/03/2015 0450   TRIG 105 08/03/2015 0450   HDL 32 (L) 08/03/2015 0450   CHOLHDL 6.5 08/03/2015 0450   VLDL 21 08/03/2015 0450   LDLCALC 156 (H) 08/03/2015 0450    Additional studies/ records that were reviewed today include:  Cardiac cath 07/31/2015: Conclusion   Prox RCA to Mid RCA lesion, 10 %stenosed.  Prox LAD to Mid LAD lesion, 10 %stenosed.  A drug eluting stent was successfully placed, and overlaps previously placed stent.  Prox RCA lesion, 100 %stenosed.  Post intervention, there is a 0% residual stenosis.  The left ventricular ejection fraction is 55-65% by visual estimate.  The left ventricular systolic function is normal.  LV end diastolic pressure is normal.  Dist RCA lesion, 70 %stenosed.   1. Acute inferior STEMI secondary to occluded proximal RCA 2. Successful PTCA/DES x 1 proximal RCA 3. There is a 70% stenosis in the distal RCA 4. Patent stents in the LAD with minimal restenosis.  5. Preserved LV systolic function with subtle hypokinesis of the inferior wall Recommendations: Will admit to ICU. Echo in am. Will continue ASA/Brilinta/statin. She is intolerant of beta blockers. Will hold anti-hypertensives tonight. Restart in am if BP stable. She may need need staged PCI of the distal RCA. Will review with interventional team in the am.      Cardiac cath 08/02/2015: Conclusion    Dist RCA lesion, 80 %stenosed. - for planned PCI.  A STENT PROMUS PREM MR 3.0X16 drug eluting stent was successfully placed, and does not overlap previously placed stent.  Post intervention, there is a 0% residual stenosis.  Recent Prox RCA Promus Premier DES stent, 0 %stenosed.  Prox RCA to Mid RCA lesion, 10 %stenosed - in-stent restenosis Successful distal RCA PCI with a single DES stent with excellent result.     Echo  08/01/2015: Study  Conclusions - Procedure narrative: Transthoracic echocardiography. Image quality was adequate. The study was technically difficult. Intravenous contrast (Definity) was administered. - Left ventricle: The cavity size was normal. There was mild concentric hypertrophy. Systolic function was vigorous. The estimated ejection fraction was in the range of 65% to 70%. Wall motion was normal; there were no regional wall motion abnormalities. Doppler parameters are consistent with abnormal left ventricular relaxation (grade 1 diastolic dysfunction). _____________  2D ECHO 08/26/15 LV EF: 60% -   65% Study Conclusions - Left ventricle: The cavity size was normal. Systolic function was   normal. The estimated ejection fraction was in the range of 60%   to 65%. Wall motion was normal; there were no regional wall   motion abnormalities. Doppler parameters are consistent with   abnormal left ventricular relaxation (grade 1 diastolic   dysfunction). - Atrial septum: No defect or patent foramen ovale was identified.   ASSESSMENT & PLAN:   Dyspnea: with recent admission and sinus tachycardia, we got CTA in office to rule out PE, which was negative. Repeat 2D ECHO stable with normal LVF. We switched her Brilinta to plavix 75mg  daily with a 300mg  load which has helped her dyspnea tremendously.   CAD s/p recent Inferior STEMI due to stent thrombosis within the proximal RCA -Started on DAPT with ASA and Brilinta. Will need to be on DAPT indefinitely. This was converted to plavix due to dyspnea caused by Brilinta. -Continue Zetia and statin. Previous allergy to BB but willing to try Coreg 3.125mg  BID -Cardiac rehab, if she wishes to pursue this.   HTN: BP well controlled on current regimen.    HLD: LDL 156. Goal LDL <70. Started on Crestor 20mg  daily. Continue Zetia 10 mg daily. Recheck lipids in several months  Chronic Hypokalemia: continue K supplementation.   Sinus tachycardia: she  has been on Taztia 180mg  BID. She does not tolerate Lopressor. Will try Coreg 3.125mg  BID.  Medication Adjustments/Labs and Tests Ordered: Current medicines are reviewed at length with the patient today.  Concerns regarding medicines are outlined above.  Medication changes, Labs and Tests ordered today are listed in the Patient Instructions below. Patient Instructions  Medication Instructions:  Your physician has recommended you make the following change in your medication:  1.  START Coreg 3.125 taking 1 tablet twice a day    Labwork: None ordered  Testing/Procedures: None ordered  Follow-Up: Your physician recommends that you schedule a follow-up appointment in: 2-3 [place]   Any Other Special Instructions Will Be Listed Below (If Applicable).     If you need a refill on your cardiac medications before your next appointment, please call your pharmacy.      Signed, Angelena Form, PA-C  09/06/2015 10:16 AM    [facility] [address], [place], [place]  [address] Phone: [phone]; Fax: [phone]

## 2015-09-06 ENCOUNTER — Ambulatory Visit (INDEPENDENT_AMBULATORY_CARE_PROVIDER_SITE_OTHER): Payer: 59 | Admitting: Physician Assistant

## 2015-09-06 ENCOUNTER — Encounter: Payer: Self-pay | Admitting: *Deleted

## 2015-09-06 VITALS — BP 120/64 | HR 107 | Ht 65.0 in | Wt 191.0 lb

## 2015-09-06 DIAGNOSIS — E785 Hyperlipidemia, unspecified: Secondary | ICD-10-CM

## 2015-09-06 DIAGNOSIS — I10 Essential (primary) hypertension: Secondary | ICD-10-CM | POA: Diagnosis not present

## 2015-09-06 DIAGNOSIS — I2111 ST elevation (STEMI) myocardial infarction involving right coronary artery: Secondary | ICD-10-CM | POA: Diagnosis not present

## 2015-09-06 DIAGNOSIS — R000 Tachycardia, unspecified: Secondary | ICD-10-CM | POA: Diagnosis not present

## 2015-09-06 DIAGNOSIS — E876 Hypokalemia: Secondary | ICD-10-CM

## 2015-09-06 MED ORDER — CARVEDILOL 3.125 MG PO TABS: 3.1250 mg | ORAL_TABLET | Freq: Two times a day (BID) | ORAL | 3 refills | Status: DC

## 2015-09-06 MED FILL — CARVEDILOL 3.125 MG TABLET: 3.125 | 90 days supply | Qty: 180 | Fill #0

## 2015-09-06 MED FILL — INVOKANA 100 MG TABLET: 100 | 90 days supply | Qty: 90 | Fill #5

## 2015-09-06 NOTE — Patient Instructions (Signed)
Medication Instructions:  Your physician has recommended you make the following change in your medication:  1.  START Coreg 3.125 taking 1 tablet twice a day    Labwork: None ordered  Testing/Procedures: None ordered  Follow-Up: Your physician recommends that you schedule a follow-up appointment in: 2-3 [place]   Any Other Special Instructions Will Be Listed Below (If Applicable).     If you need a refill on your cardiac medications before your next appointment, please call your pharmacy.

## 2015-09-16 ENCOUNTER — Other Ambulatory Visit: Payer: Self-pay | Admitting: Pharmacist

## 2015-09-16 NOTE — Patient Outreach (Signed)
[facility]) Care Management  09/16/2015  Shelley [name] 06-13-1955 [id number]  Scheduled appointment for Link to Wellness follow up for diabetes. Will send patient a reminder email.   [name], Pharm.D.

## 2015-09-23 DIAGNOSIS — E785 Hyperlipidemia, unspecified: Secondary | ICD-10-CM | POA: Diagnosis not present

## 2015-09-23 DIAGNOSIS — I252 Old myocardial infarction: Secondary | ICD-10-CM | POA: Diagnosis not present

## 2015-09-26 MED FILL — CLOPIDOGREL 75 MG TABLET: 75 | 90 days supply | Qty: 90 | Fill #1

## 2015-09-26 MED FILL — ARMOUR THYROID 60 MG TABLET: 60 | 90 days supply | Qty: 102 | Fill #1

## 2015-09-26 MED FILL — ROSUVASTATIN CALCIUM 40 MG: 40 | 90 days supply | Qty: 90 | Fill #0

## 2015-10-01 ENCOUNTER — Telehealth: Payer: Self-pay | Admitting: Cardiovascular Disease

## 2015-10-01 NOTE — Telephone Encounter (Signed)
Patient mailed in [place] completed form. Patient aware and have mailed to patients home per her request.

## 2015-10-07 ENCOUNTER — Other Ambulatory Visit: Payer: Self-pay | Admitting: Pharmacist

## 2015-10-07 ENCOUNTER — Encounter: Payer: Self-pay | Admitting: Pharmacist

## 2015-10-07 NOTE — Patient Outreach (Signed)
Shelley [name]) Care Management  [place]   10/07/2015  Shelley [name] 1955-09-27 [id number]  Subjective: Patient presents today for 3 month diabetes follow-up as part of the employer-sponsored Link to Wellness program.  Current diabetes regimen includes invokana 100 mg daily.  Patient also continues on daily aspirin and rosuvastatin.  Patient has a pending appt for December with PCP and cardiologist.  No med changes with diabetes however patient has had MI (07/31/15) and carvedilol was added.   Patient reported dietary habits: Eats 2-3 meals/day Breakfast: belvita bars, glucerna, oatmeal Lunch: pork loin, baked beans, potatoes, salads sometimes Dinner: beef ribs, mixed vegetables, apple cobbler (splurge) Snacks: belvita bars, granola bar, banana, peaches Drinks: water, sweet tea occasionally   Patient reported exercise habits: walking at work  Patient denies hypoglycemic events.  Patient denies nocturia  Patient denies pain/burning upon urination.  Patient denies neuropathy. Patient denies visual changes. Patient reports self foot exams. Daily.  Patient reported self monitored blood glucose frequency - daily Home fasting CBG: 100-120s  Objective:  Lab Results  Component Value Date   HGBA1C (H) 09/03/2006    6.3 (NOTE)   The ADA recommends the following therapeutic goals for glycemic   control related to Hgb A1C measurement:   Goal of Therapy:   < 7.0% Hgb A1C   Action Suggested:  > 8.0% Hgb A1C   Ref:  Diabetes Care, 22, Suppl. 1, 1999  Of note, patient's PCP does not use Epic per pt. She reports her last A1C was 6.5.   Vitals:   10/07/15 1546  BP: 114/79  Pulse: 84    Lipid Panel     Component Value Date/Time   CHOL 209 (H) 08/03/2015 0450   TRIG 105 08/03/2015 0450   HDL 32 (L) 08/03/2015 0450   CHOLHDL 6.5 08/03/2015 0450   VLDL 21 08/03/2015 0450   LDLCALC 156 (H) 08/03/2015 0450    10 year ASCVD risk: 15.8%  Encounter  Medications: Outpatient Encounter Prescriptions as of 10/07/2015  Medication Sig  . albuterol (PROAIR HFA) 108 (90 BASE) MCG/ACT inhaler Inhale 2 puffs into the lungs every 6 (six) hours as needed. For coughing and/or wheezing  . aspirin EC 81 MG tablet Take 81 mg by mouth daily.  . canagliflozin (INVOKANA) 100 MG TABS tablet Take 100 mg by mouth daily before breakfast.  . carvedilol (COREG) 3.125 MG tablet Take 1 tablet (3.125 mg total) by mouth 2 (two) times daily.  . clopidogrel (PLAVIX) 75 MG tablet Take 1 tablet (75 mg total) by mouth daily.  . CRESTOR 20 MG tablet TAKE 1 TABLET BY MOUTH DAILY.  Marland Kitchen ezetimibe (ZETIA) 10 MG tablet TAKE 1 TABLET (10 MG TOTAL) BY MOUTH DAILY.  . fexofenadine (ALLEGRA) 180 MG tablet Take 180 mg by mouth daily.   . potassium chloride (K-DUR) 10 MEQ tablet Take 2 tablets (20 mEq total) by mouth daily. (Patient taking differently: Take 10 mEq by mouth 2 (two) times daily. )  . TAZTIA XT 180 MG 24 hr capsule TAKE 2 CAPSULES BY MOUTH DAILY.  Marland Kitchen thyroid (ARMOUR) 60 MG tablet Take 60 mg by mouth every morning. Take one 60mg  tablet Monday-Saturday. Sunday 120 mcg.  . valACYclovir (VALTREX) 500 MG tablet Take 500 mg by mouth 2 (two) times daily.  . valsartan-hydrochlorothiazide (DIOVAN-HCT) 160-12.5 MG tablet Take 1 tablet by mouth daily.   No facility-administered encounter medications on file as of 10/07/2015.     Functional Status: In your present state of health,  do you have any difficulty performing the following activities: 10/07/2015 07/31/2015  Hearing? N N  Vision? N N  Difficulty concentrating or making decisions? N N  Walking or climbing stairs? N N  Dressing or bathing? N N  Doing errands, shopping? N N  Some recent data might be hidden    Fall/Depression Screening: PHQ 2/9 Scores 10/07/2015 08/14/2014 06/25/2014  PHQ - 2 Score 0 0 0     Assessment:  Diabetes: Controlled as evidenced by most recent A1C of 6.5 % which is less than goal of less than 7%.  Weight is stable since last visit with me.   Lifestyle improvements: No change since last visit, has continued to try and work on diet  Physical Activity- Lacks formal exercise plan as she has no formal exercise. Of note, her job requires walking and she bowls x2/week.  Nutrition- Needs improvement however patient is making modifications to control T2DM.    Plan/Goals for Next Visit:  [name] CM Care Plan Problem One   Flowsheet Row Most Recent Value  Care Plan Problem One  Cardiac Rehabilitation  Role Documenting the Problem One  Clinical Pharmacist  Care Plan for Problem One  Active  [facility] Long Term Goal (31-90 days)  Patient will increase endurance as evidenced by ability to walk without stopping as tolerated over the next 90 days  [facility]N Long Term Goal Start Date  10/07/15  Interventions for Problem One Long Term Goal  discussed with patient ideas to increase walking (before bowling, after work)    [place] Problem Two   Flowsheet Row Most Recent Value  Care Plan Problem Two  Diabetes management  Role Documenting the Problem Two  Clinical Pharmacist  Care Plan for Problem Two  Active  Interventions for Problem Two Long Term Goal   Discussed role of diet and exercise in glucose control  [facility]N Long Term Goal (31-90) days  Patient will improve blood glucose control as evidenced by A1c by decreasing intake of sweet tea and desserts over the next 90 days  [facility] Long Term Goal Start Date  10/07/15     Next appointment to see me is: in 3 months via telephone  Shelley [name], Pharm.D. PGY1 Pharmacy Resident 10/2/20175:08 PM Pager [phone]

## 2015-10-16 MED FILL — EZETIMIBE 10 MG TABLET: 10 | 30 days supply | Qty: 30 | Fill #1

## 2015-10-30 DIAGNOSIS — R768 Other specified abnormal immunological findings in serum: Secondary | ICD-10-CM | POA: Diagnosis not present

## 2015-11-06 MED FILL — TRUE METRIX GLUCOSE TEST ST: 90 days supply | Qty: 200 | Fill #1

## 2015-11-18 MED FILL — EZETIMIBE 10 MG TABLET: 10 | 30 days supply | Qty: 30 | Fill #2

## 2015-11-27 ENCOUNTER — Encounter: Payer: Self-pay | Admitting: Obstetrics and Gynecology

## 2015-11-27 ENCOUNTER — Ambulatory Visit (INDEPENDENT_AMBULATORY_CARE_PROVIDER_SITE_OTHER): Payer: 59 | Admitting: Obstetrics and Gynecology

## 2015-11-27 VITALS — BP 122/60 | HR 76 | Resp 16 | Ht 64.0 in | Wt 193.0 lb

## 2015-11-27 DIAGNOSIS — N8110 Cystocele, unspecified: Secondary | ICD-10-CM | POA: Diagnosis not present

## 2015-11-27 DIAGNOSIS — N952 Postmenopausal atrophic vaginitis: Secondary | ICD-10-CM | POA: Diagnosis not present

## 2015-11-27 DIAGNOSIS — Z124 Encounter for screening for malignant neoplasm of cervix: Secondary | ICD-10-CM | POA: Diagnosis not present

## 2015-11-27 DIAGNOSIS — N762 Acute vulvitis: Secondary | ICD-10-CM

## 2015-11-27 DIAGNOSIS — Z01419 Encounter for gynecological examination (general) (routine) without abnormal findings: Secondary | ICD-10-CM

## 2015-11-27 DIAGNOSIS — Z9189 Other specified personal risk factors, not elsewhere classified: Secondary | ICD-10-CM

## 2015-11-27 DIAGNOSIS — Z0000 Encounter for general adult medical examination without abnormal findings: Secondary | ICD-10-CM | POA: Diagnosis not present

## 2015-11-27 LAB — POC HEMOCCULT BLD/STL (OFFICE/1-CARD/DIAGNOSTIC): FECAL OCCULT BLD: NEGATIVE

## 2015-11-27 MED ORDER — BETAMETHASONE VALERATE 0.1 % EX OINT: TOPICAL_OINTMENT | CUTANEOUS | 0 refills | Status: DC

## 2015-11-27 MED FILL — BETAMETHASONE VALER 0.1% OI: 0.1 | 14 days supply | Qty: 15 | Fill #0

## 2015-11-27 NOTE — Progress Notes (Signed)
60 y.o. G1P1001 MarriedAfrican AmericanF here for annual exam.  She c/o vulvar itching for the last week, no abnormal d/c, worried about yeast. Sexually active, infrequently, uncomfortable, not using a lubricant. No vaginal bleeding.  She had a MI in July, had to get 2 stents. Got shingles after that. She was out of work until September. Feeling better.  Her diabetes is okay. Fasting FS 85-130.  HgbA1C around 6.5%. Endocrinology told her if she looses 10 lbs she may be able to come off her medication.  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     Patient's last menstrual period was 01/06/2003 (approximate).          Sexually active: Yes.    The current method of family planning is post menopausal status.    Exercising: Yes.    Walking Smoker:  no  Health Maintenance: Pap:  11/22/14 neg. HR HPV:neg  History of abnormal Pap:  Yes MMG: unsure. Will schedule this year  Colonoscopy:  Never BMD:   Never TDaP:  UTD    reports that she has quit smoking. She quit smokeless tobacco use about 10 years ago. She reports that she does not drink alcohol or use drugs. 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 One child, 58 year old son, no grandchildren.  Past Medical History:  Diagnosis Date  . CAD (coronary artery disease)   . Cardiac arrhythmia   . DES exposure in utero   . Diabetes mellitus   . Dyslipidemia   . Hypertension   . MI (myocardial infarction)   . Shingles   . Thyroid disease     Past Surgical History:  Procedure Laterality Date  . BACK SURGERY  5/99 & 6/99  . CARDIAC CATHETERIZATION  01/12/2002   stent patent  . CARDIAC CATHETERIZATION  09/03/2006   patent stents  . CARDIAC CATHETERIZATION N/A 07/31/2015   Procedure: Left Heart Cath and Coronary Angiography;  Surgeon: Burnell Blanks, MD;  Location: [place] [facility];  Service: Cardiovascular;  Laterality: N/A;  . CARDIAC CATHETERIZATION N/A 07/31/2015   Procedure: Coronary Stent Intervention;  Surgeon: Burnell Blanks, MD;   Location: [place] [facility];  Service: Cardiovascular;  Laterality: N/A;  . CARDIAC CATHETERIZATION N/A 08/02/2015   Procedure: Coronary Stent Intervention;  Surgeon: Leonie Man, MD;  Location: [place] [facility];  Service: Cardiovascular;  Laterality: N/A;  . CORONARY ANGIOPLASTY WITH STENT PLACEMENT  07/17/1998   stent LAD  . CORONARY ANGIOPLASTY WITH STENT PLACEMENT  01/07/2006   stent RCA,LAD stent patent  . CORONARY ANGIOPLASTY WITH STENT PLACEMENT  03/18/2006   stent to mid RCA,prox.RCA and LAD stent patent  . CRYOTHERAPY    . THYROID SURGERY     PT. DENIES/RADIATION  . TUBAL LIGATION      Current Outpatient Prescriptions  Medication Sig Dispense Refill  . albuterol (PROAIR HFA) 108 (90 BASE) MCG/ACT inhaler Inhale 2 puffs into the lungs every 6 (six) hours as needed. For coughing and/or wheezing    . aspirin EC 81 MG tablet Take 81 mg by mouth daily.    . canagliflozin (INVOKANA) 100 MG TABS tablet Take 100 mg by mouth daily before breakfast.    . carvedilol (COREG) 3.125 MG tablet Take 1 tablet (3.125 mg total) by mouth 2 (two) times daily. 180 tablet 3  . clopidogrel (PLAVIX) 75 MG tablet Take 1 tablet (75 mg total) by mouth daily. 30 tablet 9  . CRESTOR 20 MG tablet TAKE 1 TABLET BY MOUTH  DAILY. 30 tablet 6  . ezetimibe (ZETIA) 10 MG tablet TAKE 1 TABLET (10 MG TOTAL) BY MOUTH DAILY. 30 tablet 11  . fexofenadine (ALLEGRA) 180 MG tablet Take 180 mg by mouth daily.     . potassium chloride (K-DUR) 10 MEQ tablet Take 2 tablets (20 mEq total) by mouth daily. (Patient taking differently: Take 10 mEq by mouth 2 (two) times daily. ) 60 tablet 11  . TAZTIA XT 180 MG 24 hr capsule TAKE 2 CAPSULES BY MOUTH DAILY. 60 capsule 11  . thyroid (ARMOUR) 60 MG tablet Take 60 mg by mouth every morning. Take one 60mg  tablet Monday-Saturday. Sunday 120 mcg.    . valsartan-hydrochlorothiazide (DIOVAN-HCT) 160-12.5 MG tablet Take 1 tablet by mouth daily.     No current facility-administered  medications for this visit.     Family History  Problem Relation Age of Onset  . Hypertension Mother   . Thyroid disease Mother   . Esophageal cancer Maternal Aunt   . Prostate cancer Maternal Uncle     Review of Systems  Constitutional: Negative.   HENT: Negative.   Eyes: Negative.   Respiratory: Negative.   Cardiovascular: Negative.   Gastrointestinal: Negative.   Endocrine: Negative.   Genitourinary: Negative.   Musculoskeletal: Negative.   Skin: Negative.   Allergic/Immunologic: Negative.   Neurological: Negative.   Hematological: Negative.   Psychiatric/Behavioral: Negative.     Exam:   BP 122/60 (BP Location: Right Arm, Patient Position: Sitting, Cuff Size: Normal)   Pulse 76   Resp 16   Ht 5\' 4"  (1.626 m)   Wt 193 lb (87.5 kg)   LMP 01/06/2003 (Approximate)   BMI 33.13 kg/m   Weight change: @WEIGHTCHANGE @ Height:   Height: 5\' 4"  (162.6 cm)  Ht Readings from Last 3 Encounters:  11/27/15 5\' 4"  (1.626 m)  09/06/15 5\' 5"  (1.651 m)  08/21/15 5\' 5"  (1.651 m)    General appearance: alert, cooperative and appears stated age Head: Normocephalic, without obvious abnormality, atraumatic Neck: no adenopathy, supple, symmetrical, trachea midline and thyroid normal to inspection and palpation Lungs: clear to auscultation bilaterally Breasts: normal appearance, no masses or tenderness Heart: regular rate and rhythm Abdomen: soft, non-tender; bowel sounds normal; no masses,  no organomegaly Extremities: extremities normal, atraumatic, no cyanosis or edema Skin: Skin color, texture, turgor normal. No rashes or lesions Lymph nodes: Cervical, supraclavicular, and axillary nodes normal. No abnormal inguinal nodes palpated Neurologic: Grossly normal   Pelvic: External genitalia:  no lesions, mild erythema              Urethra:  normal appearing urethra with no masses, tenderness or lesions              Bartholins and Skenes: normal                 Vagina: very atrophic  vagina, no discharge. Grade 2 cystocele with valsalva, no other significant prolapse (not symptomatic)              Cervix: no lesions               Bimanual Exam:  Uterus:  normal size, contour, position, consistency, mobility, non-tender              Adnexa: no mass, fullness, tenderness               Rectovaginal: Confirms               Anus:  normal sphincter tone, no  lesions  Chaperone was present for exam.  A:  Well Woman with normal exam  H/O DES exposure  Vulvitis  Vaginal atrophy  Cystocele, not bothersome   P:   Pap with reflex hpv  Labs with Cardiology and Endocrinology  Mammogram and colonoscopy encouraged  Discussed breast self exam  Discussed calcium and vit D intake  Wet prep probe (she has mychart, I will send her a message and a script if positive)  Will treat with steroid ointment  Use a lubricant with intercourse  Discussed using a vaginal moisturizer like replens, instead of douching

## 2015-11-27 NOTE — Patient Instructions (Signed)

## 2015-11-28 LAB — WET PREP BY MOLECULAR PROBE
Candida species: POSITIVE — AB
Gardnerella vaginalis: POSITIVE — AB
Trichomonas vaginosis: NEGATIVE

## 2015-11-29 ENCOUNTER — Other Ambulatory Visit: Payer: Self-pay | Admitting: Obstetrics and Gynecology

## 2015-11-29 MED ORDER — FLUCONAZOLE 150 MG PO TABS: 150.0000 mg | ORAL_TABLET | Freq: Once | ORAL | 0 refills | Status: AC

## 2015-11-29 MED ORDER — METRONIDAZOLE 500 MG PO TABS: 500.0000 mg | ORAL_TABLET | Freq: Two times a day (BID) | ORAL | 0 refills | Status: DC

## 2015-11-29 MED FILL — FLUCONAZOLE 150 MG TABLET: 150 | 4 days supply | Qty: 2 | Fill #0

## 2015-11-29 MED FILL — metroNIDAZOLE 500 MG TABS: 500 | 7 days supply | Qty: 14 | Fill #0

## 2015-11-29 MED FILL — ASPIR-LOW EC 81 MG TABLET: 81 | 90 days supply | Qty: 90 | Fill #1

## 2015-12-03 MED FILL — KLOR-CON M10 TABLET: 10 | 90 days supply | Qty: 180 | Fill #1

## 2015-12-03 MED FILL — CARVEDILOL 3.125 MG TABLET: 3.125 | 90 days supply | Qty: 180 | Fill #1

## 2015-12-03 MED FILL — INVOKANA 100 MG TABLET: 100 | 90 days supply | Qty: 90 | Fill #6

## 2015-12-06 LAB — IPS PAP TEST WITH REFLEX TO HPV

## 2015-12-10 DIAGNOSIS — E1165 Type 2 diabetes mellitus with hyperglycemia: Secondary | ICD-10-CM | POA: Diagnosis not present

## 2015-12-10 DIAGNOSIS — E7800 Pure hypercholesterolemia, unspecified: Secondary | ICD-10-CM | POA: Diagnosis not present

## 2015-12-10 DIAGNOSIS — E039 Hypothyroidism, unspecified: Secondary | ICD-10-CM | POA: Diagnosis not present

## 2015-12-17 DIAGNOSIS — E1165 Type 2 diabetes mellitus with hyperglycemia: Secondary | ICD-10-CM | POA: Diagnosis not present

## 2015-12-17 DIAGNOSIS — I10 Essential (primary) hypertension: Secondary | ICD-10-CM | POA: Diagnosis not present

## 2015-12-17 DIAGNOSIS — E7800 Pure hypercholesterolemia, unspecified: Secondary | ICD-10-CM | POA: Diagnosis not present

## 2015-12-17 DIAGNOSIS — E039 Hypothyroidism, unspecified: Secondary | ICD-10-CM | POA: Diagnosis not present

## 2015-12-17 MED FILL — EZETIMIBE 10 MG TABLET: 10 | 90 days supply | Qty: 90 | Fill #3

## 2015-12-19 ENCOUNTER — Encounter: Payer: Self-pay | Admitting: Cardiovascular Disease

## 2015-12-19 ENCOUNTER — Ambulatory Visit (INDEPENDENT_AMBULATORY_CARE_PROVIDER_SITE_OTHER): Payer: 59 | Admitting: Cardiovascular Disease

## 2015-12-19 VITALS — BP 124/72 | HR 82 | Ht 64.0 in | Wt 195.2 lb

## 2015-12-19 DIAGNOSIS — I2510 Atherosclerotic heart disease of native coronary artery without angina pectoris: Secondary | ICD-10-CM

## 2015-12-19 DIAGNOSIS — E1159 Type 2 diabetes mellitus with other circulatory complications: Secondary | ICD-10-CM

## 2015-12-19 DIAGNOSIS — E669 Obesity, unspecified: Secondary | ICD-10-CM

## 2015-12-19 DIAGNOSIS — E7800 Pure hypercholesterolemia, unspecified: Secondary | ICD-10-CM | POA: Diagnosis not present

## 2015-12-19 DIAGNOSIS — I10 Essential (primary) hypertension: Secondary | ICD-10-CM | POA: Diagnosis not present

## 2015-12-19 MED ORDER — DILTIAZEM HCL 120 MG PO TABS: 120.0000 mg | ORAL_TABLET | Freq: Three times a day (TID) | ORAL | 11 refills | Status: DC

## 2015-12-19 MED FILL — dilTIAZem HCL 120 MG TABS: 120 | 30 days supply | Qty: 90 | Fill #0

## 2015-12-19 NOTE — Progress Notes (Signed)
Patient ID: Shelley [name], female   DOB: 03-15-1955, 60 y.o.   MRN: [id number]    Cardiology Office Note    Date:  12/19/2015   ID:  Shelley [name], DOB 08/21/1955, MRN [id number]  PCP:  [name], MD  Cardiologist:   Sanda Klein, MD   Chief Complaint  Patient presents with  . Follow-up    History of Present Illness:  Shelley [name] is a 60 y.o. female with premature onset coronary artery disease, diabetes mellitus, hypertension and obesity who returns for follow-up. She feels well and denies any cardiovascular complaints.   Had an inferior wall STEMI on 07/31/2015 and received a drug-eluting stent to the distal RCA.  On August 16 had shingles. She has pruritus which she thinks is related to the packaging of the diltiazem sustained release capsules. She has had similar problems with capsules before. (She has actually been emptying the contents of the capsules and taking that twice a day, essentially taking immediate release diltiazem 180 mg twice daily).  Her most recent hemoglobin A1c was 6.6%. Her latest lipid profile shows good control with an LDL cholesterol of 53 (on combination rosuvastatin and Zetia).  Her electrocardiogram has always been slightly abnormal with varying degrees of T-wave inversion in the inferior and anterolateral leads. Last year these changes were less prominent, they are back again this year with findings very similar to 2014. There does not seem to be any connection between her symptoms and EKG abnormalities.  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 She has a rich history of coronary disease. In 2001, when she was only 60 years old she received a bare-metal stent to the mid LAD artery. In 2008 she presented with an acute myocardial infarction and received a 3 x 24 mm drug-eluting Taxus stent to the LAD artery. Subsequently underwent staged revascularization with a drug-eluting Cypher 2.5 x 13 mm to the distal LAD artery and a 3.0 x 23 mm  drug-eluting Cypher stent to the right coronary artery. July 31 2014 received drug-eluting stent to the distal RCA PROMUS PREM MR 3.0X16 for STEMI. She has normal left ventricular systolic function. She has never had symptoms of congestive heart failure.   Past Medical History:  Diagnosis Date  . CAD (coronary artery disease)   . Cardiac arrhythmia   . DES exposure in utero   . Diabetes mellitus   . Dyslipidemia   . Hypertension   . MI (myocardial infarction)   . Shingles   . Thyroid disease     Past Surgical History:  Procedure Laterality Date  . BACK SURGERY  5/99 & 6/99  . CARDIAC CATHETERIZATION  01/12/2002   stent patent  . CARDIAC CATHETERIZATION  09/03/2006   patent stents  . CARDIAC CATHETERIZATION N/A 07/31/2015   Procedure: Left Heart Cath and Coronary Angiography;  Surgeon: Burnell Blanks, MD;  Location: [place] [facility];  Service: Cardiovascular;  Laterality: N/A;  . CARDIAC CATHETERIZATION N/A 07/31/2015   Procedure: Coronary Stent Intervention;  Surgeon: Burnell Blanks, MD;  Location: [place] [facility];  Service: Cardiovascular;  Laterality: N/A;  . CARDIAC CATHETERIZATION N/A 08/02/2015   Procedure: Coronary Stent Intervention;  Surgeon: Leonie Man, MD;  Location: [place] [facility];  Service: Cardiovascular;  Laterality: N/A;  . CORONARY ANGIOPLASTY WITH STENT PLACEMENT  07/17/1998   stent LAD  . CORONARY ANGIOPLASTY WITH STENT PLACEMENT  01/07/2006   stent RCA,LAD stent patent  . CORONARY ANGIOPLASTY WITH STENT PLACEMENT  03/18/2006   stent to mid RCA,prox.RCA and LAD stent patent  . CRYOTHERAPY    . THYROID SURGERY     PT. DENIES/RADIATION  . TUBAL LIGATION      Outpatient Medications Prior to Visit  Medication Sig Dispense Refill  . albuterol (PROAIR HFA) 108 (90 BASE) MCG/ACT inhaler Inhale 2 puffs into the lungs every 6 (six) hours as needed. For coughing and/or wheezing    . aspirin EC 81 MG tablet Take 81 mg by mouth daily.    .  betamethasone valerate ointment (VALISONE) 0.1 % Apply a pea sized amount topically BID x 1-2 weeks as needed 15 g 0  . canagliflozin (INVOKANA) 100 MG TABS tablet Take 100 mg by mouth daily before breakfast.    . clopidogrel (PLAVIX) 75 MG tablet Take 1 tablet (75 mg total) by mouth daily. 30 tablet 9  . CRESTOR 20 MG tablet TAKE 1 TABLET BY MOUTH DAILY. 30 tablet 6  . ezetimibe (ZETIA) 10 MG tablet TAKE 1 TABLET (10 MG TOTAL) BY MOUTH DAILY. 30 tablet 11  . fexofenadine (ALLEGRA) 180 MG tablet Take 180 mg by mouth daily.     . potassium chloride (K-DUR) 10 MEQ tablet Take 2 tablets (20 mEq total) by mouth daily. (Patient taking differently: Take 10 mEq by mouth 2 (two) times daily. ) 60 tablet 11  . TAZTIA XT 180 MG 24 hr capsule TAKE 2 CAPSULES BY MOUTH DAILY. 60 capsule 11  . thyroid (ARMOUR) 60 MG tablet Take 60 mg by mouth every morning.     . valsartan-hydrochlorothiazide (DIOVAN-HCT) 160-12.5 MG tablet Take 1 tablet by mouth daily.    . carvedilol (COREG) 3.125 MG tablet Take 1 tablet (3.125 mg total) by mouth 2 (two) times daily. 180 tablet 3  . metroNIDAZOLE (FLAGYL) 500 MG tablet Take 1 tablet (500 mg total) by mouth 2 (two) times daily. (Patient not taking: Reported on 12/19/2015) 14 tablet 0   No facility-administered medications prior to visit.      Allergies:   Levothyroxine; Lopressor [metoprolol tartrate]; Metformin and related; and Norvasc [amlodipine besylate]   Social History   Social History  . Marital status: Married    Spouse name: N/A  . Number of children: N/A  . Years of education: N/A   Occupational History  . EKG [employer]   Social History Main Topics  . Smoking status: Former Research scientist (life sciences)  . Smokeless tobacco: Former Systems developer    Quit date: 01/05/2005  . Alcohol use No  . Drug use: No  . Sexual activity: Yes    Partners: Male    Birth control/ protection: Post-menopausal   Other Topics Concern  . None   Social History Narrative  . None     Family  History:  The patient's family history includes Esophageal cancer in her maternal aunt; Hypertension in her mother; Prostate cancer in her maternal uncle; Thyroid disease in her mother.   ROS:   Please see the history of present illness.    ROS All other systems reviewed and are negative.   PHYSICAL EXAM:   VS:  BP 124/72 (BP Location: Left Arm, Patient Position: Sitting, Cuff Size: Normal)   Pulse 82   Ht 5\' 4"  (1.626 m)   Wt 195 lb 3.2 oz (88.5 kg)   LMP 01/06/2003 (Approximate)   BMI 33.51 kg/m    GEN: Well nourished, well developed, in no acute distress  HEENT: normal  Neck: no JVD, carotid bruits, or masses Cardiac: RRR; 1/6  early peaking systolic ejection murmur in the aortic focus, no diastolic murmurs, rubs, or gallops,no edema. I checked blood pressures in both the right and left upper extremity and they are identical. However the right radial pulse is only 1+, whereas the left radial pulse is 2-3 +. Respiratory:  clear to auscultation bilaterally, normal work of breathing GI: soft, nontender, nondistended, + BS MS: no deformity or atrophy  Skin: warm and dry, no rash Neuro:  Alert and Oriented x 3, Strength and sensation are intact Psych: euthymic mood, full affect  Wt Readings from Last 3 Encounters:  12/19/15 195 lb 3.2 oz (88.5 kg)  11/27/15 193 lb (87.5 kg)  10/07/15 192 lb 3.2 oz (87.2 kg)      Studies/Labs Reviewed:   EKG:  EKG is ordered today.  The ekg ordered today demonstrates normal sinus rhythm, nonspecific minor ST depression and T-wave inversion in the inferior and anterolateral leads, QTC 439 ms  Recent Labs: 08/03/2015: BUN 9; Creatinine, Ser 0.89; Hemoglobin 16.1; Platelets 281; Potassium 3.5; Sodium 137   Lipid Panel    Component Value Date/Time   CHOL 209 (H) 08/03/2015 0450   TRIG 105 08/03/2015 0450   HDL 32 (L) 08/03/2015 0450   CHOLHDL 6.5 08/03/2015 0450   VLDL 21 08/03/2015 0450   LDLCALC 156 (H) 08/03/2015 0450   12/31/2015 Total  cholesterol 118, triglycerides 146, HDL 36, LDL 53 Hemoglobin A1c 6.6%, glucose 118 Normal LFTs, potassium 3.8, creatinine 0.98  ASSESSMENT:    1. Coronary artery disease involving native coronary artery of native heart without angina pectoris   2. Essential hypertension   3. Pure hypercholesterolemia   4. Obesity (BMI 30.0-34.9)   5. Type 2 diabetes mellitus with other circulatory complication, without long-term current use of insulin (HCC)      PLAN:  In order of problems listed above:  1. CAD: does not have angina pectoris at rest or with activity. Last revascularization procedure was in [id number]. Has normal LV function. Plan lifelong dual antiplatelet therapy, barring serious bleeding complications. Note that the pulse in her right radial artery substantially weaker than the pulse on the left side, although the arm blood pressure readings are identical. Suspect some degree of injury or residual spasm in the right radial artery after 2 cardiac catheterizations in rapid succession in late July. 2. HTN: Excellent blood pressure control. We'll switch to immediate release diltiazem. 3. HLP:  her LDL is less than 70, but HDL remains relatively low.  Encourage physical activity and exercise.  4. Obesity: Weight loss would have substantial beneficial effects for both diabetes control and hyperlipidemia. It would probably be the single most important intervention to increase her relatively low HDL cholesterol 5. DM: She has reached her goal for glycemic control    Medication Adjustments/Labs and Tests Ordered: Current medicines are reviewed at length with the patient today.  Concerns regarding medicines are outlined above.  Medication changes, Labs and Tests ordered today are listed in the Patient Instructions below. Patient Instructions  Dr Sallyanne Kuster has recommended making the following medication changes: 1. START Diltiazem 120 mg - take 1 tablet by mouth 3 times daily  Your physician  recommends that you schedule a follow-up appointment in AUGUST 2018. You will receive a reminder letter in the mail two months in advance. If you don't receive a letter, please call our office to schedule the follow-up appointment.   Signed,  Sanda Klein, MD  12/19/2015 6:20 PM    [facility]  [address], [place], [place]  [address] Phone: [phone]; Fax: [phone]    

## 2015-12-19 NOTE — Patient Instructions (Signed)
Dr Sallyanne Kuster has recommended making the following medication changes: 1. START Diltiazem 120 mg - take 1 tablet by mouth 3 times daily  Your physician recommends that you schedule a follow-up appointment in AUGUST 2018. You will receive a reminder letter in the mail two months in advance. If you don't receive a letter, please call our office to schedule the follow-up appointment.

## 2015-12-24 MED FILL — CLOPIDOGREL 75 MG TABLET: 75 | 90 days supply | Qty: 90 | Fill #2

## 2015-12-31 MED FILL — ARMOUR THYROID 60 MG TABLET: 60 | 90 days supply | Qty: 102 | Fill #2

## 2015-12-31 MED FILL — ROSUVASTATIN CALCIUM 40 MG: 40 | 90 days supply | Qty: 90 | Fill #1

## 2016-01-20 ENCOUNTER — Telehealth: Payer: Self-pay | Admitting: Pharmacist

## 2016-01-20 ENCOUNTER — Ambulatory Visit: Payer: Self-pay | Admitting: Pharmacist

## 2016-01-20 NOTE — Patient Outreach (Signed)
[facility]) Care Management  01/20/2016  Shelley [name] August 28, 1955 [id number]   Called [name] for [employer] to [employer] 3 month telephone follow up. Mrs. Bench has successfully enrolled in the [place] Application for Link to Wellness monitoring and will be managed by Bennye Alm, PharmD, BCPS.  Left HIPAA compliant VM requesting patient to call [phone] with questions or concerns regarding [employer] App.   [name], Pharm.D. PGY1 Pharmacy Resident 1/15/20184:14 PM Pager [phone]

## 2016-01-27 MED FILL — dilTIAZem HCL 120 MG TABS: 120 | 30 days supply | Qty: 90 | Fill #1

## 2016-03-06 MED FILL — ASPIR-LOW EC 81 MG TABLET: 81 | 90 days supply | Qty: 90 | Fill #2

## 2016-03-09 ENCOUNTER — Other Ambulatory Visit: Payer: Self-pay | Admitting: Cardiovascular Disease

## 2016-03-09 MED FILL — INVOKANA 100 MG TABLET: 100 | 90 days supply | Qty: 90 | Fill #0

## 2016-03-09 MED FILL — VALSARTAN-HCTZ 160-12.5 MG: 160-12.5 | 90 days supply | Qty: 90 | Fill #0

## 2016-03-09 MED FILL — dilTIAZem HCL 120 MG TABS: 120 | 30 days supply | Qty: 90 | Fill #2

## 2016-03-09 MED FILL — KLOR-CON M10 TABLET: 10 | 90 days supply | Qty: 180 | Fill #2

## 2016-03-09 NOTE — Telephone Encounter (Signed)
Rx has been sent to the pharmacy electronically. ° °

## 2016-03-11 MED FILL — ACCU-CHEK GUIDE TEST STRIP: 90 days supply | Qty: 200 | Fill #0

## 2016-03-11 MED FILL — ACCU-CHEK FASTCLIX LANCETS: 90 days supply | Qty: 204 | Fill #0

## 2016-03-16 MED FILL — CARVEDILOL 3.125 MG TABLET: 3.125 | 90 days supply | Qty: 180 | Fill #2

## 2016-03-16 MED FILL — EZETIMIBE 10 MG TABLET: 10 | 90 days supply | Qty: 90 | Fill #4

## 2016-03-17 DIAGNOSIS — E039 Hypothyroidism, unspecified: Secondary | ICD-10-CM | POA: Diagnosis not present

## 2016-03-18 DIAGNOSIS — B356 Tinea cruris: Secondary | ICD-10-CM | POA: Diagnosis not present

## 2016-03-18 DIAGNOSIS — E785 Hyperlipidemia, unspecified: Secondary | ICD-10-CM | POA: Diagnosis not present

## 2016-03-18 DIAGNOSIS — I10 Essential (primary) hypertension: Secondary | ICD-10-CM | POA: Diagnosis not present

## 2016-03-18 DIAGNOSIS — E039 Hypothyroidism, unspecified: Secondary | ICD-10-CM | POA: Diagnosis not present

## 2016-03-18 DIAGNOSIS — E118 Type 2 diabetes mellitus with unspecified complications: Secondary | ICD-10-CM | POA: Diagnosis not present

## 2016-03-18 DIAGNOSIS — M25551 Pain in right hip: Secondary | ICD-10-CM | POA: Diagnosis not present

## 2016-03-18 MED FILL — CELECOXIB 100 MG CAPSULE: 100 | 30 days supply | Qty: 60 | Fill #0

## 2016-03-19 MED FILL — FLUCONAZOLE 150 MG TABLET: 150 | 3 days supply | Qty: 3 | Fill #0

## 2016-03-27 MED FILL — ROSUVASTATIN CALCIUM 40 MG: 40 | 90 days supply | Qty: 90 | Fill #2

## 2016-03-27 MED FILL — CLOPIDOGREL 75 MG TABLET: 75 | 90 days supply | Qty: 90 | Fill #3

## 2016-04-13 MED FILL — ARMOUR THYROID 60 MG TABLET: 60 | 90 days supply | Qty: 90 | Fill #1

## 2016-04-29 DIAGNOSIS — R768 Other specified abnormal immunological findings in serum: Secondary | ICD-10-CM | POA: Diagnosis not present

## 2016-04-29 DIAGNOSIS — R682 Dry mouth, unspecified: Secondary | ICD-10-CM | POA: Diagnosis not present

## 2016-04-29 MED FILL — dilTIAZem HCL 120 MG TABS: 120 | 30 days supply | Qty: 90 | Fill #3

## 2016-05-13 ENCOUNTER — Ambulatory Visit (INDEPENDENT_AMBULATORY_CARE_PROVIDER_SITE_OTHER): Payer: 59 | Admitting: Orthopaedic Surgery

## 2016-05-13 ENCOUNTER — Ambulatory Visit (INDEPENDENT_AMBULATORY_CARE_PROVIDER_SITE_OTHER): Payer: 59

## 2016-05-13 ENCOUNTER — Encounter: Payer: Self-pay | Admitting: Orthopaedic Surgery

## 2016-05-13 VITALS — BP 109/73 | HR 75 | Ht 65.0 in | Wt 202.0 lb

## 2016-05-13 DIAGNOSIS — M25551 Pain in right hip: Secondary | ICD-10-CM | POA: Diagnosis not present

## 2016-05-13 DIAGNOSIS — G8929 Other chronic pain: Secondary | ICD-10-CM

## 2016-05-13 DIAGNOSIS — M1611 Unilateral primary osteoarthritis, right hip: Secondary | ICD-10-CM | POA: Diagnosis not present

## 2016-05-13 MED ORDER — DICLOFENAC SODIUM 75 MG PO TBEC: 75.0000 mg | DELAYED_RELEASE_TABLET | Freq: Two times a day (BID) | ORAL | 2 refills | Status: DC

## 2016-05-13 MED FILL — DICLOFENAC SOD 75 MG TAB EC: 75 | 30 days supply | Qty: 60 | Fill #0

## 2016-05-13 NOTE — Progress Notes (Signed)
09/73   

## 2016-05-13 NOTE — Progress Notes (Signed)
Subjective:    Patient ID: Shelley [name], female    DOB: [birth year], 61 y.o.   MRN: [id number]  HPI She has right hip pain.  She has had the pain for some time.  She has no trauma.  She says it has gotten worse over the last six moths.  She has no numbness.  She has not taken anything for it other than Tylenol.  She has pain when first getting up after sitting a while.  She has pain after walking a distance.  She has no swelling.  She has no other joint pains.   Review of Systems  HENT: Negative for congestion.   Respiratory: Negative for cough and shortness of breath.   Cardiovascular: Negative for chest pain and leg swelling.  Endocrine: Positive for cold intolerance.  Musculoskeletal: Positive for arthralgias and gait problem.  Allergic/Immunologic: Positive for environmental allergies.   Past Medical History:  Diagnosis Date  . CAD (coronary artery disease)   . Cardiac arrhythmia   . DES exposure in utero   . Diabetes mellitus   . Dyslipidemia   . Hypertension   . MI (myocardial infarction) ([place])   . Shingles   . Thyroid disease     Past Surgical History:  Procedure Laterality Date  . BACK SURGERY  5/99 & 6/99  . CARDIAC CATHETERIZATION  01/12/2002   stent patent  . CARDIAC CATHETERIZATION  09/03/2006   patent stents  . CARDIAC CATHETERIZATION N/A 07/31/2015   Procedure: Left Heart Cath and Coronary Angiography;  Surgeon: Burnell Blanks, MD;  Location: [facility];  Service: Cardiovascular;  Laterality: N/A;  . CARDIAC CATHETERIZATION N/A 07/31/2015   Procedure: Coronary Stent Intervention;  Surgeon: Burnell Blanks, MD;  Location: [facility];  Service: Cardiovascular;  Laterality: N/A;  . CARDIAC CATHETERIZATION N/A 08/02/2015   Procedure: Coronary Stent Intervention;  Surgeon: Leonie Man, MD;  Location: [facility];  Service: Cardiovascular;  Laterality: N/A;  . CORONARY ANGIOPLASTY WITH STENT PLACEMENT  07/17/1998   stent  LAD  . CORONARY ANGIOPLASTY WITH STENT PLACEMENT  01/07/2006   stent RCA,LAD stent patent  . CORONARY ANGIOPLASTY WITH STENT PLACEMENT  03/18/2006   stent to mid RCA,prox.RCA and LAD stent patent  . CRYOTHERAPY    . THYROID SURGERY     PT. DENIES/RADIATION  . TUBAL LIGATION      Current Outpatient Prescriptions on File Prior to Visit  Medication Sig Dispense Refill  . albuterol (PROAIR HFA) 108 (90 BASE) MCG/ACT inhaler Inhale 2 puffs into the lungs every 6 (six) hours as needed. For coughing and/or wheezing    . aspirin EC 81 MG tablet Take 81 mg by mouth daily.    . betamethasone valerate ointment (VALISONE) 0.1 % Apply a pea sized amount topically BID x 1-2 weeks as needed 15 g 0  . canagliflozin (INVOKANA) 100 MG TABS tablet Take 100 mg by mouth daily before breakfast.    . clopidogrel (PLAVIX) 75 MG tablet Take 1 tablet (75 mg total) by mouth daily. 30 tablet 9  . CRESTOR 20 MG tablet TAKE 1 TABLET BY MOUTH DAILY. 30 tablet 6  . diltiazem (CARDIZEM) 120 MG tablet Take 1 tablet (120 mg total) by mouth 3 (three) times daily. 90 tablet 11  . ezetimibe (ZETIA) 10 MG tablet TAKE 1 TABLET (10 MG TOTAL) BY MOUTH DAILY. 30 tablet 11  . fexofenadine (ALLEGRA) 180 MG tablet Take 180 mg by mouth daily.     Marland Kitchen  potassium chloride (K-DUR) 10 MEQ tablet Take 2 tablets (20 mEq total) by mouth daily. (Patient taking differently: Take 10 mEq by mouth 2 (two) times daily. ) 60 tablet 11  . TAZTIA XT 180 MG 24 hr capsule TAKE 2 CAPSULES BY MOUTH DAILY. 60 capsule 11  . thyroid (ARMOUR) 60 MG tablet Take 60 mg by mouth every morning.     . valsartan-hydrochlorothiazide (DIOVAN-HCT) 160-12.5 MG tablet Take 1 tablet by mouth daily.    . valsartan-hydrochlorothiazide (DIOVAN-HCT) 160-12.5 MG tablet TAKE 1 TABLET BY MOUTH DAILY 90 tablet 1   No current facility-administered medications on file prior to visit.     Social History   Social History  . Marital status: Married    Spouse name: N/A  . Number of  children: N/A  . 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 Smoking status: Former Research scientist (life sciences)  . Smokeless tobacco: Former Systems developer    Quit date: 01/05/2005  . Alcohol use No  . Drug use: No  . Sexual activity: Yes    Partners: Male    Birth control/ protection: Post-menopausal   Other Topics Concern  . Not on file   Social History Narrative  . No narrative on file    Family History  Problem Relation Age of Onset  . Hypertension Mother   . Thyroid disease Mother   . Esophageal cancer Maternal Aunt   . Prostate cancer Maternal Uncle     BP 109/73   Pulse 75   Ht 5\' 5"  (1.651 m)   Wt 202 lb (91.6 kg)   LMP 01/06/2003 (Approximate)   BMI 33.61 kg/m      Objective:   Physical Exam  Constitutional: She is oriented to person, place, and time. She appears well-developed and well-nourished.  HENT:  Head: Normocephalic and atraumatic.  Eyes: Conjunctivae and EOM are normal. Pupils are equal, round, and reactive to light.  Neck: Normal range of motion. Neck supple.  Cardiovascular: Normal rate, regular rhythm and intact distal pulses.   Pulmonary/Chest: Effort normal.  Abdominal: Soft.  Musculoskeletal: She exhibits tenderness (Right hip with painful ROM, limp to the right, internal 15, external 20, flexion 90, extension 10, ab-adduction 25,).  Neurological: She is alert and oriented to person, place, and time. She displays normal reflexes. No cranial nerve deficit. She exhibits normal muscle tone. Coordination normal.  Skin: Skin is warm and dry.  Psychiatric: She has a normal mood and affect. Her behavior is normal. Judgment and thought content normal.  Vitals reviewed.  X-rays were done of the right hip, reported separately.       Assessment & Plan:   Encounter Diagnoses  Name Primary?  . Chronic pain of right hip Yes  . Primary osteoarthritis of right hip    She has degenerative joint disease of the right  hip.  She may be candidate for total joint.  I will begin diclofenac 75 mgm po bid pc.  Return in two weeks.  Call if any problem  Precautions discussed.  Electronically Signed Sanjuana Kava, MD 5/9/20188:26 PM

## 2016-05-14 ENCOUNTER — Ambulatory Visit: Payer: 59 | Admitting: Orthopaedic Surgery

## 2016-05-27 ENCOUNTER — Ambulatory Visit (INDEPENDENT_AMBULATORY_CARE_PROVIDER_SITE_OTHER): Payer: 59 | Admitting: Orthopaedic Surgery

## 2016-05-27 ENCOUNTER — Encounter: Payer: Self-pay | Admitting: Orthopaedic Surgery

## 2016-05-27 VITALS — BP 115/75 | HR 74 | Ht 66.5 in | Wt 208.0 lb

## 2016-05-27 DIAGNOSIS — G8929 Other chronic pain: Secondary | ICD-10-CM | POA: Diagnosis not present

## 2016-05-27 DIAGNOSIS — M25551 Pain in right hip: Secondary | ICD-10-CM | POA: Diagnosis not present

## 2016-05-27 NOTE — Progress Notes (Signed)
Patient Shelley [name], female DOB:1955-04-29, 61 y.o. JGO:[id number]  Chief Complaint  Patient presents with  . Hip Pain    Right    HPI  Shelley [name] is a 61 y.o. female who has right hip pain.  She is improved after being on the diclofenac these last two weeks.  She has less pain.  She has no new trauma. HPI  Body mass index is 33.07 kg/m.  ROS  Review of Systems  HENT: Negative for congestion.   Respiratory: Negative for cough and shortness of breath.   Cardiovascular: Negative for chest pain and leg swelling.  Endocrine: Positive for cold intolerance.  Musculoskeletal: Positive for arthralgias and gait problem.  Allergic/Immunologic: Positive for environmental allergies.    Past Medical History:  Diagnosis Date  . CAD (coronary artery disease)   . Cardiac arrhythmia   . DES exposure in utero   . Diabetes mellitus   . Dyslipidemia   . Hypertension   . MI (myocardial infarction) ([place])   . Shingles   . Thyroid disease     Past Surgical History:  Procedure Laterality Date  . BACK SURGERY  5/99 & 6/99  . CARDIAC CATHETERIZATION  01/12/2002   stent patent  . CARDIAC CATHETERIZATION  09/03/2006   patent stents  . CARDIAC CATHETERIZATION N/A 07/31/2015   Procedure: Left Heart Cath and Coronary Angiography;  Surgeon: Burnell Blanks, MD;  Location: [facility];  Service: Cardiovascular;  Laterality: N/A;  . CARDIAC CATHETERIZATION N/A 07/31/2015   Procedure: Coronary Stent Intervention;  Surgeon: Burnell Blanks, MD;  Location: [facility];  Service: Cardiovascular;  Laterality: N/A;  . CARDIAC CATHETERIZATION N/A 08/02/2015   Procedure: Coronary Stent Intervention;  Surgeon: Leonie Man, MD;  Location: [facility];  Service: Cardiovascular;  Laterality: N/A;  . CORONARY ANGIOPLASTY WITH STENT PLACEMENT  07/17/1998   stent LAD  . CORONARY ANGIOPLASTY WITH STENT PLACEMENT  01/07/2006   stent RCA,LAD stent patent  .  CORONARY ANGIOPLASTY WITH STENT PLACEMENT  03/18/2006   stent to mid RCA,prox.RCA and LAD stent patent  . CRYOTHERAPY    . THYROID SURGERY     PT. DENIES/RADIATION  . TUBAL LIGATION      Family History  Problem Relation Age of Onset  . Hypertension Mother   . Thyroid disease Mother   . Esophageal cancer Maternal Aunt   . Prostate cancer Maternal Uncle     Social History Social History  Substance Use Topics  . Smoking status: Former Research scientist (life sciences)  . Smokeless tobacco: Former Systems developer    Quit date: 01/05/2005  . Alcohol use No    Allergies  Allergen Reactions  . Levothyroxine Itching    Patient thinks it was the dye in the tablets she was allergic to, she feels like "something is crawling under my skin"  . Lopressor [Metoprolol Tartrate] Cough    Fatigue   . Metformin And Related Diarrhea  . Norvasc [Amlodipine Besylate] Palpitations    "made my heart race"    Current Outpatient Prescriptions  Medication Sig Dispense Refill  . ACCU-CHEK FASTCLIX LANCETS MISC   3  . ACCU-CHEK GUIDE test strip   3  . albuterol (PROAIR HFA) 108 (90 BASE) MCG/ACT inhaler Inhale 2 puffs into the lungs every 6 (six) hours as needed. For coughing and/or wheezing    . aspirin EC 81 MG tablet Take 81 mg by mouth daily.    . betamethasone valerate ointment (VALISONE) 0.1 % Apply a  pea sized amount topically BID x 1-2 weeks as needed 15 g 0  . canagliflozin (INVOKANA) 100 MG TABS tablet Take 100 mg by mouth daily before breakfast.    . carvedilol (COREG) 3.125 MG tablet   3  . celecoxib (CELEBREX) 100 MG capsule   0  . clopidogrel (PLAVIX) 75 MG tablet Take 1 tablet (75 mg total) by mouth daily. 30 tablet 9  . CRESTOR 20 MG tablet TAKE 1 TABLET BY MOUTH DAILY. 30 tablet 6  . diclofenac (VOLTAREN) 75 MG EC tablet Take 1 tablet (75 mg total) by mouth 2 (two) times daily with a meal. 60 tablet 2  . diltiazem (CARDIZEM) 120 MG tablet Take 1 tablet (120 mg total) by mouth 3 (three) times daily. 90 tablet 11  .  ezetimibe (ZETIA) 10 MG tablet TAKE 1 TABLET (10 MG TOTAL) BY MOUTH DAILY. 30 tablet 11  . fexofenadine (ALLEGRA) 180 MG tablet Take 180 mg by mouth daily.     . fluconazole (DIFLUCAN) 150 MG tablet   3  . KLOR-CON M10 10 MEQ tablet   10  . potassium chloride (K-DUR) 10 MEQ tablet Take 2 tablets (20 mEq total) by mouth daily. (Patient taking differently: Take 10 mEq by mouth 2 (two) times daily. ) 60 tablet 11  . rosuvastatin (CRESTOR) 40 MG tablet   3  . TAZTIA XT 180 MG 24 hr capsule TAKE 2 CAPSULES BY MOUTH DAILY. 60 capsule 11  . thyroid (ARMOUR) 60 MG tablet Take 60 mg by mouth every morning.     . valsartan-hydrochlorothiazide (DIOVAN-HCT) 160-12.5 MG tablet Take 1 tablet by mouth daily.    . valsartan-hydrochlorothiazide (DIOVAN-HCT) 160-12.5 MG tablet TAKE 1 TABLET BY MOUTH DAILY 90 tablet 1   No current facility-administered medications for this visit.      Physical Exam  Blood pressure 115/75, pulse 74, height 5' 6.5" (1.689 m), weight 208 lb (94.3 kg), last menstrual period 01/06/2003.  Constitutional: overall normal hygiene, normal nutrition, well developed, normal grooming, normal body habitus. Assistive device:none  Musculoskeletal: gait and station Limp right, muscle tone and strength are normal, no tremors or atrophy is present.  .  Neurological: coordination overall normal.  Deep tendon reflex/nerve stretch intact.  Sensation normal.  Cranial nerves II-XII intact.   Skin:   Normal overall no scars, lesions, ulcers or rashes. No psoriasis.  Psychiatric: Alert and oriented x 3.  Recent memory intact, remote memory unclear.  Normal mood and affect. Well groomed.  Good eye contact.  Cardiovascular: overall no swelling, no varicosities, no edema bilaterally, normal temperatures of the legs and arms, no clubbing, cyanosis and good capillary refill.  Lymphatic: palpation is normal.  Right hip has good ROM and no redness.  NV intact.  The patient has been educated about  the nature of the problem(s) and counseled on treatment options.  The patient appeared to understand what I have discussed and is in agreement with it.  Encounter Diagnosis  Name Primary?  . Chronic pain of right hip Yes    PLAN Call if any problems.  Precautions discussed.  Continue current medications.   Return to clinic 6 weeks   Electronically Signed Sanjuana Kava, MD 5/23/20182:51 PM

## 2016-06-02 MED FILL — VALSARTAN-HCTZ 160-12.5 MG: 160-12.5 | 90 days supply | Qty: 90 | Fill #1

## 2016-06-02 MED FILL — ASPIRIN EC 81 MG TABLET: 81 | 60 days supply | Qty: 60 | Fill #3

## 2016-06-02 MED FILL — INVOKANA 100 MG TABLET: 100 | 90 days supply | Qty: 90 | Fill #1

## 2016-06-11 DIAGNOSIS — E039 Hypothyroidism, unspecified: Secondary | ICD-10-CM | POA: Diagnosis not present

## 2016-06-11 DIAGNOSIS — I10 Essential (primary) hypertension: Secondary | ICD-10-CM | POA: Diagnosis not present

## 2016-06-11 DIAGNOSIS — E118 Type 2 diabetes mellitus with unspecified complications: Secondary | ICD-10-CM | POA: Diagnosis not present

## 2016-06-15 ENCOUNTER — Other Ambulatory Visit: Payer: Self-pay | Admitting: *Deleted

## 2016-06-15 MED ORDER — EZETIMIBE 10 MG PO TABS: ORAL_TABLET | ORAL | 2 refills | Status: DC

## 2016-06-15 MED FILL — DICLOFENAC SOD 75 MG TAB EC: 75 | 30 days supply | Qty: 60 | Fill #1

## 2016-06-15 MED FILL — dilTIAZem HCL 120 MG TABS: 120 | 30 days supply | Qty: 90 | Fill #4

## 2016-06-16 MED FILL — EZETIMIBE 10 MG TAB: 10 | 90 days supply | Qty: 90 | Fill #0

## 2016-06-17 DIAGNOSIS — Z0000 Encounter for general adult medical examination without abnormal findings: Secondary | ICD-10-CM | POA: Diagnosis not present

## 2016-06-17 MED FILL — ROSUVASTATIN CALCIUM 40 MG: 40 | 90 days supply | Qty: 90 | Fill #0

## 2016-06-18 DIAGNOSIS — R748 Abnormal levels of other serum enzymes: Secondary | ICD-10-CM | POA: Diagnosis not present

## 2016-06-18 MED FILL — DOXEPIN 10 MG CAPSULE: 10 | 30 days supply | Qty: 30 | Fill #0

## 2016-06-22 DIAGNOSIS — I10 Essential (primary) hypertension: Secondary | ICD-10-CM | POA: Diagnosis not present

## 2016-06-22 DIAGNOSIS — E039 Hypothyroidism, unspecified: Secondary | ICD-10-CM | POA: Diagnosis not present

## 2016-06-22 DIAGNOSIS — E1165 Type 2 diabetes mellitus with hyperglycemia: Secondary | ICD-10-CM | POA: Diagnosis not present

## 2016-06-22 DIAGNOSIS — E7800 Pure hypercholesterolemia, unspecified: Secondary | ICD-10-CM | POA: Diagnosis not present

## 2016-06-23 MED FILL — ARMOUR THYROID 15 MG TABLET: 15 | 90 days supply | Qty: 90 | Fill #0

## 2016-06-26 MED FILL — POTASSIUM CL 10 MEQ TAB SA: 10 | 60 days supply | Qty: 120 | Fill #3

## 2016-06-30 ENCOUNTER — Other Ambulatory Visit: Payer: Self-pay | Admitting: Physician Assistant

## 2016-06-30 MED FILL — CLOPIDOGREL 75 MG TABLET: 75 | 30 days supply | Qty: 30 | Fill #0

## 2016-06-30 MED FILL — CARVEDILOL 3.125 MG TABLET: 3.125 | 90 days supply | Qty: 180 | Fill #3

## 2016-07-09 ENCOUNTER — Ambulatory Visit: Payer: 59 | Admitting: Orthopaedic Surgery

## 2016-07-14 ENCOUNTER — Ambulatory Visit (INDEPENDENT_AMBULATORY_CARE_PROVIDER_SITE_OTHER): Payer: 59 | Admitting: Orthopaedic Surgery

## 2016-07-14 ENCOUNTER — Encounter: Payer: Self-pay | Admitting: Orthopaedic Surgery

## 2016-07-14 VITALS — BP 122/68 | HR 81 | Temp 97.4°F | Ht 66.5 in | Wt 200.0 lb

## 2016-07-14 DIAGNOSIS — G8929 Other chronic pain: Secondary | ICD-10-CM | POA: Diagnosis not present

## 2016-07-14 DIAGNOSIS — M25551 Pain in right hip: Secondary | ICD-10-CM | POA: Diagnosis not present

## 2016-07-14 NOTE — Progress Notes (Signed)
Patient [id number] Shelley [name], female DOB:25-Jul-1955, 61 y.o. VFI:[id number]  Chief Complaint  Patient presents with  . Follow-up    right hip pain    HPI  Shelley [name] is a 61 y.o. female who has chronic hip pain that is much improved on her diclofenac. She takes Plavix and has been told to stop the diclofenac.  But when she does, she has more pain.  I have asked her to talk to her heart doctor about this.  I have suggested cutting back to one a day after eating to one every other day.  She is so much improved she says. HPI  Body mass index is 31.8 kg/m.  ROS  Review of Systems  HENT: Negative for congestion.   Respiratory: Negative for cough and shortness of breath.   Cardiovascular: Negative for chest pain and leg swelling.  Endocrine: Positive for cold intolerance.  Musculoskeletal: Positive for arthralgias and gait problem.  Allergic/Immunologic: Positive for environmental allergies.    Past Medical History:  Diagnosis Date  . CAD (coronary artery disease)   . Cardiac arrhythmia   . DES exposure in utero   . Diabetes mellitus   . Dyslipidemia   . Hypertension   . MI (myocardial infarction) ([place])   . Shingles   . Thyroid disease     Past Surgical History:  Procedure Laterality Date  . BACK SURGERY  5/99 & 6/99  . CARDIAC CATHETERIZATION  01/12/2002   stent patent  . CARDIAC CATHETERIZATION  09/03/2006   patent stents  . CARDIAC CATHETERIZATION N/A 07/31/2015   Procedure: Left Heart Cath and Coronary Angiography;  Surgeon: Burnell Blanks, MD;  Location: [facility];  Service: Cardiovascular;  Laterality: N/A;  . CARDIAC CATHETERIZATION N/A 07/31/2015   Procedure: Coronary Stent Intervention;  Surgeon: Burnell Blanks, MD;  Location: [facility];  Service: Cardiovascular;  Laterality: N/A;  . CARDIAC CATHETERIZATION N/A 08/02/2015   Procedure: Coronary Stent Intervention;  Surgeon: Leonie Man, MD;  Location: [facility];   Service: Cardiovascular;  Laterality: N/A;  . CORONARY ANGIOPLASTY WITH STENT PLACEMENT  07/17/1998   stent LAD  . CORONARY ANGIOPLASTY WITH STENT PLACEMENT  01/07/2006   stent RCA,LAD stent patent  . CORONARY ANGIOPLASTY WITH STENT PLACEMENT  03/18/2006   stent to mid RCA,prox.RCA and LAD stent patent  . CRYOTHERAPY    . THYROID SURGERY     PT. DENIES/RADIATION  . TUBAL LIGATION      Family History  Problem Relation Age of Onset  . Hypertension Mother   . Thyroid disease Mother   . Esophageal cancer Maternal Aunt   . Prostate cancer Maternal Uncle     Social History Social History  Substance Use Topics  . Smoking status: Former Research scientist (life sciences)  . Smokeless tobacco: Former Systems developer    Quit date: 01/05/2005  . Alcohol use No    Allergies  Allergen Reactions  . Levothyroxine Itching    Patient thinks it was the dye in the tablets she was allergic to, she feels like "something is crawling under my skin"  . Lopressor [Metoprolol Tartrate] Cough    Fatigue   . Metformin And Related Diarrhea  . Norvasc [Amlodipine Besylate] Palpitations    "made my heart race"    Current Outpatient Prescriptions  Medication Sig Dispense Refill  . ACCU-CHEK FASTCLIX LANCETS MISC   3  . ACCU-CHEK GUIDE test strip   3  . albuterol (PROAIR HFA) 108 (90 BASE) MCG/ACT inhaler Inhale  2 puffs into the lungs every 6 (six) hours as needed. For coughing and/or wheezing    . aspirin EC 81 MG tablet Take 81 mg by mouth daily.    . betamethasone valerate ointment (VALISONE) 0.1 % Apply a pea sized amount topically BID x 1-2 weeks as needed 15 g 0  . canagliflozin (INVOKANA) 100 MG TABS tablet Take 100 mg by mouth daily before breakfast.    . carvedilol (COREG) 3.125 MG tablet   3  . celecoxib (CELEBREX) 100 MG capsule   0  . clopidogrel (PLAVIX) 75 MG tablet TAKE 1 TABLET BY MOUTH ONCE DAILY 30 tablet 3  . CRESTOR 20 MG tablet TAKE 1 TABLET BY MOUTH DAILY. 30 tablet 6  . diclofenac (VOLTAREN) 75 MG EC tablet Take 1  tablet (75 mg total) by mouth 2 (two) times daily with a meal. 60 tablet 2  . diltiazem (CARDIZEM) 120 MG tablet Take 1 tablet (120 mg total) by mouth 3 (three) times daily. 90 tablet 11  . ezetimibe (ZETIA) 10 MG tablet TAKE 1 TABLET (10 MG TOTAL) BY MOUTH DAILY. 30 tablet 2  . fexofenadine (ALLEGRA) 180 MG tablet Take 180 mg by mouth daily.     . fluconazole (DIFLUCAN) 150 MG tablet   3  . KLOR-CON M10 10 MEQ tablet   10  . potassium chloride (K-DUR) 10 MEQ tablet Take 2 tablets (20 mEq total) by mouth daily. (Patient taking differently: Take 10 mEq by mouth 2 (two) times daily. ) 60 tablet 11  . rosuvastatin (CRESTOR) 40 MG tablet   3  . TAZTIA XT 180 MG 24 hr capsule TAKE 2 CAPSULES BY MOUTH DAILY. 60 capsule 11  . thyroid (ARMOUR) 60 MG tablet Take 60 mg by mouth every morning.     . valsartan-hydrochlorothiazide (DIOVAN-HCT) 160-12.5 MG tablet Take 1 tablet by mouth daily.    . valsartan-hydrochlorothiazide (DIOVAN-HCT) 160-12.5 MG tablet TAKE 1 TABLET BY MOUTH DAILY 90 tablet 1   No current facility-administered medications for this visit.      Physical Exam  Blood pressure 122/68, pulse 81, temperature (!) 97.4 F (36.3 C), height 5' 6.5" (1.689 m), weight 200 lb (90.7 kg), last menstrual period 01/06/2003.  Constitutional: overall normal hygiene, normal nutrition, well developed, normal grooming, normal body habitus. Assistive device:none  Musculoskeletal: gait and station Limp none, muscle tone and strength are normal, no tremors or atrophy is present.  .  Neurological: coordination overall normal.  Deep tendon reflex/nerve stretch intact.  Sensation normal.  Cranial nerves II-XII intact.   Skin:   Normal overall no scars, lesions, ulcers or rashes. No psoriasis.  Psychiatric: Alert and oriented x 3.  Recent memory intact, remote memory unclear.  Normal mood and affect. Well groomed.  Good eye contact.  Cardiovascular: overall no swelling, no varicosities, no edema  bilaterally, normal temperatures of the legs and arms, no clubbing, cyanosis and good capillary refill.  Lymphatic: palpation is normal.  She has normal ROM of both hips and no pain, no limp.  The patient has been educated about the nature of the problem(s) and counseled on treatment options.  The patient appeared to understand what I have discussed and is in agreement with it.  Encounter Diagnosis  Name Primary?  . Chronic pain of right hip Yes    PLAN Call if any problems.  Precautions discussed.  Continue current medications with precautions as stated above.  Take the minimum dose to get the best results.  Return to clinic  prn   Electronically Signed Sanjuana Kava, MD 7/10/20183:23 PM

## 2016-07-15 MED FILL — DICLOFENAC SOD 75 MG TAB EC: 75 | 30 days supply | Qty: 60 | Fill #2

## 2016-07-20 MED FILL — dilTIAZem HCL 120 MG TABS: 120 | 30 days supply | Qty: 90 | Fill #5

## 2016-07-23 ENCOUNTER — Other Ambulatory Visit: Payer: Self-pay | Admitting: Cardiology

## 2016-07-23 MED ORDER — IRBESARTAN-HYDROCHLOROTHIAZIDE 150-12.5 MG PO TABS: 1.0000 | ORAL_TABLET | Freq: Every day | ORAL | 4 refills | Status: DC

## 2016-07-23 NOTE — Progress Notes (Signed)
Changed from valsartan hctz to irbesartan at request of pt. Due to recall

## 2016-07-23 NOTE — Addendum Note (Signed)
Addended by: Gaetano Net on: 07/23/2016 03:00 PM   Modules accepted: Orders

## 2016-07-24 MED FILL — IRBESARTAN-HCTZ 150-12.5 MG: 150-12.5 | 90 days supply | Qty: 90 | Fill #0

## 2016-08-03 MED FILL — CLOPIDOGREL 75 MG TABLET: 75 | 90 days supply | Qty: 90 | Fill #1

## 2016-08-06 ENCOUNTER — Other Ambulatory Visit: Payer: Self-pay | Admitting: Physician Assistant

## 2016-08-06 MED FILL — ARMOUR THYROID 60 MG TABLET: 60 | 90 days supply | Qty: 90 | Fill #0

## 2016-08-06 NOTE — Telephone Encounter (Signed)
Please review for refill, thanks ! 

## 2016-08-07 MED FILL — ASPIRIN EC 81 MG TABLET: 81 | 90 days supply | Qty: 90 | Fill #0

## 2016-08-25 ENCOUNTER — Other Ambulatory Visit: Payer: Self-pay | Admitting: Physician Assistant

## 2016-08-25 ENCOUNTER — Telehealth: Payer: Self-pay | Admitting: Orthopaedic Surgery

## 2016-08-25 MED FILL — POTASSIUM CL 10 MEQ TAB SA: 10 | 30 days supply | Qty: 60 | Fill #0

## 2016-08-25 MED FILL — DICLOFENAC SOD 75 MG TAB EC: 75 | 30 days supply | Qty: 60 | Fill #0

## 2016-08-25 NOTE — Telephone Encounter (Signed)
Please review for refill, thanks ! 

## 2016-08-26 DIAGNOSIS — E039 Hypothyroidism, unspecified: Secondary | ICD-10-CM | POA: Diagnosis not present

## 2016-09-08 MED FILL — dilTIAZem HCL 120 MG TABS: 120 | 30 days supply | Qty: 90 | Fill #6

## 2016-09-08 MED FILL — INVOKANA 100 MG TABLET: 100 | 90 days supply | Qty: 90 | Fill #2

## 2016-09-11 ENCOUNTER — Encounter: Payer: Self-pay | Admitting: Cardiovascular Disease

## 2016-09-11 ENCOUNTER — Ambulatory Visit (INDEPENDENT_AMBULATORY_CARE_PROVIDER_SITE_OTHER): Payer: 59 | Admitting: Cardiovascular Disease

## 2016-09-11 VITALS — BP 110/64 | HR 84 | Ht 66.5 in | Wt 205.0 lb

## 2016-09-11 DIAGNOSIS — E669 Obesity, unspecified: Secondary | ICD-10-CM | POA: Diagnosis not present

## 2016-09-11 DIAGNOSIS — E7800 Pure hypercholesterolemia, unspecified: Secondary | ICD-10-CM | POA: Diagnosis not present

## 2016-09-11 DIAGNOSIS — E1159 Type 2 diabetes mellitus with other circulatory complications: Secondary | ICD-10-CM

## 2016-09-11 DIAGNOSIS — I10 Essential (primary) hypertension: Secondary | ICD-10-CM | POA: Diagnosis not present

## 2016-09-11 DIAGNOSIS — I2510 Atherosclerotic heart disease of native coronary artery without angina pectoris: Secondary | ICD-10-CM | POA: Diagnosis not present

## 2016-09-11 NOTE — Patient Instructions (Signed)
Dr Croitoru recommends that you schedule a follow-up appointment in 12 months. You will receive a reminder letter in the mail two months in advance. If you don't receive a letter, please call our office to schedule the follow-up appointment.  If you need a refill on your cardiac medications before your next appointment, please call your pharmacy. 

## 2016-09-11 NOTE — Progress Notes (Signed)
Patient ID: Shelley [name], female   DOB: Nov 12, 1955, 61 y.o.   MRN: [id number]    Cardiology Office Note    Date:  09/13/2016   ID:  Shelley [name], DOB [birth year], MRN [id number]  PCP:  [name], MD  Cardiologist:   Sanda Klein, MD   No chief complaint on file.   History of Present Illness:  Shelley [name] is a 61 y.o. female with premature onset coronary artery disease, diabetes mellitus, hypertension and obesity who returns for follow-up. She feels well and denies any cardiovascular complaints.   Specifically, she denies angina at rest with exertion, dyspnea, edema, focal neurological complaints, intermittent claudication, syncope or dizziness.  Her last revascularization was in July 2017 for a non-STEMI when she received a drug-eluting stent to the RCA. The pruritus that she associated with taking long-acting diltiazem resolved once we switched to immediate release diltiazem. She does okay taking that medication 3 times a day. Due to the valsartan recall, she is now taking herbal certain her blood pressure control is even better than it was before.  Her last hemoglobin A1c was 6.6%, last LDL cholesterol was 53 on labs performed December 2017. (on combination rosuvastatin and Zetia).  Her electrocardiogram has always been slightly abnormal with varying degrees of T-wave inversion in the inferior and anterolateral leads. This is the case on her ECG today. There has been little correlation between the variation in the prominence of the inverted T waves in her symptoms.  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 She has a rich history of coronary disease. In 2001, when she was only 61 years old she received a bare-metal stent to the mid LAD artery. In 2008 she presented with an acute myocardial infarction and received a 3 x 24 mm drug-eluting Taxus stent to the LAD artery. Subsequently underwent staged revascularization with a drug-eluting Cypher 2.5 x 13 mm to the  distal LAD artery and a 3.0 x 23 mm drug-eluting Cypher stent to the right coronary artery. July 31 2014 received drug-eluting stent to the distal RCA PROMUS PREM MR 3.0X16 for STEMI. She has normal left ventricular systolic function. She has never had symptoms of congestive heart failure.   Past Medical History:  Diagnosis Date  . CAD (coronary artery disease)   . Cardiac arrhythmia   . DES exposure in utero   . Diabetes mellitus   . Dyslipidemia   . Hypertension   . MI (myocardial infarction) ([place])   . Shingles   . Thyroid disease     Past Surgical History:  Procedure Laterality Date  . BACK SURGERY  5/99 & 6/99  . CARDIAC CATHETERIZATION  01/12/2002   stent patent  . CARDIAC CATHETERIZATION  09/03/2006   patent stents  . CARDIAC CATHETERIZATION N/A 07/31/2015   Procedure: Left Heart Cath and Coronary Angiography;  Surgeon: Burnell Blanks, MD;  Location: [place] [facility];  Service: Cardiovascular;  Laterality: N/A;  . CARDIAC CATHETERIZATION N/A 07/31/2015   Procedure: Coronary Stent Intervention;  Surgeon: Burnell Blanks, MD;  Location: [place] [facility];  Service: Cardiovascular;  Laterality: N/A;  . CARDIAC CATHETERIZATION N/A 08/02/2015   Procedure: Coronary Stent Intervention;  Surgeon: Leonie Man, MD;  Location: [place] [facility];  Service: Cardiovascular;  Laterality: N/A;  . CORONARY ANGIOPLASTY WITH STENT PLACEMENT  07/17/1998   stent LAD  . CORONARY ANGIOPLASTY WITH STENT PLACEMENT  01/07/2006   stent RCA,LAD stent patent  . CORONARY ANGIOPLASTY WITH STENT  PLACEMENT  03/18/2006   stent to mid RCA,prox.RCA and LAD stent patent  . CRYOTHERAPY    . THYROID SURGERY     PT. DENIES/RADIATION  . TUBAL LIGATION      Outpatient Medications Prior to Visit  Medication Sig Dispense Refill  . ACCU-CHEK FASTCLIX LANCETS MISC   3  . ACCU-CHEK GUIDE test strip   3  . albuterol (PROAIR HFA) 108 (90 BASE) MCG/ACT inhaler Inhale 2 puffs into the lungs every 6  (six) hours as needed. For coughing and/or wheezing    . ASPIRIN ADULT LOW STRENGTH 81 MG EC tablet TAKE 1 TABLET BY MOUTH ONCE DAILY 30 tablet 10  . betamethasone valerate ointment (VALISONE) 0.1 % Apply a pea sized amount topically BID x 1-2 weeks as needed 15 g 0  . canagliflozin (INVOKANA) 100 MG TABS tablet Take 100 mg by mouth daily before breakfast.    . carvedilol (COREG) 3.125 MG tablet Take 3.125 mg by mouth 2 (two) times daily with a meal.   3  . celecoxib (CELEBREX) 100 MG capsule   0  . clopidogrel (PLAVIX) 75 MG tablet TAKE 1 TABLET BY MOUTH ONCE DAILY 30 tablet 3  . CRESTOR 20 MG tablet TAKE 1 TABLET BY MOUTH DAILY. 30 tablet 6  . diclofenac (VOLTAREN) 75 MG EC tablet TAKE 1 TABLET (75 MG TOTAL) BY MOUTH 2 (TWO) TIMES DAILY WITH A MEAL. 60 tablet 5  . diltiazem (CARDIZEM) 120 MG tablet Take 1 tablet (120 mg total) by mouth 3 (three) times daily. 90 tablet 11  . fexofenadine (ALLEGRA) 180 MG tablet Take 180 mg by mouth daily.     . irbesartan-hydrochlorothiazide (AVALIDE) 150-12.5 MG tablet Take 1 tablet by mouth daily. 90 tablet 4  . potassium chloride (K-DUR) 10 MEQ tablet Take 2 tablets (20 mEq total) by mouth daily. (Patient taking differently: Take 10 mEq by mouth 2 (two) times daily. ) 60 tablet 11  . potassium chloride (K-DUR,KLOR-CON) 10 MEQ tablet TAKE 2 TABLETS BY MOUTH ONCE DAILY 60 tablet 10  . rosuvastatin (CRESTOR) 40 MG tablet   3  . thyroid (ARMOUR) 60 MG tablet Take 75 mg by mouth every morning.     Marland Kitchen TAZTIA XT 180 MG 24 hr capsule TAKE 2 CAPSULES BY MOUTH DAILY. 60 capsule 11  . ezetimibe (ZETIA) 10 MG tablet TAKE 1 TABLET (10 MG TOTAL) BY MOUTH DAILY. (Patient not taking: Reported on 09/11/2016) 30 tablet 2  . fluconazole (DIFLUCAN) 150 MG tablet   3   No facility-administered medications prior to visit.      Allergies:   Levothyroxine; Lopressor [metoprolol tartrate]; Metformin and related; and Norvasc [amlodipine besylate]   Social History   Social History    . Marital status: Married    Spouse name: N/A  . Number of children: N/A  . Years of education: N/A   Occupational History  . EKG [employer]   Social History Main Topics  . Smoking status: Former Research scientist (life sciences)  . Smokeless tobacco: Former Systems developer    Quit date: 01/05/2005  . Alcohol use No  . Drug use: No  . Sexual activity: Yes    Partners: Male    Birth control/ protection: Post-menopausal   Other Topics Concern  . None   Social History Narrative  . None     Family History:  The patient's family history includes Esophageal cancer in her maternal aunt; Hypertension in her mother; Prostate cancer in her maternal uncle; Thyroid disease in her mother.  ROS:   Please see the history of present illness.    ROS All other systems reviewed and are negative.   PHYSICAL EXAM:   VS:  BP 110/64   Pulse 84   Ht 5' 6.5" (1.689 m)   Wt 205 lb (93 kg)   LMP 01/06/2003 (Approximate)   BMI 32.59 kg/m    GEN: Well nourished, well developed, in no acute distress  HEENT: normal  Neck: no JVD, carotid bruits, or masses Cardiac: RRR; 1/6 early peaking systolic ejection murmur in the aortic focus, no diastolic murmurs, rubs, or gallops,no edema. I checked blood pressures in both the right and left upper extremity and they are identical. However the right radial pulse is only 1+, whereas the left radial pulse is 2-3 +. Respiratory:  clear to auscultation bilaterally, normal work of breathing GI: soft, nontender, nondistended, + BS MS: no deformity or atrophy  Skin: warm and dry, no rash Neuro:  Alert and Oriented x 3, Strength and sensation are intact Psych: euthymic mood, full affect  Wt Readings from Last 3 Encounters:  09/11/16 205 lb (93 kg)  07/14/16 200 lb (90.7 kg)  05/27/16 208 lb (94.3 kg)      Studies/Labs Reviewed:   EKG:  EKG is ordered today.  The ekg ordered today demonstrates normal sinus rhythm, nonspecific minor ST depression and T-wave inversion in the inferior and  anterolateral leads, Unchanged from the past ; QTC 408 ms   Lipid Panel    Component Value Date/Time   CHOL 209 (H) 08/03/2015 0450   TRIG 105 08/03/2015 0450   HDL 32 (L) 08/03/2015 0450   CHOLHDL 6.5 08/03/2015 0450   VLDL 21 08/03/2015 0450   LDLCALC 156 (H) 08/03/2015 0450   12/31/2015 Total cholesterol 118, triglycerides 146, HDL 36, LDL 53 Hemoglobin A1c 6.6%, glucose 118 Normal LFTs, potassium 3.8, creatinine 0.98  08/26/2016 TSH was 1.28   ASSESSMENT:    1. Coronary artery disease involving native coronary artery of native heart without angina pectoris   2. Essential hypertension   3. Pure hypercholesterolemia   4. Obesity (BMI 30.0-34.9)   5. Type 2 diabetes mellitus with other circulatory complication, without long-term current use of insulin (HCC)      PLAN:  In order of problems listed above:  1. CAD: does not have angina pectoris at rest or with activity. Last revascularization procedure was in [id number]. Has normal LV function. Plan lifelong dual antiplatelet therapy, barring serious bleeding complications. Note that the pulse in her right radial artery substantially weaker than the pulse on the left side, although the arm blood pressure readings are identical. Suspect some degree of injury or residual spasm in the right radial artery after 2 cardiac catheterizations in rapid succession in late July. 2. HTN: Very good control, on immediate release diltiazem since she had pruritus with the sustained release formulation capsules. Feels better on calcium channel blockers than beta blockers. 3. HLP:  her LDL is less than 70, but HDL remains relatively low.  Encourage physical activity and exercise.  4. Obesity: Weight loss would have substantial beneficial effects for both diabetes control and hyperlipidemia. It would probably be the single most important intervention to increase her relatively low HDL cholesterol 5. DM: good glycemic control    Medication  Adjustments/Labs and Tests Ordered: Current medicines are reviewed at length with the patient today.  Concerns regarding medicines are outlined above.  Medication changes, Labs and Tests ordered today are listed in the Patient Instructions below.  Patient Instructions  Dr Sallyanne Kuster recommends that you schedule a follow-up appointment in 12 months. You will receive a reminder letter in the mail two months in advance. If you don't receive a letter, please call our office to schedule the follow-up appointment.  If you need a refill on your cardiac medications before your next appointment, please call your pharmacy.    Signed,  Sanda Klein, MD  09/13/2016 11:44 AM    [facility] [address], [place], [place]  [address] Phone: [phone]; Fax: [phone]

## 2016-09-25 ENCOUNTER — Other Ambulatory Visit: Payer: Self-pay | Admitting: Cardiovascular Disease

## 2016-09-25 MED FILL — EZETIMIBE 10 MG TAB: 10 | 30 days supply | Qty: 30 | Fill #0

## 2016-09-25 MED FILL — ARMOUR THYROID 15 MG TABLET: 15 | 90 days supply | Qty: 90 | Fill #1

## 2016-09-28 MED FILL — DOXEPIN 10 MG CAPSULE: 10 | 30 days supply | Qty: 30 | Fill #0

## 2016-09-28 MED FILL — DICLOFENAC SOD 75 MG TAB EC: 75 | 30 days supply | Qty: 60 | Fill #1

## 2016-10-05 ENCOUNTER — Other Ambulatory Visit: Payer: Self-pay | Admitting: Physician Assistant

## 2016-10-05 MED FILL — CARVEDILOL 3.125 MG TABLET: 3.125 | 90 days supply | Qty: 180 | Fill #0

## 2016-10-05 NOTE — Telephone Encounter (Signed)
REFILL 

## 2016-10-23 MED FILL — POTASSIUM CL 10 MEQ TAB SA: 10 | 30 days supply | Qty: 60 | Fill #1

## 2016-10-30 MED FILL — IRBESARTAN-HCTZ 150-12.5 MG: 150-12.5 | 90 days supply | Qty: 90 | Fill #1

## 2016-10-30 MED FILL — ROSUVASTATIN CALCIUM 40 MG: 40 | 90 days supply | Qty: 90 | Fill #1

## 2016-10-30 MED FILL — EZETIMIBE 10 MG TABS: 10 | 30 days supply | Qty: 30 | Fill #1

## 2016-10-30 MED FILL — dilTIAZem HCL 120 MG TABS: 120 | 30 days supply | Qty: 90 | Fill #7

## 2016-11-04 ENCOUNTER — Other Ambulatory Visit: Payer: Self-pay | Admitting: Cardiovascular Disease

## 2016-11-04 MED FILL — DICLOFENAC SOD 75 MG TAB EC: 75 | 30 days supply | Qty: 60 | Fill #2

## 2016-11-04 MED FILL — ARMOUR THYROID 60 MG TABLET: 60 | 90 days supply | Qty: 90 | Fill #1

## 2016-11-04 MED FILL — CLOPIDOGREL 75 MG TABLET: 75 | 30 days supply | Qty: 30 | Fill #0

## 2016-11-04 NOTE — Telephone Encounter (Signed)
Rx(s) sent to pharmacy electronically.  

## 2016-11-09 MED FILL — ACCU-CHEK GUIDE TEST STRIP: 90 days supply | Qty: 200 | Fill #1

## 2016-11-09 MED FILL — ASPIRIN ADULT LOW STRENGTH: 81 | 90 days supply | Qty: 90 | Fill #1

## 2016-12-02 MED FILL — EZETIMIBE 10 MG TABS: 10 | 30 days supply | Qty: 30 | Fill #2

## 2016-12-02 MED FILL — CLOPIDOGREL 75 MG TABLET: 75 | 30 days supply | Qty: 30 | Fill #1

## 2016-12-07 ENCOUNTER — Ambulatory Visit: Payer: 59 | Admitting: Obstetrics and Gynecology

## 2016-12-07 MED FILL — INVOKANA 100 MG TABLET: 100 | 90 days supply | Qty: 90 | Fill #3

## 2016-12-12 DIAGNOSIS — E785 Hyperlipidemia, unspecified: Secondary | ICD-10-CM | POA: Diagnosis not present

## 2016-12-12 DIAGNOSIS — R748 Abnormal levels of other serum enzymes: Secondary | ICD-10-CM | POA: Diagnosis not present

## 2016-12-16 DIAGNOSIS — I10 Essential (primary) hypertension: Secondary | ICD-10-CM | POA: Diagnosis not present

## 2016-12-16 DIAGNOSIS — E118 Type 2 diabetes mellitus with unspecified complications: Secondary | ICD-10-CM | POA: Diagnosis not present

## 2016-12-16 DIAGNOSIS — L309 Dermatitis, unspecified: Secondary | ICD-10-CM | POA: Diagnosis not present

## 2016-12-16 DIAGNOSIS — E785 Hyperlipidemia, unspecified: Secondary | ICD-10-CM | POA: Diagnosis not present

## 2016-12-16 DIAGNOSIS — Z7984 Long term (current) use of oral hypoglycemic drugs: Secondary | ICD-10-CM | POA: Diagnosis not present

## 2016-12-16 DIAGNOSIS — E039 Hypothyroidism, unspecified: Secondary | ICD-10-CM | POA: Diagnosis not present

## 2016-12-16 DIAGNOSIS — R748 Abnormal levels of other serum enzymes: Secondary | ICD-10-CM | POA: Diagnosis not present

## 2016-12-16 DIAGNOSIS — L299 Pruritus, unspecified: Secondary | ICD-10-CM | POA: Diagnosis not present

## 2016-12-16 DIAGNOSIS — Z79899 Other long term (current) drug therapy: Secondary | ICD-10-CM | POA: Diagnosis not present

## 2016-12-16 MED FILL — DOXEPIN 10 MG CAPSULE: 10 | 30 days supply | Qty: 30 | Fill #0

## 2016-12-16 MED FILL — hydrOXYzine HCL 25 MG TABS: 25 | 30 days supply | Qty: 30 | Fill #0

## 2016-12-17 MED FILL — DICLOFENAC SODIUM 75 MG TAB: 75 | 30 days supply | Qty: 60 | Fill #3

## 2016-12-24 MED FILL — POTASSIUM CL 10 MEQ TAB SA: 10 | 30 days supply | Qty: 60 | Fill #2

## 2017-01-01 ENCOUNTER — Other Ambulatory Visit: Payer: Self-pay | Admitting: Cardiovascular Disease

## 2017-01-01 MED FILL — EZETIMIBE 10 MG TABS: 10 | 30 days supply | Qty: 30 | Fill #3

## 2017-01-01 MED FILL — dilTIAZem HCL 120 MG TABS: 120 | 30 days supply | Qty: 90 | Fill #0

## 2017-01-06 DIAGNOSIS — E039 Hypothyroidism, unspecified: Secondary | ICD-10-CM | POA: Diagnosis not present

## 2017-01-06 DIAGNOSIS — E1165 Type 2 diabetes mellitus with hyperglycemia: Secondary | ICD-10-CM | POA: Diagnosis not present

## 2017-01-07 MED FILL — CLOPIDOGREL 75 MG TABLET: 75 | 90 days supply | Qty: 90 | Fill #2

## 2017-01-07 MED FILL — FLUCONAZOLE 150 MG TABLET: 150 | 3 days supply | Qty: 3 | Fill #1

## 2017-01-11 DIAGNOSIS — E039 Hypothyroidism, unspecified: Secondary | ICD-10-CM | POA: Diagnosis not present

## 2017-01-11 DIAGNOSIS — E7800 Pure hypercholesterolemia, unspecified: Secondary | ICD-10-CM | POA: Diagnosis not present

## 2017-01-11 DIAGNOSIS — E1165 Type 2 diabetes mellitus with hyperglycemia: Secondary | ICD-10-CM | POA: Diagnosis not present

## 2017-01-11 DIAGNOSIS — I10 Essential (primary) hypertension: Secondary | ICD-10-CM | POA: Diagnosis not present

## 2017-01-11 MED FILL — LEVOTHYROXINE 88 MCG TABLET: 88 | 90 days supply | Qty: 90 | Fill #0

## 2017-01-11 MED FILL — JANUVIA 100 MG TABLET: 100 | 90 days supply | Qty: 90 | Fill #0

## 2017-01-18 MED FILL — hydrOXYzine HCL 25 MG TABS: 25 | 30 days supply | Qty: 30 | Fill #0

## 2017-01-25 MED FILL — DICLOFENAC SODIUM 75 MG TAB: 75 | 30 days supply | Qty: 60 | Fill #4

## 2017-01-29 MED FILL — EZETIMIBE 10 MG TABS: 10 | 30 days supply | Qty: 30 | Fill #4

## 2017-01-29 MED FILL — CARVEDILOL 3.125 MG TABLET: 3.125 | 90 days supply | Qty: 180 | Fill #1

## 2017-01-29 MED FILL — IRBESARTAN-HCTZ 150-12.5 MG: 150-12.5 | 90 days supply | Qty: 90 | Fill #2

## 2017-01-29 MED FILL — ROSUVASTATIN CALCIUM 40 MG: 40 | 90 days supply | Qty: 90 | Fill #2

## 2017-02-01 ENCOUNTER — Encounter: Payer: Self-pay | Admitting: Cardiovascular Disease

## 2017-02-03 ENCOUNTER — Ambulatory Visit (INDEPENDENT_AMBULATORY_CARE_PROVIDER_SITE_OTHER): Payer: 59 | Admitting: Orthopaedic Surgery

## 2017-02-03 ENCOUNTER — Ambulatory Visit (INDEPENDENT_AMBULATORY_CARE_PROVIDER_SITE_OTHER): Payer: 59

## 2017-02-03 ENCOUNTER — Encounter: Payer: Self-pay | Admitting: Orthopaedic Surgery

## 2017-02-03 VITALS — BP 151/90 | HR 85 | Ht 66.5 in | Wt 210.0 lb

## 2017-02-03 DIAGNOSIS — M25551 Pain in right hip: Secondary | ICD-10-CM | POA: Diagnosis not present

## 2017-02-03 DIAGNOSIS — G8929 Other chronic pain: Secondary | ICD-10-CM

## 2017-02-03 MED ORDER — HYDROCODONE-ACETAMINOPHEN 5-325 MG PO TABS: ORAL_TABLET | ORAL | 0 refills | Status: DC

## 2017-02-03 MED FILL — HYDROCODON-APAP 5-325: 5-325 | 14 days supply | Qty: 56 | Fill #0

## 2017-02-03 NOTE — Progress Notes (Signed)
Patient Shelley [name], female DOB:09-Jun-1955, 62 y.o. OEU:[id number]  Chief Complaint  Patient presents with  . Follow-up    Right Hip    HPI  Shelley [name] is a 62 y.o. female who has increasing pain of the right hip.  I saw her in May of last year and again in July.  She took diclofenac and it helped her hip.  She has done well until the last month.  The cold weather has made her worse.  She has pain when she first gets out of bed.  She has pain walking distances, she has pain with rotation of the hip.  The pain is getting worse and worse.  She has no trauma. HPI  Body mass index is 33.39 kg/m.  ROS  Review of Systems  HENT: Negative for congestion.   Respiratory: Negative for cough and shortness of breath.   Cardiovascular: Negative for chest pain and leg swelling.  Endocrine: Positive for cold intolerance.  Musculoskeletal: Positive for arthralgias and gait problem.  Allergic/Immunologic: Positive for environmental allergies.    Past Medical History:  Diagnosis Date  . CAD (coronary artery disease)   . Cardiac arrhythmia   . DES exposure in utero   . Diabetes mellitus   . Dyslipidemia   . Hypertension   . MI (myocardial infarction) ([place])   . Shingles   . Thyroid disease     Past Surgical History:  Procedure Laterality Date  . BACK SURGERY  5/99 & 6/99  . CARDIAC CATHETERIZATION  01/12/2002   stent patent  . CARDIAC CATHETERIZATION  09/03/2006   patent stents  . CARDIAC CATHETERIZATION N/A 07/31/2015   Procedure: Left Heart Cath and Coronary Angiography;  Surgeon: Burnell Blanks, MD;  Location: [place] [facility];  Service: Cardiovascular;  Laterality: N/A;  . CARDIAC CATHETERIZATION N/A 07/31/2015   Procedure: Coronary Stent Intervention;  Surgeon: Burnell Blanks, MD;  Location: [place] [facility];  Service: Cardiovascular;  Laterality: N/A;  . CARDIAC CATHETERIZATION N/A 08/02/2015   Procedure: Coronary Stent Intervention;  Surgeon:  Leonie Man, MD;  Location: [place] [facility];  Service: Cardiovascular;  Laterality: N/A;  . CORONARY ANGIOPLASTY WITH STENT PLACEMENT  07/17/1998   stent LAD  . CORONARY ANGIOPLASTY WITH STENT PLACEMENT  01/07/2006   stent RCA,LAD stent patent  . CORONARY ANGIOPLASTY WITH STENT PLACEMENT  03/18/2006   stent to mid RCA,prox.RCA and LAD stent patent  . CRYOTHERAPY    . THYROID SURGERY     PT. DENIES/RADIATION  . TUBAL LIGATION      Family History  Problem Relation Age of Onset  . Hypertension Mother   . Thyroid disease Mother   . Esophageal cancer Maternal Aunt   . Prostate cancer Maternal Uncle     Social History Social History   Tobacco Use  . Smoking status: Former Research scientist (life sciences)  . Smokeless tobacco: Former Systems developer    Quit date: 01/05/2005  Substance Use Topics  . Alcohol use: No    Alcohol/week: 0.0 oz  . Drug use: No    Allergies  Allergen Reactions  . Levothyroxine Itching    Patient thinks it was the dye in the tablets she was allergic to, she feels like "something is crawling under my skin"  . Lopressor [Metoprolol Tartrate] Cough    Fatigue   . Metformin And Related Diarrhea  . Norvasc [Amlodipine Besylate] Palpitations    "made my heart race"    Current Outpatient Medications  Medication Sig Dispense Refill  .  ACCU-CHEK FASTCLIX LANCETS MISC   3  . ACCU-CHEK GUIDE test strip   3  . albuterol (PROAIR HFA) 108 (90 BASE) MCG/ACT inhaler Inhale 2 puffs into the lungs every 6 (six) hours as needed. For coughing and/or wheezing    . ASPIRIN ADULT LOW STRENGTH 81 MG EC tablet TAKE 1 TABLET BY MOUTH ONCE DAILY 30 tablet 10  . betamethasone valerate ointment (VALISONE) 0.1 % Apply a pea sized amount topically BID x 1-2 weeks as needed 15 g 0  . canagliflozin (INVOKANA) 100 MG TABS tablet Take 100 mg by mouth daily before breakfast.    . carvedilol (COREG) 3.125 MG tablet Take 3.125 mg by mouth 2 (two) times daily with a meal.   3  . carvedilol (COREG) 3.125 MG tablet TAKE  1 TABLET (3.125 MG TOTAL) BY MOUTH 2 (TWO) TIMES DAILY. 180 tablet 3  . clopidogrel (PLAVIX) 75 MG tablet Take 1 tablet (75 mg total) by mouth daily. 30 tablet 11  . CRESTOR 20 MG tablet TAKE 1 TABLET BY MOUTH DAILY. 30 tablet 6  . diclofenac (VOLTAREN) 75 MG EC tablet TAKE 1 TABLET (75 MG TOTAL) BY MOUTH 2 (TWO) TIMES DAILY WITH A MEAL. 60 tablet 5  . diltiazem (CARDIZEM) 120 MG tablet TAKE 1 TABLET (120 MG TOTAL) BY MOUTH 3 (THREE) TIMES DAILY. 90 tablet 11  . ezetimibe (ZETIA) 10 MG tablet TAKE 1 TABLET BY MOUTH DAILY. 30 tablet 10  . fexofenadine (ALLEGRA) 180 MG tablet Take 180 mg by mouth daily.     Marland Kitchen HYDROcodone-acetaminophen (NORCO/VICODIN) 5-325 MG tablet One tablet by mouth every six hours as needed for pain. 56 tablet 0  . irbesartan-hydrochlorothiazide (AVALIDE) 150-12.5 MG tablet Take 1 tablet by mouth daily. 90 tablet 4  . JANUVIA 100 MG tablet Take 100 mg by mouth daily.  4  . levothyroxine (SYNTHROID, LEVOTHROID) 88 MCG tablet Take 88 mcg by mouth daily.  4  . potassium chloride (K-DUR) 10 MEQ tablet Take 2 tablets (20 mEq total) by mouth daily. (Patient taking differently: Take 10 mEq by mouth 2 (two) times daily. ) 60 tablet 11  . potassium chloride (K-DUR,KLOR-CON) 10 MEQ tablet TAKE 2 TABLETS BY MOUTH ONCE DAILY 60 tablet 10  . rosuvastatin (CRESTOR) 40 MG tablet   3  . thyroid (ARMOUR) 60 MG tablet Take 75 mg by mouth every morning.      No current facility-administered medications for this visit.      Physical Exam  Blood pressure (!) 151/90, pulse 85, height 5' 6.5" (1.689 m), weight 210 lb (95.3 kg), last menstrual period 01/06/2003.  Constitutional: overall normal hygiene, normal nutrition, well developed, normal grooming, normal body habitus. Assistive device:none  Musculoskeletal: gait and station Limp Right, muscle tone and strength are normal, no tremors or atrophy is present.  .  Neurological: coordination overall normal.  Deep tendon reflex/nerve stretch  intact.  Sensation normal.  Cranial nerves II-XII intact.   Skin:   Normal overall no scars, lesions, ulcers or rashes. No psoriasis.  Psychiatric: Alert and oriented x 3.  Recent memory intact, remote memory unclear.  Normal mood and affect. Well groomed.  Good eye contact.  Cardiovascular: overall no swelling, no varicosities, no edema bilaterally, normal temperatures of the legs and arms, no clubbing, cyanosis and good capillary refill.  Lymphatic: palpation is normal.  Right hip is tender to move, abduction 20, adduction 20, internal 15, external 15, flexion 90, extension 5.  NV intact.  Limp right.  All other  systems reviewed and are negative   The patient has been educated about the nature of the problem(s) and counseled on treatment options.  The patient appeared to understand what I have discussed and is in agreement with it.  Encounter Diagnosis  Name Primary?  . Chronic pain of right hip Yes    X-rays were done of the hip on the right, reported separately.  PLAN Call if any problems.  Precautions discussed.  Continue current medications.   Return to clinic 2 weeks   I have reviewed the [facility] web site prior to prescribing narcotic medicine for this patient.  Electronically Signed Sanjuana Kava, MD 1/30/20199:35 AM

## 2017-02-11 MED FILL — ASPIRIN ADULT LOW STRENGTH: 81 | 90 days supply | Qty: 90 | Fill #2

## 2017-02-17 ENCOUNTER — Encounter: Payer: Self-pay | Admitting: Orthopaedic Surgery

## 2017-02-17 ENCOUNTER — Ambulatory Visit: Payer: 59 | Admitting: Orthopaedic Surgery

## 2017-02-17 VITALS — BP 126/81 | HR 87 | Temp 98.1°F | Ht 65.0 in | Wt 210.0 lb

## 2017-02-17 DIAGNOSIS — M25551 Pain in right hip: Secondary | ICD-10-CM | POA: Diagnosis not present

## 2017-02-17 DIAGNOSIS — G8929 Other chronic pain: Secondary | ICD-10-CM | POA: Diagnosis not present

## 2017-02-17 NOTE — Progress Notes (Signed)
Patient Shelley [name], female DOB:02-Dec-1955, 62 y.o. IWP:[id number]  Chief Complaint  Patient presents with  . Hip Pain    right hip     HPI  Shelley [name] is a 62 y.o. female who has chronic right hip pain.  She is stable.  She had increased pain the last few days secondary to the cold rainy days.  She is taking her medicine.  She is active.  She has no new trauma. HPI  Body mass index is 34.95 kg/m.  ROS  Review of Systems  HENT: Negative for congestion.   Respiratory: Negative for cough and shortness of breath.   Cardiovascular: Negative for chest pain and leg swelling.  Endocrine: Positive for cold intolerance.  Musculoskeletal: Positive for arthralgias and gait problem.  Allergic/Immunologic: Positive for environmental allergies.  All other systems reviewed and are negative.   Past Medical History:  Diagnosis Date  . CAD (coronary artery disease)   . Cardiac arrhythmia   . DES exposure in utero   . Diabetes mellitus   . Dyslipidemia   . Hypertension   . MI (myocardial infarction) ([place])   . Shingles   . Thyroid disease     Past Surgical History:  Procedure Laterality Date  . BACK SURGERY  5/99 & 6/99  . CARDIAC CATHETERIZATION  01/12/2002   stent patent  . CARDIAC CATHETERIZATION  09/03/2006   patent stents  . CARDIAC CATHETERIZATION N/A 07/31/2015   Procedure: Left Heart Cath and Coronary Angiography;  Surgeon: Burnell Blanks, MD;  Location: [facility];  Service: Cardiovascular;  Laterality: N/A;  . CARDIAC CATHETERIZATION N/A 07/31/2015   Procedure: Coronary Stent Intervention;  Surgeon: Burnell Blanks, MD;  Location: [facility];  Service: Cardiovascular;  Laterality: N/A;  . CARDIAC CATHETERIZATION N/A 08/02/2015   Procedure: Coronary Stent Intervention;  Surgeon: Leonie Man, MD;  Location: [facility];  Service: Cardiovascular;  Laterality: N/A;  . CORONARY ANGIOPLASTY WITH STENT PLACEMENT  07/17/1998    stent LAD  . CORONARY ANGIOPLASTY WITH STENT PLACEMENT  01/07/2006   stent RCA,LAD stent patent  . CORONARY ANGIOPLASTY WITH STENT PLACEMENT  03/18/2006   stent to mid RCA,prox.RCA and LAD stent patent  . CRYOTHERAPY    . THYROID SURGERY     PT. DENIES/RADIATION  . TUBAL LIGATION      Family History  Problem Relation Age of Onset  . Hypertension Mother   . Thyroid disease Mother   . Esophageal cancer Maternal Aunt   . Prostate cancer Maternal Uncle     Social History Social History   Tobacco Use  . Smoking status: Former Research scientist (life sciences)  . Smokeless tobacco: Former Systems developer    Quit date: 01/05/2005  Substance Use Topics  . Alcohol use: No    Alcohol/week: 0.0 oz  . Drug use: No    Allergies  Allergen Reactions  . Levothyroxine Itching    Patient thinks it was the dye in the tablets she was allergic to, she feels like "something is crawling under my skin"  . Lopressor [Metoprolol Tartrate] Cough    Fatigue   . Metformin And Related Diarrhea  . Norvasc [Amlodipine Besylate] Palpitations    "made my heart race"    Current Outpatient Medications  Medication Sig Dispense Refill  . ACCU-CHEK FASTCLIX LANCETS MISC   3  . ACCU-CHEK GUIDE test strip   3  . albuterol (PROAIR HFA) 108 (90 BASE) MCG/ACT inhaler Inhale 2 puffs into the lungs every  6 (six) hours as needed. For coughing and/or wheezing    . ASPIRIN ADULT LOW STRENGTH 81 MG EC tablet TAKE 1 TABLET BY MOUTH ONCE DAILY 30 tablet 10  . betamethasone valerate ointment (VALISONE) 0.1 % Apply a pea sized amount topically BID x 1-2 weeks as needed 15 g 0  . canagliflozin (INVOKANA) 100 MG TABS tablet Take 100 mg by mouth daily before breakfast.    . carvedilol (COREG) 3.125 MG tablet Take 3.125 mg by mouth 2 (two) times daily with a meal.   3  . clopidogrel (PLAVIX) 75 MG tablet Take 1 tablet (75 mg total) by mouth daily. 30 tablet 11  . CRESTOR 20 MG tablet TAKE 1 TABLET BY MOUTH DAILY. 30 tablet 6  . diclofenac (VOLTAREN) 75 MG EC  tablet TAKE 1 TABLET (75 MG TOTAL) BY MOUTH 2 (TWO) TIMES DAILY WITH A MEAL. 60 tablet 5  . diltiazem (CARDIZEM) 120 MG tablet TAKE 1 TABLET (120 MG TOTAL) BY MOUTH 3 (THREE) TIMES DAILY. 90 tablet 11  . ezetimibe (ZETIA) 10 MG tablet TAKE 1 TABLET BY MOUTH DAILY. 30 tablet 10  . fexofenadine (ALLEGRA) 180 MG tablet Take 180 mg by mouth daily.     Marland Kitchen HYDROcodone-acetaminophen (NORCO/VICODIN) 5-325 MG tablet One tablet by mouth every six hours as needed for pain. 56 tablet 0  . irbesartan-hydrochlorothiazide (AVALIDE) 150-12.5 MG tablet Take 1 tablet by mouth daily. 90 tablet 4  . JANUVIA 100 MG tablet Take 100 mg by mouth daily.  4  . levothyroxine (SYNTHROID, LEVOTHROID) 88 MCG tablet Take 88 mcg by mouth daily.  4  . potassium chloride (K-DUR) 10 MEQ tablet Take 2 tablets (20 mEq total) by mouth daily. (Patient taking differently: Take 10 mEq by mouth 2 (two) times daily. ) 60 tablet 11  . potassium chloride (K-DUR,KLOR-CON) 10 MEQ tablet TAKE 2 TABLETS BY MOUTH ONCE DAILY 60 tablet 10  . rosuvastatin (CRESTOR) 40 MG tablet   3  . thyroid (ARMOUR) 60 MG tablet Take 75 mg by mouth every morning.      No current facility-administered medications for this visit.      Physical Exam  Blood pressure 126/81, pulse 87, temperature 98.1 F (36.7 C), height 5\' 5"  (1.651 m), weight 210 lb (95.3 kg), last menstrual period 01/06/2003.  Constitutional: overall normal hygiene, normal nutrition, well developed, normal grooming, normal body habitus. Assistive device:none  Musculoskeletal: gait and station Limp none, muscle tone and strength are normal, no tremors or atrophy is present.  .  Neurological: coordination overall normal.  Deep tendon reflex/nerve stretch intact.  Sensation normal.  Cranial nerves II-XII intact.   Skin:   Normal overall no scars, lesions, ulcers or rashes. No psoriasis.  Psychiatric: Alert and oriented x 3.  Recent memory intact, remote memory unclear.  Normal mood and  affect. Well groomed.  Good eye contact.  Cardiovascular: overall no swelling, no varicosities, no edema bilaterally, normal temperatures of the legs and arms, no clubbing, cyanosis and good capillary refill.  Lymphatic: palpation is normal.  Right hip has good ROM today, no limp, NV intact.  She has no edema.  All other systems reviewed and are negative   The patient has been educated about the nature of the problem(s) and counseled on treatment options.  The patient appeared to understand what I have discussed and is in agreement with it.  Encounter Diagnosis  Name Primary?  . Chronic pain of right hip Yes    PLAN Call if any  problems.  Precautions discussed.  Continue current medications.   Return to clinic 3 months   Electronically Signed Sanjuana Kava, MD 2/13/201910:16 AM

## 2017-02-19 MED FILL — hydrOXYzine HCL 25 MG TABS: 25 | 30 days supply | Qty: 30 | Fill #0

## 2017-02-26 MED FILL — dilTIAZem HCL 120 MG TABS: 120 | 30 days supply | Qty: 90 | Fill #1

## 2017-02-26 MED FILL — POTASSIUM CL 10 MEQ TAB SA: 10 | 30 days supply | Qty: 60 | Fill #3

## 2017-03-03 DIAGNOSIS — I10 Essential (primary) hypertension: Secondary | ICD-10-CM | POA: Diagnosis not present

## 2017-03-03 DIAGNOSIS — E1165 Type 2 diabetes mellitus with hyperglycemia: Secondary | ICD-10-CM | POA: Diagnosis not present

## 2017-03-03 DIAGNOSIS — E039 Hypothyroidism, unspecified: Secondary | ICD-10-CM | POA: Diagnosis not present

## 2017-03-03 DIAGNOSIS — E7800 Pure hypercholesterolemia, unspecified: Secondary | ICD-10-CM | POA: Diagnosis not present

## 2017-03-03 MED FILL — GLIMEPIRIDE 1 MG TABLET: 1 | 30 days supply | Qty: 30 | Fill #0

## 2017-03-04 MED FILL — DICLOFENAC SODIUM 75 MG TAB: 75 | 30 days supply | Qty: 60 | Fill #5

## 2017-03-04 MED FILL — EZETIMIBE 10 MG TABS: 10 | 30 days supply | Qty: 30 | Fill #5

## 2017-03-12 ENCOUNTER — Encounter: Payer: Self-pay | Admitting: Cardiovascular Disease

## 2017-03-15 ENCOUNTER — Telehealth: Payer: Self-pay | Admitting: *Deleted

## 2017-03-15 MED ORDER — BISOPROLOL FUMARATE 5 MG PO TABS: ORAL_TABLET | ORAL | 1 refills | Status: DC

## 2017-03-15 MED FILL — BISOPROLOL FUMARATE 5 MG TA: 5 | 90 days supply | Qty: 45 | Fill #0

## 2017-03-15 NOTE — Telephone Encounter (Signed)
  Rx sent to [facility]    ----- Message from [place], Generic sent at 03/12/2017 6:43 PM EST -----    Yes, that would be fine. Thank you!  ----- Message -----  From: Sanda Klein, MD  Sent: 03/12/2017 5:24 PM EST  To: Vernard Gambles  Subject: RE: Non-Urgent Medical Question  Hello, Twanda,  We have tried different preparations of beta-blocker in the past.  At one point, I had suggested that we switch to bisoprolol, which is the least likely to cause any issues with bronchospasm/asthma-like response.  Honestly, I cannot remember why we decided to stick with carvedilol.  We can try switching to bisoprolol 2.5 mg daily. Can we call that in for you?  Dr. Loletha Grayer    ----- Message -----   From: Vernard Gambles   Sent: 03/12/2017 8:42 AM EST    To: Sanda Klein, MD  Subject: Non-Urgent Medical Question    Good morning, I am having some issues with SOB. I take Coreg and Diltiazem. I saw that they cause SOB. I have had this issue before when taking a beta blocker. What must I do?

## 2017-03-15 NOTE — Addendum Note (Signed)
Addended by: Alvina Filbert B on: 03/15/2017 02:58 PM   Modules accepted: Orders

## 2017-03-23 MED FILL — hydrOXYzine HCL 25 MG TABS: 25 | 30 days supply | Qty: 30 | Fill #1

## 2017-03-25 MED FILL — DOXEPIN 10 MG CAPSULE: 10 | 30 days supply | Qty: 30 | Fill #1

## 2017-04-02 ENCOUNTER — Other Ambulatory Visit: Payer: Self-pay | Admitting: Orthopaedic Surgery

## 2017-04-02 MED FILL — GLIMEPIRIDE 1 MG TABLET: 1 | 30 days supply | Qty: 30 | Fill #1

## 2017-04-02 MED FILL — EZETIMIBE 10 MG TABLET: 10 | 30 days supply | Qty: 30 | Fill #6

## 2017-04-09 DIAGNOSIS — M1611 Unilateral primary osteoarthritis, right hip: Secondary | ICD-10-CM | POA: Diagnosis not present

## 2017-04-09 MED FILL — predniSONE 10 MG TABS: 10 | 12 days supply | Qty: 21 | Fill #0

## 2017-04-12 MED FILL — LEVOTHYROXINE 88 MCG TABLET: 88 | 90 days supply | Qty: 90 | Fill #1

## 2017-04-12 MED FILL — CLOPIDOGREL 75 MG TABLET: 75 | 90 days supply | Qty: 90 | Fill #3

## 2017-04-22 DIAGNOSIS — R768 Other specified abnormal immunological findings in serum: Secondary | ICD-10-CM | POA: Diagnosis not present

## 2017-04-22 DIAGNOSIS — R682 Dry mouth, unspecified: Secondary | ICD-10-CM | POA: Diagnosis not present

## 2017-04-22 DIAGNOSIS — M25551 Pain in right hip: Secondary | ICD-10-CM | POA: Diagnosis not present

## 2017-04-22 DIAGNOSIS — M1611 Unilateral primary osteoarthritis, right hip: Secondary | ICD-10-CM | POA: Diagnosis not present

## 2017-04-23 MED FILL — hydrOXYzine HCL 25 MG TABS: 25 | 30 days supply | Qty: 30 | Fill #2

## 2017-04-27 MED FILL — POTASSIUM CL 10 MEQ TAB SA: 10 | 30 days supply | Qty: 60 | Fill #4

## 2017-05-03 MED FILL — DICLOFENAC SODIUM 75 MG TAB: 75 | 30 days supply | Qty: 60 | Fill #0

## 2017-05-05 MED FILL — GLIMEPIRIDE 1 MG TABLET: 1 | 30 days supply | Qty: 30 | Fill #2

## 2017-05-05 MED FILL — EZETIMIBE 10 MG TABLET: 10 | 30 days supply | Qty: 30 | Fill #7

## 2017-05-05 MED FILL — IRBESARTAN-HCTZ 150-12.5 MG: 150-12.5 | 90 days supply | Qty: 90 | Fill #3

## 2017-05-11 MED FILL — ROSUVASTATIN CALCIUM 40 MG: 40 | 90 days supply | Qty: 90 | Fill #3

## 2017-05-11 MED FILL — ASPIRIN ADULT LOW STRENGTH: 81 | 60 days supply | Qty: 60 | Fill #3

## 2017-05-18 MED FILL — ACCU-CHEK GUIDE STRP: 90 days supply | Qty: 200 | Fill #0

## 2017-05-19 ENCOUNTER — Ambulatory Visit: Payer: Self-pay | Admitting: Orthopaedic Surgery

## 2017-05-21 MED FILL — dilTIAZem HCL 120 MG TABS: 120 | 30 days supply | Qty: 90 | Fill #2

## 2017-05-26 ENCOUNTER — Encounter: Payer: Self-pay | Admitting: Orthopaedic Surgery

## 2017-05-26 ENCOUNTER — Ambulatory Visit: Payer: 59 | Admitting: Orthopaedic Surgery

## 2017-05-26 VITALS — BP 125/76 | HR 88 | Ht 65.0 in | Wt 214.0 lb

## 2017-05-26 DIAGNOSIS — G8929 Other chronic pain: Secondary | ICD-10-CM | POA: Diagnosis not present

## 2017-05-26 DIAGNOSIS — M25551 Pain in right hip: Secondary | ICD-10-CM

## 2017-05-26 NOTE — Progress Notes (Signed)
Patient KG:YJEHUDJ Shelley [name], female DOB:30-Oct-1955, 62 y.o. [id number]  Chief Complaint  Patient presents with  . Follow-up    Right Hip Pain    HPI  Shelley [name] is a 62 y.o. female who has chronic right hip pain.  She has no new trauma.  She is walking a lot for exercise and has pain after the end of the walk.  She is taking her medicine.  She tries to be active. HPI  Body mass index is 35.61 kg/m.  ROS  Review of Systems  HENT: Negative for congestion.   Respiratory: Negative for cough and shortness of breath.   Cardiovascular: Negative for chest pain and leg swelling.  Endocrine: Positive for cold intolerance.  Musculoskeletal: Positive for arthralgias and gait problem.  Allergic/Immunologic: Positive for environmental allergies.  All other systems reviewed and are negative.   Past Medical History:  Diagnosis Date  . CAD (coronary artery disease)   . Cardiac arrhythmia   . DES exposure in utero   . Diabetes mellitus   . Dyslipidemia   . Hypertension   . MI (myocardial infarction) ([place])   . Shingles   . Thyroid disease     Past Surgical History:  Procedure Laterality Date  . BACK SURGERY  5/99 & 6/99  . CARDIAC CATHETERIZATION  01/12/2002   stent patent  . CARDIAC CATHETERIZATION  09/03/2006   patent stents  . CARDIAC CATHETERIZATION N/A 07/31/2015   Procedure: Left Heart Cath and Coronary Angiography;  Surgeon: Burnell Blanks, MD;  Location: [facility];  Service: Cardiovascular;  Laterality: N/A;  . CARDIAC CATHETERIZATION N/A 07/31/2015   Procedure: Coronary Stent Intervention;  Surgeon: Burnell Blanks, MD;  Location: [facility];  Service: Cardiovascular;  Laterality: N/A;  . CARDIAC CATHETERIZATION N/A 08/02/2015   Procedure: Coronary Stent Intervention;  Surgeon: Leonie Man, MD;  Location: [facility];  Service: Cardiovascular;  Laterality: N/A;  . CORONARY ANGIOPLASTY WITH STENT PLACEMENT  07/17/1998   stent LAD  . CORONARY ANGIOPLASTY WITH STENT PLACEMENT  01/07/2006   stent RCA,LAD stent patent  . CORONARY ANGIOPLASTY WITH STENT PLACEMENT  03/18/2006   stent to mid RCA,prox.RCA and LAD stent patent  . CRYOTHERAPY    . THYROID SURGERY     PT. DENIES/RADIATION  . TUBAL LIGATION      Family History  Problem Relation Age of Onset  . Hypertension Mother   . Thyroid disease Mother   . Esophageal cancer Maternal Aunt   . Prostate cancer Maternal Uncle     Social History Social History   Tobacco Use  . Smoking status: Former Research scientist (life sciences)  . Smokeless tobacco: Former Systems developer    Quit date: 01/05/2005  Substance Use Topics  . Alcohol use: No    Alcohol/week: 0.0 oz  . Drug use: No    Allergies  Allergen Reactions  . Levothyroxine Itching    Patient thinks it was the dye in the tablets she was allergic to, she feels like "something is crawling under my skin"  . Coreg [Carvedilol]     Sob   . Lopressor [Metoprolol Tartrate] Cough    Fatigue   . Metformin And Related Diarrhea  . Norvasc [Amlodipine Besylate] Palpitations    "made my heart race"    Current Outpatient Medications  Medication Sig Dispense Refill  . ACCU-CHEK FASTCLIX LANCETS MISC   3  . ACCU-CHEK GUIDE test strip   3  . albuterol (PROAIR HFA) 108 (90 BASE) MCG/ACT  inhaler Inhale 2 puffs into the lungs every 6 (six) hours as needed. For coughing and/or wheezing    . ASPIRIN ADULT LOW STRENGTH 81 MG EC tablet TAKE 1 TABLET BY MOUTH ONCE DAILY 30 tablet 10  . betamethasone valerate ointment (VALISONE) 0.1 % Apply a pea sized amount topically BID x 1-2 weeks as needed 15 g 0  . bisoprolol (ZEBETA) 5 MG tablet Take 1/2 tablet by mouth daily 45 tablet 1  . canagliflozin (INVOKANA) 100 MG TABS tablet Take 100 mg by mouth daily before breakfast.    . clopidogrel (PLAVIX) 75 MG tablet Take 1 tablet (75 mg total) by mouth daily. 30 tablet 11  . CRESTOR 20 MG tablet TAKE 1 TABLET BY MOUTH DAILY. 30 tablet 6  . diclofenac  (VOLTAREN) 75 MG EC tablet TAKE 1 TABLET (75 MG TOTAL) BY MOUTH 2 (TWO) TIMES DAILY WITH A MEAL. 60 tablet 5  . diltiazem (CARDIZEM) 120 MG tablet TAKE 1 TABLET (120 MG TOTAL) BY MOUTH 3 (THREE) TIMES DAILY. 90 tablet 11  . ezetimibe (ZETIA) 10 MG tablet TAKE 1 TABLET BY MOUTH DAILY. 30 tablet 10  . fexofenadine (ALLEGRA) 180 MG tablet Take 180 mg by mouth daily.     Marland Kitchen HYDROcodone-acetaminophen (NORCO/VICODIN) 5-325 MG tablet One tablet by mouth every six hours as needed for pain. 56 tablet 0  . irbesartan-hydrochlorothiazide (AVALIDE) 150-12.5 MG tablet Take 1 tablet by mouth daily. 90 tablet 4  . JANUVIA 100 MG tablet Take 100 mg by mouth daily.  4  . levothyroxine (SYNTHROID, LEVOTHROID) 88 MCG tablet Take 88 mcg by mouth daily.  4  . potassium chloride (K-DUR) 10 MEQ tablet Take 2 tablets (20 mEq total) by mouth daily. (Patient taking differently: Take 10 mEq by mouth 2 (two) times daily. ) 60 tablet 11  . potassium chloride (K-DUR,KLOR-CON) 10 MEQ tablet TAKE 2 TABLETS BY MOUTH ONCE DAILY 60 tablet 10  . rosuvastatin (CRESTOR) 40 MG tablet   3  . thyroid (ARMOUR) 60 MG tablet Take 75 mg by mouth every morning.      No current facility-administered medications for this visit.      Physical Exam  Blood pressure 125/76, pulse 88, height 5\' 5"  (1.651 m), weight 214 lb (97.1 kg), last menstrual period 01/06/2003.  Constitutional: overall normal hygiene, normal nutrition, well developed, normal grooming, normal body habitus. Assistive device:none  Musculoskeletal: gait and station Limp right, muscle tone and strength are normal, no tremors or atrophy is present.  .  Neurological: coordination overall normal.  Deep tendon reflex/nerve stretch intact.  Sensation normal.  Cranial nerves II-XII intact.   Skin:   Normal overall no scars, lesions, ulcers or rashes. No psoriasis.  Psychiatric: Alert and oriented x 3.  Recent memory intact, remote memory unclear.  Normal mood and affect. Well  groomed.  Good eye contact.  Cardiovascular: overall no swelling, no varicosities, no edema bilaterally, normal temperatures of the legs and arms, no clubbing, cyanosis and good capillary refill.  Lymphatic: palpation is normal.  The right lower extremity is examined:  Inspection:  Thigh:  Non-tender and no defects  Knee has swelling 1+ effusion.                        Joint tenderness is present                        Patient is tender over the medial joint line  Lower  Leg:  Has normal appearance and no tenderness or defects  Ankle:  Non-tender and no defects  Foot:  Non-tender and no defects Range of Motion:  Knee:  Range of motion is: 0-105                        Crepitus is  present  Ankle:  Range of motion is normal. Strength and Tone:  The right lower extremity has normal strength and tone. Stability:  Knee:  The knee is stable.  Ankle:  The ankle is stable. All other systems reviewed and are negative   The patient has been educated about the nature of the problem(s) and counseled on treatment options.  The patient appeared to understand what I have discussed and is in agreement with it.  Encounter Diagnosis  Name Primary?  . Chronic pain of right hip Yes    PLAN Call if any problems.  Precautions discussed.  Continue current medications.   Return to clinic 3 months   Electronically Signed Sanjuana Kava, MD 5/22/20199:44 AM

## 2017-05-28 MED FILL — hydrOXYzine HCL 25 MG TABS: 25 | 30 days supply | Qty: 30 | Fill #3

## 2017-06-01 DIAGNOSIS — H66001 Acute suppurative otitis media without spontaneous rupture of ear drum, right ear: Secondary | ICD-10-CM | POA: Diagnosis not present

## 2017-06-01 DIAGNOSIS — J309 Allergic rhinitis, unspecified: Secondary | ICD-10-CM | POA: Diagnosis not present

## 2017-06-01 DIAGNOSIS — H10022 Other mucopurulent conjunctivitis, left eye: Secondary | ICD-10-CM | POA: Diagnosis not present

## 2017-06-01 MED FILL — AZITHROMYCIN 250 MG TABLET: 250 | 5 days supply | Qty: 6 | Fill #0

## 2017-06-01 MED FILL — ERYTHROMYCIN 0.5% EYE OINT: 5 | 7 days supply | Qty: 4 | Fill #0

## 2017-06-01 MED FILL — BENZONATATE 100 MG CAPS: 100 | 20 days supply | Qty: 60 | Fill #0

## 2017-06-02 ENCOUNTER — Ambulatory Visit: Payer: 59 | Admitting: Pulmonary Disease

## 2017-06-02 ENCOUNTER — Encounter: Payer: Self-pay | Admitting: Pulmonary Disease

## 2017-06-02 ENCOUNTER — Ambulatory Visit (INDEPENDENT_AMBULATORY_CARE_PROVIDER_SITE_OTHER)
Admission: RE | Admit: 2017-06-02 | Discharge: 2017-06-02 | Disposition: A | Discharge status: Home / Self Care | Payer: 59 | Source: Ambulatory Visit | Attending: Pulmonary Disease | Admitting: Pulmonary Disease

## 2017-06-02 VITALS — BP 116/78 | HR 73 | Ht 65.0 in | Wt 211.8 lb

## 2017-06-02 DIAGNOSIS — R0602 Shortness of breath: Secondary | ICD-10-CM

## 2017-06-02 DIAGNOSIS — R05 Cough: Secondary | ICD-10-CM | POA: Diagnosis not present

## 2017-06-02 LAB — NITRIC OXIDE: Nitric Oxide: 59

## 2017-06-02 MED ORDER — MONTELUKAST SODIUM 10 MG PO TABS: 10.0000 mg | ORAL_TABLET | Freq: Every day | ORAL | 5 refills | Status: DC

## 2017-06-02 MED ORDER — BUDESONIDE-FORMOTEROL FUMARATE 160-4.5 MCG/ACT IN AERO: 2.0000 | INHALATION_SPRAY | Freq: Two times a day (BID) | RESPIRATORY_TRACT | 6 refills | Status: DC

## 2017-06-02 MED ORDER — ALBUTEROL SULFATE HFA 108 (90 BASE) MCG/ACT IN AERS: 2.0000 | INHALATION_SPRAY | Freq: Four times a day (QID) | RESPIRATORY_TRACT | 5 refills | Status: DC | PRN

## 2017-06-02 MED FILL — DICLOFENAC SODIUM 75 MG TAB: 75 | 30 days supply | Qty: 60 | Fill #1

## 2017-06-02 MED FILL — VENTOLIN HFA 90 MCG INHALER: 108 (90 BAS | 25 days supply | Qty: 18 | Fill #0

## 2017-06-02 MED FILL — GLIMEPIRIDE 1 MG TABLET: 1 | 30 days supply | Qty: 30 | Fill #3

## 2017-06-02 MED FILL — MONTELUKAST SOD 10 MG TAB: 10 | 30 days supply | Qty: 30 | Fill #0

## 2017-06-02 MED FILL — EZETIMIBE 10 MG TABLET: 10 | 30 days supply | Qty: 30 | Fill #8

## 2017-06-02 NOTE — Patient Instructions (Signed)
Chest xray today Symbicort two puffs twice per day, and rinse mouth after each use Ventolin two puffs every 6 hours as needed for cough, wheeze, or chest congestion Singulair 10 mg pill nightly  Follow up in 3 weeks with Dr. Halford Chessman or Nurse Practitioner

## 2017-06-02 NOTE — Progress Notes (Signed)
[name]  Chief Complaint  Patient presents with  . pulm consult    Pt self referral for SOB. Pt has increase of SOB with exertion, wheezing, productive cough-brown, light red blood for last 5 days.     Vital signs: BP 116/78 (BP Location: Left Arm, Cuff Size: Normal)   Pulse 73   Ht 5\' 5"  (1.651 m)   Wt 211 lb 12.8 oz (96.1 kg)   LMP 01/06/2003 (Approximate)   SpO2 95%   BMI 35.25 kg/m   History of Present Illness: Shelley [name] is a 62 y.o. female former smoker with dyspnea.  She has noticed trouble with her breathing for years.  She was seen by an allergist years ago.  She was never told she had asthma, but would frequently get nebulizer treatments in allergists office.  She has cough, wheeze, and chest congestion.  Can bring up clear to pink/brown sputum.  She feels her activity is limited by her breathing.  She has hx of CAD.  She was seen by cardiology and had beta blocker adjust, but no improvement in breathing.  She was started on zpak few days ago for bronchitis.  She was on prednisone a few ago for hip pain, and this helped her breathing.  She is from [place].  She works in [place] with [employer].  No animal/bird exposure.  No hx of pneumonia or TB.  No hx of thromboembolic disease.  She quit smoking several years ago.  She had CT chest in 2017 that showed emphysema.  She doesn't feel like she has any issues with snoring or sleep apnea.  Physical Exam:  General - pleasant Eyes - pupils reactive, injected sclera b/l ENT - no sinus tenderness, no oral exudate, no LAN Cardiac - regular, no murmur Chest - no wheeze, rales Abd - soft, non tender Ext - no edema Skin - no rashes Neuro - normal strength Psych - normal mood  Discussion: She has history of smoking.  She has prior history of allergies and possible asthma.  Her previous CT chest showed emphysema.  She has airflow obstruction on spirometry and  increased readings on FeNO.  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 - finish course of zithromax from PCP - add symbicort, singulair - prn ventolin - defer prednisone for now - chest xray today - will need to arrange for full PFT when more stable   Patient Instructions  Chest xray today Symbicort two puffs twice per day, and rinse mouth after each use Ventolin two puffs every 6 hours as needed for cough, wheeze, or chest congestion Singulair 10 mg pill nightly  Follow up in 3 weeks with Dr. Halford Chessman or Nurse Practitioner    Chesley Mires, MD [place] 06/02/2017, 4:15 PM  Flow Sheet  Pulmonary tests: CT angio chest 08/21/15 >> centrilobular emphysema, 5 mm nodule LLL stable since 2014 FeNO 06/02/17 >> 59 Spirometry 06/02/17 >> FEV1 1.18 (55%), FEV1% 56  Cardiac tests: Echo 08/26/15 >> EF 60 to 65%, grade 1 DD  Review of Systems: Constitutional: Negative for fever and unexpected weight change.  HENT: Positive for congestion and voice change. Negative for dental problem, ear pain, nosebleeds, postnasal drip, rhinorrhea, sinus pressure, sneezing, sore throat and trouble swallowing.   Eyes: Positive for discharge, redness and itching.  Respiratory: Positive for cough and shortness of breath. Negative for chest tightness and wheezing.   Cardiovascular: Negative for palpitations  and leg swelling.  Gastrointestinal: Negative for nausea and vomiting.  Genitourinary: Negative for dysuria.  Musculoskeletal: Negative for joint swelling.  Skin: Negative for rash.  Allergic/Immunologic: Positive for environmental allergies. Negative for food allergies and immunocompromised state.  Neurological: Negative for headaches.  Hematological: Does not bruise/bleed easily.  Psychiatric/Behavioral: Negative for dysphoric mood. The patient is not nervous/anxious.    Past Medical History: She  has a past medical history of CAD  (coronary artery disease), Cardiac arrhythmia, DES exposure in utero, Diabetes mellitus, Dyslipidemia, Hypertension, MI (myocardial infarction) ([place]), Shingles, and Thyroid disease.  Past Surgical History: She  has a past surgical history that includes Back surgery (5/99 & 6/99); Coronary angioplasty with stent (07/17/1998); Cardiac catheterization (01/12/2002); Coronary angioplasty with stent (01/07/2006); Coronary angioplasty with stent (03/18/2006); Cardiac catheterization (09/03/2006); Thyroid surgery; Tubal ligation; Cryotherapy; Cardiac catheterization (N/A, 07/31/2015); Cardiac catheterization (N/A, 07/31/2015); and Cardiac catheterization (N/A, 08/02/2015).  Family History: Her family history includes Esophageal cancer in her maternal aunt; Hypertension in her mother; Prostate cancer in her maternal uncle; Thyroid disease in her mother.  Social History: She  reports that she quit smoking about 11 years ago. Her smoking use included cigarettes. She has a 17.00 pack-year smoking history. She quit smokeless tobacco use about 12 years ago. She reports that she does not drink alcohol or use drugs.  Medications: Allergies as of 06/02/2017      Reactions   Levothyroxine Itching   Patient thinks it was the dye in the tablets she was allergic to, she feels like "something is crawling under my skin"   Coreg [carvedilol]    Sob    Lopressor [metoprolol Tartrate] Cough   Fatigue   Metformin And Related Diarrhea   Norvasc [amlodipine Besylate] Palpitations   "made my heart race"      Medication List        Accurate as of 06/02/17  4:15 PM. Always use your most recent med list.          ACCU-CHEK FASTCLIX LANCETS Misc   ACCU-CHEK GUIDE test strip Generic drug:  glucose blood   ASPIRIN ADULT LOW STRENGTH 81 MG EC tablet Generic drug:  aspirin TAKE 1 TABLET BY MOUTH ONCE DAILY   betamethasone valerate ointment 0.1 % Commonly known as:  VALISONE Apply a pea sized amount topically BID x 1-2  weeks as needed   bisoprolol 5 MG tablet Commonly known as:  ZEBETA Take 1/2 tablet by mouth daily   budesonide-formoterol 160-4.5 MCG/ACT inhaler Commonly known as:  SYMBICORT Inhale 2 puffs into the lungs 2 (two) times daily.   clopidogrel 75 MG tablet Commonly known as:  PLAVIX Take 1 tablet (75 mg total) by mouth daily.   CRESTOR 20 MG tablet Generic drug:  rosuvastatin TAKE 1 TABLET BY MOUTH DAILY.   rosuvastatin 40 MG tablet Commonly known as:  CRESTOR   diclofenac 75 MG EC tablet Commonly known as:  VOLTAREN TAKE 1 TABLET (75 MG TOTAL) BY MOUTH 2 (TWO) TIMES DAILY WITH A MEAL.   diltiazem 120 MG tablet Commonly known as:  CARDIZEM TAKE 1 TABLET (120 MG TOTAL) BY MOUTH 3 (THREE) TIMES DAILY.   ezetimibe 10 MG tablet Commonly known as:  ZETIA TAKE 1 TABLET BY MOUTH DAILY.   fexofenadine 180 MG tablet Commonly known as:  ALLEGRA Take 180 mg by mouth daily.   HYDROcodone-acetaminophen 5-325 MG tablet Commonly known as:  NORCO/VICODIN One tablet by mouth every six hours as needed for pain.   INVOKANA 100 MG Tabs  tablet Generic drug:  canagliflozin Take 100 mg by mouth daily before breakfast.   irbesartan-hydrochlorothiazide 150-12.5 MG tablet Commonly known as:  AVALIDE Take 1 tablet by mouth daily.   JANUVIA 100 MG tablet Generic drug:  sitaGLIPtin Take 100 mg by mouth daily.   levothyroxine 88 MCG tablet Commonly known as:  SYNTHROID, LEVOTHROID Take 88 mcg by mouth daily.   montelukast 10 MG tablet Commonly known as:  SINGULAIR Take 1 tablet (10 mg total) by mouth at bedtime.   potassium chloride 10 MEQ tablet Commonly known as:  K-DUR Take 2 tablets (20 mEq total) by mouth daily.   potassium chloride 10 MEQ tablet Commonly known as:  K-DUR,KLOR-CON TAKE 2 TABLETS BY MOUTH ONCE DAILY   PROAIR HFA 108 (90 Base) MCG/ACT inhaler Generic drug:  albuterol Inhale 2 puffs into the lungs every 6 (six) hours as needed. For coughing and/or wheezing     albuterol 108 (90 Base) MCG/ACT inhaler Commonly known as:  VENTOLIN HFA Inhale 2 puffs into the lungs every 6 (six) hours as needed for wheezing or shortness of breath.   thyroid 60 MG tablet Commonly known as:  ARMOUR Take 75 mg by mouth every morning.

## 2017-06-02 NOTE — Progress Notes (Signed)
   Subjective:    Patient ID: Shelley [name], female    DOB: 1955-02-11, 62 y.o.   MRN: [id number]  HPI    Review of Systems  Constitutional: Negative for fever and unexpected weight change.  HENT: Positive for congestion and voice change. Negative for dental problem, ear pain, nosebleeds, postnasal drip, rhinorrhea, sinus pressure, sneezing, sore throat and trouble swallowing.   Eyes: Positive for discharge, redness and itching.  Respiratory: Positive for cough and shortness of breath. Negative for chest tightness and wheezing.   Cardiovascular: Negative for palpitations and leg swelling.  Gastrointestinal: Negative for nausea and vomiting.  Genitourinary: Negative for dysuria.  Musculoskeletal: Negative for joint swelling.  Skin: Negative for rash.  Allergic/Immunologic: Positive for environmental allergies. Negative for food allergies and immunocompromised state.  Neurological: Negative for headaches.  Hematological: Does not bruise/bleed easily.  Psychiatric/Behavioral: Negative for dysphoric mood. The patient is not nervous/anxious.        Objective:   Physical Exam        Assessment & Plan:

## 2017-06-11 ENCOUNTER — Telehealth: Payer: Self-pay | Admitting: Pulmonary Disease

## 2017-06-11 NOTE — Telephone Encounter (Signed)
Dg Chest 2 View  Result Date: 06/03/2017 CLINICAL DATA:  Dyspnea on exertion and cough EXAM: CHEST - 2 VIEW COMPARISON:  07/31/2015 FINDINGS: The heart is borderline enlarged. Normal vascularity. Bibasilar subsegmental atelectasis. No pneumothorax or pleural effusion. No definite consolidation. Spondylitic changes in the thoracic spine are noted. IMPRESSION: Borderline cardiomegaly without decompensation. Bibasilar atelectasis. Electronically Signed   By: Marybelle Killings M.D.   On: 06/03/2017 08:06    Please let her know her CXR looked okay.  Will review in more detail at next visit.

## 2017-06-11 NOTE — Telephone Encounter (Signed)
Left voice mail on machine for patient to return phone call back regarding results. X1  

## 2017-06-17 NOTE — Telephone Encounter (Signed)
Attempted to call patient today regarding results. I did not receive an answer at time of call. I have left a voicemail message for pt to return call. X2  

## 2017-06-18 MED FILL — GLIMEPIRIDE 2 MG TABLET: 2 | 30 days supply | Qty: 30 | Fill #0

## 2017-06-18 NOTE — Telephone Encounter (Signed)
Attempted to call patient today regarding results. I did not receive an answer at time of call. I have left a voicemail message for pt to return call. X3 Mailed letter to pt to contact our office regarding results. Mailed letter today. Nothing further needed.

## 2017-06-19 DIAGNOSIS — Z0000 Encounter for general adult medical examination without abnormal findings: Secondary | ICD-10-CM | POA: Diagnosis not present

## 2017-06-19 DIAGNOSIS — E039 Hypothyroidism, unspecified: Secondary | ICD-10-CM | POA: Diagnosis not present

## 2017-06-19 DIAGNOSIS — I10 Essential (primary) hypertension: Secondary | ICD-10-CM | POA: Diagnosis not present

## 2017-06-19 DIAGNOSIS — E118 Type 2 diabetes mellitus with unspecified complications: Secondary | ICD-10-CM | POA: Diagnosis not present

## 2017-06-19 DIAGNOSIS — E785 Hyperlipidemia, unspecified: Secondary | ICD-10-CM | POA: Diagnosis not present

## 2017-06-22 DIAGNOSIS — E668 Other obesity: Secondary | ICD-10-CM | POA: Diagnosis not present

## 2017-06-22 DIAGNOSIS — Z7901 Long term (current) use of anticoagulants: Secondary | ICD-10-CM | POA: Diagnosis not present

## 2017-06-22 DIAGNOSIS — R0600 Dyspnea, unspecified: Secondary | ICD-10-CM | POA: Diagnosis not present

## 2017-06-22 DIAGNOSIS — Z1231 Encounter for screening mammogram for malignant neoplasm of breast: Secondary | ICD-10-CM | POA: Diagnosis not present

## 2017-06-22 DIAGNOSIS — I2510 Atherosclerotic heart disease of native coronary artery without angina pectoris: Secondary | ICD-10-CM | POA: Diagnosis not present

## 2017-06-22 DIAGNOSIS — Z79899 Other long term (current) drug therapy: Secondary | ICD-10-CM | POA: Diagnosis not present

## 2017-06-22 DIAGNOSIS — Z0000 Encounter for general adult medical examination without abnormal findings: Secondary | ICD-10-CM | POA: Diagnosis not present

## 2017-06-22 DIAGNOSIS — Z6835 Body mass index (BMI) 35.0-35.9, adult: Secondary | ICD-10-CM | POA: Diagnosis not present

## 2017-06-22 DIAGNOSIS — I10 Essential (primary) hypertension: Secondary | ICD-10-CM | POA: Diagnosis not present

## 2017-06-23 ENCOUNTER — Encounter: Payer: Self-pay | Admitting: Adult Health

## 2017-06-23 ENCOUNTER — Ambulatory Visit: Payer: 59 | Admitting: Adult Health

## 2017-06-23 DIAGNOSIS — J309 Allergic rhinitis, unspecified: Secondary | ICD-10-CM | POA: Insufficient documentation

## 2017-06-23 DIAGNOSIS — J449 Chronic obstructive pulmonary disease, unspecified: Secondary | ICD-10-CM | POA: Diagnosis not present

## 2017-06-23 DIAGNOSIS — J3089 Other allergic rhinitis: Secondary | ICD-10-CM

## 2017-06-23 LAB — NITRIC OXIDE: Nitric Oxide: 69

## 2017-06-23 NOTE — Patient Instructions (Addendum)
Continue on Symbicort 2 puffs twice daily, rinse after use Continue on Singulair daily Continue on Allegra daily as needed for drainage Ventolin inhaler 2 puffs every 6 hours as needed for wheezing  Follow-up with Dr. Halford Chessman in 2 months with PFTs and as needed

## 2017-06-23 NOTE — Progress Notes (Signed)
@Patient  ID: Shelley [name], female    DOB: September 06, 1955, 62 y.o.   MRN: [id number]  Chief Complaint  Patient presents with  . Follow-up    Asthma     Referring provider: [name], MD  HPI: 62 year old female former smoker (2008 )  seen for pulmonary consult Jun 02, 2017 for shortness of breath and cough found to have moderate COPD with suspected asthma and emphysema Past medical history significant for coronary disease  TEST  Pulmonary tests: CT angio chest 08/21/15 >> centrilobular emphysema, 5 mm nodule LLL stable since 2014 FeNO 06/02/17 >> 59 Spirometry 06/02/17 >> FEV1 1.18 (55%), FEV1% 56   Cardiac tests: Echo 08/26/15 >> EF 60 to 65%, grade 1 DD  06/23/2017 Follow up : COPD/Asthma  Patient returns for a 3-week follow-up.  Patient was seen last visit for pulmonary consult for ongoing shortness of breath and cough.  Patient was a former smoker.  Spirometry showed moderate COPD with an FEV1 at 55% and a ratio at 56.  Patient's exhaled nitric oxide testing was elevated at 59.  Previous CT chest in 2017 showed emphysema.  Patient was started on Symbicort and Singulair for suspected underlying COPD with asthma.  Chest x-ray showed bibasilar atelectasis.  Since last visit patient is feeling some better.  Cough has resolved.  Says she still gets some shortness of breath with heavy activity.  Denies any wheezing or increased albuterol use. FENO today is 69 ppb   Works fulltime at [employer] . Tries to walk more at work. Bows 2 nights a week. Just recently got treadmill.   Patient is main fears that she does not want to be on oxygen.  Patient education was given.  Allergies  Allergen Reactions  . Levothyroxine Itching    Patient thinks it was the dye in the tablets she was allergic to, she feels like "something is crawling under my skin"  . Coreg [Carvedilol]     Sob   . Lopressor [Metoprolol Tartrate] Cough    Fatigue   . Metformin And Related Diarrhea  . Norvasc [Amlodipine  Besylate] Palpitations    "made my heart race"    Immunization History  Administered Date(s) Administered  . Influenza Split 10/03/2016    Past Medical History:  Diagnosis Date  . CAD (coronary artery disease)   . Cardiac arrhythmia   . DES exposure in utero   . Diabetes mellitus   . Dyslipidemia   . Hypertension   . MI (myocardial infarction) ([place])   . Shingles   . Thyroid disease     Tobacco History: Social History   Tobacco Use  Smoking Status Former Smoker  . Packs/day: 0.50  . Years: 34.00  . Pack years: 17.00  . Types: Cigarettes  . Last attempt to quit: 2008  . Years since quitting: 11.4  Smokeless Tobacco Former Systems developer  . Quit date: 01/05/2005   Counseling given: Not Answered   Outpatient Encounter Medications as of 06/23/2017  Medication Sig  . ACCU-CHEK FASTCLIX LANCETS MISC   . ACCU-CHEK GUIDE test strip   . albuterol (VENTOLIN HFA) 108 (90 Base) MCG/ACT inhaler Inhale 2 puffs into the lungs every 6 (six) hours as needed for wheezing or shortness of breath.  . ASPIRIN ADULT LOW STRENGTH 81 MG EC tablet TAKE 1 TABLET BY MOUTH ONCE DAILY  . betamethasone valerate ointment (VALISONE) 0.1 % Apply a pea sized amount topically BID x 1-2 weeks as needed  . bisoprolol (ZEBETA) 5 MG tablet Take 1/2  tablet by mouth daily  . budesonide-formoterol (SYMBICORT) 160-4.5 MCG/ACT inhaler Inhale 2 puffs into the lungs 2 (two) times daily.  . clopidogrel (PLAVIX) 75 MG tablet Take 1 tablet (75 mg total) by mouth daily.  . diclofenac (VOLTAREN) 75 MG EC tablet TAKE 1 TABLET (75 MG TOTAL) BY MOUTH 2 (TWO) TIMES DAILY WITH A MEAL.  Marland Kitchen diltiazem (CARDIZEM) 120 MG tablet TAKE 1 TABLET (120 MG TOTAL) BY MOUTH 3 (THREE) TIMES DAILY.  Marland Kitchen ezetimibe (ZETIA) 10 MG tablet TAKE 1 TABLET BY MOUTH DAILY.  . fexofenadine (ALLEGRA) 180 MG tablet Take 180 mg by mouth daily.   Marland Kitchen glimepiride (AMARYL) 2 MG tablet Take 2 mg by mouth daily with breakfast.  . HYDROcodone-acetaminophen (NORCO/VICODIN)  5-325 MG tablet One tablet by mouth every six hours as needed for pain.  Marland Kitchen irbesartan-hydrochlorothiazide (AVALIDE) 150-12.5 MG tablet Take 1 tablet by mouth daily.  Marland Kitchen levothyroxine (SYNTHROID, LEVOTHROID) 88 MCG tablet Take 88 mcg by mouth daily.  . montelukast (SINGULAIR) 10 MG tablet Take 1 tablet (10 mg total) by mouth at bedtime.  . potassium chloride (K-DUR,KLOR-CON) 10 MEQ tablet TAKE 2 TABLETS BY MOUTH ONCE DAILY  . rosuvastatin (CRESTOR) 40 MG tablet Take 40 mg by mouth daily.   Marland Kitchen thyroid (ARMOUR) 60 MG tablet Take 75 mg by mouth every morning.   . [DISCONTINUED] albuterol (PROAIR HFA) 108 (90 BASE) MCG/ACT inhaler Inhale 2 puffs into the lungs every 6 (six) hours as needed. For coughing and/or wheezing  . [DISCONTINUED] canagliflozin (INVOKANA) 100 MG TABS tablet Take 100 mg by mouth daily before breakfast.  . [DISCONTINUED] CRESTOR 20 MG tablet TAKE 1 TABLET BY MOUTH DAILY. (Patient not taking: Reported on 06/23/2017)  . [DISCONTINUED] JANUVIA 100 MG tablet Take 100 mg by mouth daily.  . [DISCONTINUED] potassium chloride (K-DUR) 10 MEQ tablet Take 2 tablets (20 mEq total) by mouth daily. (Patient taking differently: Take 10 mEq by mouth 2 (two) times daily. )   No facility-administered encounter medications on file as of 06/23/2017.      Review of Systems  Constitutional:   No  weight loss, night sweats,  Fevers, chills, fatigue, or  lassitude.  HEENT:   No headaches,  Difficulty swallowing,  Tooth/dental problems, or  Sore throat,                No sneezing, itching, ear ache,  +nasal congestion, post nasal drip,   CV:  No chest pain,  Orthopnea, PND, swelling in lower extremities, anasarca, dizziness, palpitations, syncope.   GI  No heartburn, indigestion, abdominal pain, nausea, vomiting, diarrhea, change in bowel habits, loss of appetite, bloody stools.   Resp:  No excess mucus, no productive cough,  No non-productive cough,  No coughing up of blood.  No change in color of  mucus.  No wheezing.  No chest wall deformity  Skin: no rash or lesions.  GU: no dysuria, change in color of urine, no urgency or frequency.  No flank pain, no hematuria   MS:  No joint pain or swelling.  No decreased range of motion.  No back pain.    Physical Exam  BP 106/64 (BP Location: Left Arm, Cuff Size: Normal)   Pulse 76   Ht 5\' 5"  (1.651 m)   Wt 214 lb (97.1 kg)   LMP 01/06/2003 (Approximate)   SpO2 96%   BMI 35.61 kg/m   GEN: A/Ox3; pleasant , NAD, obese    HEENT:  [place]/AT,  EACs-clear, TMs-wnl, NOSE-clear drainage , THROAT-clear,  no lesions, no postnasal drip or exudate noted.   NECK:  Supple w/ fair ROM; no JVD; normal carotid impulses w/o bruits; no thyromegaly or nodules palpated; no lymphadenopathy.    RESP  Clear  P & A; w/o, wheezes/ rales/ or rhonchi. no accessory muscle use, no dullness to percussion  CARD:  RRR, no m/r/g, no peripheral edema, pulses intact, no cyanosis or clubbing.  GI:   Soft & nt; nml bowel sounds; no organomegaly or masses detected.   Musco: Warm bil, no deformities or joint swelling noted.   Neuro: alert, no focal deficits noted.    Skin: Warm, no lesions or rashes    Lab Results:   BNP No results found for: BNP  ProBNP No results found for: PROBNP  Imaging: Dg Chest 2 View  Result Date: 06/03/2017 CLINICAL DATA:  Dyspnea on exertion and cough EXAM: CHEST - 2 VIEW COMPARISON:  07/31/2015 FINDINGS: The heart is borderline enlarged. Normal vascularity. Bibasilar subsegmental atelectasis. No pneumothorax or pleural effusion. No definite consolidation. Spondylitic changes in the thoracic spine are noted. IMPRESSION: Borderline cardiomegaly without decompensation. Bibasilar atelectasis. Electronically Signed   By: Marybelle Killings M.D.   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 Halford Chessman in 2 months with PFTs and as needed       Allergic rhinitis Controlled for triggers  Plan  Patient Instructions  Continue on Symbicort 2 puffs twice daily, rinse after use Continue on Singulair daily Continue on Allegra daily as needed for drainage Ventolin inhaler 2 puffs every 6 hours as needed for wheezing  Follow-up with Dr. Halford Chessman in 2 months with PFTs and as needed        Rexene Edison, NP 06/23/2017

## 2017-06-23 NOTE — Progress Notes (Signed)
Reviewed and agree with assessment/plan.   Jackie Russman, MD [facility] 01/01/2016, 12:24 PM Pager:  [phone]  

## 2017-06-23 NOTE — Addendum Note (Signed)
Addended by: Parke Poisson E on: 06/23/2017 12:15 PM   Modules accepted: Orders

## 2017-06-23 NOTE — Assessment & Plan Note (Addendum)
COPD with asthma and emphysema Improved control with Symbicort and Singulair Control for triggers PFTs on return  Plan  Patient Instructions  Continue on Symbicort 2 puffs twice daily, rinse after use Continue on Singulair daily Continue on Allegra daily as needed for drainage Ventolin inhaler 2 puffs every 6 hours as needed for wheezing  Follow-up with Dr. Halford Chessman in 2 months with PFTs and as needed

## 2017-06-23 NOTE — Assessment & Plan Note (Addendum)
Controlled for triggers  Plan  Patient Instructions  Continue on Symbicort 2 puffs twice daily, rinse after use Continue on Singulair daily Continue on Allegra daily as needed for drainage Ventolin inhaler 2 puffs every 6 hours as needed for wheezing  Follow-up with Dr. Halford Chessman in 2 months with PFTs and as needed

## 2017-06-24 DIAGNOSIS — E039 Hypothyroidism, unspecified: Secondary | ICD-10-CM | POA: Diagnosis not present

## 2017-06-24 DIAGNOSIS — E7800 Pure hypercholesterolemia, unspecified: Secondary | ICD-10-CM | POA: Diagnosis not present

## 2017-06-24 DIAGNOSIS — E1165 Type 2 diabetes mellitus with hyperglycemia: Secondary | ICD-10-CM | POA: Diagnosis not present

## 2017-06-25 MED FILL — POTASSIUM CL 10 MEQ TAB SA: 10 | 30 days supply | Qty: 60 | Fill #5

## 2017-06-25 MED FILL — BISOPROLOL FUMARATE 5 MG TA: 5 | 90 days supply | Qty: 45 | Fill #1

## 2017-07-01 DIAGNOSIS — I10 Essential (primary) hypertension: Secondary | ICD-10-CM | POA: Diagnosis not present

## 2017-07-01 DIAGNOSIS — E039 Hypothyroidism, unspecified: Secondary | ICD-10-CM | POA: Diagnosis not present

## 2017-07-01 DIAGNOSIS — E1165 Type 2 diabetes mellitus with hyperglycemia: Secondary | ICD-10-CM | POA: Diagnosis not present

## 2017-07-01 DIAGNOSIS — E7800 Pure hypercholesterolemia, unspecified: Secondary | ICD-10-CM | POA: Diagnosis not present

## 2017-07-02 MED FILL — MONTELUKAST SOD 10 MG TAB: 10 | 30 days supply | Qty: 30 | Fill #1

## 2017-07-02 MED FILL — EZETIMIBE 10 MG TABLET: 10 | 30 days supply | Qty: 30 | Fill #9

## 2017-07-02 MED FILL — DICLOFENAC SODIUM 75 MG TAB: 75 | 30 days supply | Qty: 60 | Fill #2

## 2017-07-05 ENCOUNTER — Other Ambulatory Visit (HOSPITAL_COMMUNITY): Payer: Self-pay | Admitting: Family Medicine

## 2017-07-05 DIAGNOSIS — Z1231 Encounter for screening mammogram for malignant neoplasm of breast: Secondary | ICD-10-CM

## 2017-07-09 MED FILL — DOXEPIN 10 MG CAPSULE: 10 | 30 days supply | Qty: 30 | Fill #2

## 2017-07-09 MED FILL — hydrOXYzine HCL 25 MG TABS: 25 | 30 days supply | Qty: 30 | Fill #4

## 2017-07-12 ENCOUNTER — Other Ambulatory Visit: Payer: Self-pay | Admitting: Cardiovascular Disease

## 2017-07-12 MED FILL — CLOPIDOGREL 75 MG TABLET: 75 | 90 days supply | Qty: 90 | Fill #4

## 2017-07-12 MED FILL — GLIMEPIRIDE 2 MG TABLET: 2 | 30 days supply | Qty: 30 | Fill #1

## 2017-07-12 MED FILL — LEVOTHYROXINE 88 MCG TABLET: 88 | 90 days supply | Qty: 90 | Fill #2

## 2017-07-13 NOTE — Telephone Encounter (Signed)
This is Dr. Croitoru's pt 

## 2017-07-21 ENCOUNTER — Ambulatory Visit (HOSPITAL_COMMUNITY)
Admission: RE | Admit: 2017-07-21 | Discharge: 2017-07-21 | Disposition: A | Discharge status: Home / Self Care | Payer: 59 | Source: Ambulatory Visit | Attending: Family Medicine | Admitting: Family Medicine

## 2017-07-21 DIAGNOSIS — Z1231 Encounter for screening mammogram for malignant neoplasm of breast: Secondary | ICD-10-CM | POA: Insufficient documentation

## 2017-08-03 ENCOUNTER — Other Ambulatory Visit: Payer: Self-pay | Admitting: Cardiology

## 2017-08-03 MED FILL — dilTIAZem HCL 120 MG TABS: 120 | 30 days supply | Qty: 90 | Fill #3

## 2017-08-03 MED FILL — EZETIMIBE 10 MG TABLET: 10 | 30 days supply | Qty: 30 | Fill #10

## 2017-08-03 MED FILL — IRBESARTAN-HCTZ 150-12.5 MG: 150-12.5 | 90 days supply | Qty: 90 | Fill #0

## 2017-08-03 MED FILL — DICLOFENAC SODIUM 75 MG TAB: 75 | 30 days supply | Qty: 60 | Fill #3

## 2017-08-03 MED FILL — MONTELUKAST SOD 10 MG TAB: 10 | 30 days supply | Qty: 30 | Fill #2

## 2017-08-03 NOTE — Telephone Encounter (Signed)
Supervision Information   Supervising Provider Type of Supervision  Lorretta Harp, MD Supervision Required  Outpatient Medication Detail    Disp Refills Start End   irbesartan-hydrochlorothiazide (AVALIDE) 150-12.5 MG tablet 90 tablet 4 07/23/2016    Sig - Route: Take 1 tablet by mouth daily. - Oral   Sent to pharmacy as: irbesartan-hydrochlorothiazide (AVALIDE) 150-12.5 MG tablet   Notes to Pharmacy: We stopped valsartan hctz due to recall and changed.   E-Prescribing Status: Receipt confirmed by pharmacy (07/23/2016 3:00 PM EDT)   Pharmacy   [place], [place].

## 2017-08-12 MED FILL — hydrOXYzine HCL 25 MG TABS: 25 | 30 days supply | Qty: 30 | Fill #5

## 2017-08-16 MED FILL — ROSUVASTATIN CALCIUM 40 MG: 40 | 90 days supply | Qty: 90 | Fill #0

## 2017-08-16 MED FILL — GLIMEPIRIDE 2 MG TABLET: 2 | 30 days supply | Qty: 30 | Fill #2

## 2017-08-23 ENCOUNTER — Encounter: Payer: Self-pay | Admitting: Pulmonary Disease

## 2017-08-23 ENCOUNTER — Other Ambulatory Visit: Payer: 59

## 2017-08-23 ENCOUNTER — Ambulatory Visit: Payer: 59 | Admitting: Pulmonary Disease

## 2017-08-23 ENCOUNTER — Ambulatory Visit (INDEPENDENT_AMBULATORY_CARE_PROVIDER_SITE_OTHER): Payer: 59 | Admitting: Pulmonary Disease

## 2017-08-23 VITALS — BP 110/72 | HR 72 | Ht 64.5 in | Wt 211.0 lb

## 2017-08-23 DIAGNOSIS — J449 Chronic obstructive pulmonary disease, unspecified: Secondary | ICD-10-CM

## 2017-08-23 DIAGNOSIS — Z23 Encounter for immunization: Secondary | ICD-10-CM

## 2017-08-23 DIAGNOSIS — J432 Centrilobular emphysema: Secondary | ICD-10-CM

## 2017-08-23 LAB — PULMONARY FUNCTION TEST
DL/VA % PRED: 57 %
DL/VA: 2.78 ml/min/mmHg/L
DLCO unc % pred: 43 %
DLCO unc: 10.95 ml/min/mmHg
FEF 25-75 POST: 0.44 L/s
FEF 25-75 Pre: 0.6 L/sec
FEF2575-%Change-Post: -26 %
FEF2575-%PRED-POST: 21 %
FEF2575-%PRED-PRE: 29 %
FEV1-%CHANGE-POST: -4 %
FEV1-%Pred-Post: 59 %
FEV1-%Pred-Pre: 62 %
FEV1-PRE: 1.31 L
FEV1-Post: 1.25 L
FEV1FVC-%CHANGE-POST: 0 %
FEV1FVC-%PRED-PRE: 75 %
FEV6-%Change-Post: -7 %
FEV6-%Pred-Post: 77 %
FEV6-%Pred-Pre: 83 %
FEV6-Post: 1.98 L
FEV6-Pre: 2.15 L
FEV6FVC-%Change-Post: -3 %
FEV6FVC-%PRED-POST: 96 %
FEV6FVC-%Pred-Pre: 100 %
FVC-%Change-Post: -4 %
FVC-%PRED-POST: 79 %
FVC-%PRED-PRE: 83 %
FVC-POST: 2.11 L
FVC-PRE: 2.21 L
POST FEV1/FVC RATIO: 59 %
PRE FEV1/FVC RATIO: 59 %
PRE FEV6/FVC RATIO: 97 %
Post FEV6/FVC ratio: 94 %
RV % pred: 131 %
RV: 2.7 L
TLC % PRED: 98 %
TLC: 5.05 L

## 2017-08-23 NOTE — Progress Notes (Signed)
[facility]  Chief Complaint  Patient presents with  . Follow-up    review PFT- c/o occ sob and wheezing with exertion.    Constitutional: BP 110/72 (BP Location: Left Arm, Cuff Size: Normal)   Pulse 72   Ht 5' 4.5" (1.638 m)   Wt 211 lb (95.7 kg)   LMP 01/06/2003 (Approximate)   SpO2 92%   BMI 35.66 kg/m   History of Present Illness: Shelley [name] is a 62 y.o. 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  She had PFT earlier today.  Mild to moderate obstruction, air trapping, and moderate to severe diffusion defect.  She has been feeling better.  Not having as much cough, wheeze, or chest congestion.  Still gets winded at times, but overall doing better with activity.  Not having breathing issues while asleep.   Denies chest pain, fever, sinus congestion, sore throat, hemoptysis, skin rash, or leg swelling.   Comprehensive Respiratory Exam:  Appearance - well kempt  ENMT - nasal mucosa moist, turbinates clear, midline nasal septum, no dental lesions, no gingival bleeding, no oral exudates, no tonsillar hypertrophy Neck - no masses, trachea midline, no thyromegaly, no elevation in JVP Respiratory - normal appearance of chest wall, normal respiratory effort w/o accessory muscle use, no dullness on percussion, no wheezing or rales CV - s1s2 regular rate and rhythm, no murmurs, no peripheral edema, radial pulses symmetric GI - soft, non tender, no masses Lymph - no adenopathy noted in neck and axillary areas MSK - normal muscle strength and tone, normal gait Ext - no cyanosis, clubbing, or joint inflammation noted Skin - no rashes, lesions, or ulcers Neuro - oriented to person, place, and time Psych - normal mood and affect   Assessment/Plan:  COPD with asthma and emphysema. - had extensive discussion with her about these diagnoses - continue symbicort, singulair, allegra, and prn albuterol - she will get  pneumovax today, and will need prevnar after August of 2020 - annual flu shot >> she gets this through work - will check A1AT level   Patient Instructions  Lab tests today  Pneumonia 23 shot today  Follow up in 6 months    Chesley Mires, MD [place] 08/23/2017, 12:46 PM  Flow Sheet  Pulmonary tests: CT angio chest 08/21/15 >> centrilobular emphysema, 5 mm nodule LLL stable since 2014 FeNO 06/02/17 >> 59 Spirometry 06/02/17 >> FEV1 1.18 (55%), FEV1% 56  Cardiac tests: Echo 08/26/15 >> EF 60 to 65%, grade 1 DD  Past Medical History: She  has a past medical history of CAD (coronary artery disease), Cardiac arrhythmia, DES exposure in utero, Diabetes mellitus, Dyslipidemia, Hypertension, MI (myocardial infarction) ([place]), Shingles, and Thyroid disease.  Past Surgical History: She  has a past surgical history that includes Back surgery (5/99 & 6/99); Coronary angioplasty with stent (07/17/1998); Cardiac catheterization (01/12/2002); Coronary angioplasty with stent (01/07/2006); Coronary angioplasty with stent (03/18/2006); Cardiac catheterization (09/03/2006); Thyroid surgery; Tubal ligation; Cryotherapy; Cardiac catheterization (N/A, 07/31/2015); Cardiac catheterization (N/A, 07/31/2015); and Cardiac catheterization (N/A, 08/02/2015).  Family History: Her family history includes Esophageal cancer in her maternal aunt; Hypertension in her mother; Prostate cancer in her maternal uncle; Thyroid disease in her mother.  Social History: She  reports that she quit smoking about 11 years ago. Her smoking use included cigarettes. She has a 17.00 pack-year smoking history. She quit smokeless tobacco use about 12 years ago. She reports that she does not drink alcohol or use  drugs.  Medications: Allergies as of 08/23/2017      Reactions   Levothyroxine Itching   Patient thinks it was the dye in the tablets she was allergic to, she feels like "something is crawling under my skin"    Coreg [carvedilol]    Sob    Lopressor [metoprolol Tartrate] Cough   Fatigue   Metformin And Related Diarrhea   Norvasc [amlodipine Besylate] Palpitations   "made my heart race"      Medication List        Accurate as of 08/23/17 12:46 PM. Always use your most recent med list.          ACCU-CHEK FASTCLIX LANCETS Misc   ACCU-CHEK GUIDE test strip Generic drug:  glucose blood   albuterol 108 (90 Base) MCG/ACT inhaler Commonly known as:  PROVENTIL HFA;VENTOLIN HFA Inhale 2 puffs into the lungs every 6 (six) hours as needed for wheezing or shortness of breath.   ASPIRIN ADULT LOW STRENGTH 81 MG EC tablet Generic drug:  aspirin TAKE 1 TABLET BY MOUTH ONCE DAILY   betamethasone valerate ointment 0.1 % Commonly known as:  VALISONE Apply a pea sized amount topically BID x 1-2 weeks as needed   bisoprolol 5 MG tablet Commonly known as:  ZEBETA Take 1/2 tablet by mouth daily   budesonide-formoterol 160-4.5 MCG/ACT inhaler Commonly known as:  SYMBICORT Inhale 2 puffs into the lungs 2 (two) times daily.   clopidogrel 75 MG tablet Commonly known as:  PLAVIX Take 1 tablet (75 mg total) by mouth daily.   diclofenac 75 MG EC tablet Commonly known as:  VOLTAREN TAKE 1 TABLET (75 MG TOTAL) BY MOUTH 2 (TWO) TIMES DAILY WITH A MEAL.   diltiazem 120 MG tablet Commonly known as:  CARDIZEM TAKE 1 TABLET (120 MG TOTAL) BY MOUTH 3 (THREE) TIMES DAILY.   ezetimibe 10 MG tablet Commonly known as:  ZETIA TAKE 1 TABLET BY MOUTH DAILY.   fexofenadine 180 MG tablet Commonly known as:  ALLEGRA Take 180 mg by mouth daily.   glimepiride 2 MG tablet Commonly known as:  AMARYL Take 2 mg by mouth daily with breakfast.   HYDROcodone-acetaminophen 5-325 MG tablet Commonly known as:  NORCO/VICODIN One tablet by mouth every six hours as needed for pain.   irbesartan-hydrochlorothiazide 150-12.5 MG tablet Commonly known as:  AVALIDE TAKE 1 TABLET BY MOUTH DAILY.   levothyroxine 88 MCG  tablet Commonly known as:  SYNTHROID, LEVOTHROID Take 88 mcg by mouth daily.   montelukast 10 MG tablet Commonly known as:  SINGULAIR Take 1 tablet (10 mg total) by mouth at bedtime.   potassium chloride 10 MEQ tablet Commonly known as:  K-DUR,KLOR-CON TAKE 2 TABLETS BY MOUTH ONCE DAILY   rosuvastatin 40 MG tablet Commonly known as:  CRESTOR Take 40 mg by mouth daily.

## 2017-08-23 NOTE — Patient Instructions (Signed)
Lab tests today  Pneumonia 23 shot today  Follow up in 6 months

## 2017-08-23 NOTE — Progress Notes (Signed)
PFT done today. 

## 2017-08-25 ENCOUNTER — Ambulatory Visit: Payer: 59 | Admitting: Orthopaedic Surgery

## 2017-08-26 ENCOUNTER — Other Ambulatory Visit: Payer: Self-pay | Admitting: Cardiovascular Disease

## 2017-08-26 LAB — ALPHA-1 ANTITRYPSIN PHENOTYPE: A1 ANTITRYPSIN SER: 129 mg/dL (ref 83–199)

## 2017-08-26 MED FILL — POTASSIUM CL 10 MEQ TAB SA: 10 | 30 days supply | Qty: 60 | Fill #0

## 2017-08-31 ENCOUNTER — Telehealth: Payer: Self-pay | Admitting: Pulmonary Disease

## 2017-08-31 NOTE — Telephone Encounter (Signed)
A1AT 08/23/17 >> 129, EM   Please let her know her lab test was normal.  She doesn't have inherited form of emphysema.

## 2017-08-31 NOTE — Telephone Encounter (Signed)
Attempted to call patient today regarding results. I did not receive an answer at time of call. I have left a voicemail message for pt to return call. X1  

## 2017-09-01 ENCOUNTER — Ambulatory Visit: Payer: 59 | Admitting: Orthopaedic Surgery

## 2017-09-01 ENCOUNTER — Encounter: Payer: Self-pay | Admitting: Orthopaedic Surgery

## 2017-09-01 VITALS — BP 101/60 | HR 92 | Ht 64.5 in | Wt 211.0 lb

## 2017-09-01 DIAGNOSIS — G8929 Other chronic pain: Secondary | ICD-10-CM | POA: Diagnosis not present

## 2017-09-01 DIAGNOSIS — M25551 Pain in right hip: Secondary | ICD-10-CM

## 2017-09-01 DIAGNOSIS — M1611 Unilateral primary osteoarthritis, right hip: Secondary | ICD-10-CM

## 2017-09-01 MED ORDER — HYDROCODONE-ACETAMINOPHEN 5-325 MG PO TABS: ORAL_TABLET | ORAL | 0 refills | Status: DC

## 2017-09-01 MED FILL — HYDROCODON-APAP 5-325: 5-325 | 14 days supply | Qty: 56 | Fill #0

## 2017-09-01 NOTE — Telephone Encounter (Signed)
Attempted to call pt. I did not receive an answer. I have left a message for pt to return our call.  

## 2017-09-01 NOTE — Telephone Encounter (Signed)
Pt is calling back [phone]

## 2017-09-01 NOTE — Telephone Encounter (Signed)
Attempted to call patient today regarding results. I did not receive an answer at time of call. I have left a voicemail message for pt to return call. X2  

## 2017-09-01 NOTE — Progress Notes (Signed)
Patient Shelley [name], female DOB:Aug 25, 1955, 62 y.o. XHB:[id number]  Chief Complaint  Patient presents with  . Hip Pain    Right Hip    HPI  Shelley [name] is a 62 y.o. female who has chronic pain of the right hip.  She has pain with prolonged standing or walking. She has no new trauma.  She is active.  She is taking her medicine.   Body mass index is 35.66 kg/m.  ROS  Review of Systems  HENT: Negative for congestion.   Respiratory: Negative for cough and shortness of breath.   Cardiovascular: Negative for chest pain and leg swelling.  Endocrine: Positive for cold intolerance.  Musculoskeletal: Positive for arthralgias and gait problem.  Allergic/Immunologic: Positive for environmental allergies.  All other systems reviewed and are negative.   All other systems reviewed and are negative.  The following is a summary of the past history medically, past history surgically, known current medicines, social history and family history.  This information is gathered electronically by the computer from prior information and documentation.  I review this each visit and have found including this information at this point in the chart is beneficial and informative.    Past Medical History:  Diagnosis Date  . CAD (coronary artery disease)   . Cardiac arrhythmia   . DES exposure in utero   . Diabetes mellitus   . Dyslipidemia   . Hypertension   . MI (myocardial infarction) ([place])   . Shingles   . Thyroid disease     Past Surgical History:  Procedure Laterality Date  . BACK SURGERY  5/99 & 6/99  . CARDIAC CATHETERIZATION  01/12/2002   stent patent  . CARDIAC CATHETERIZATION  09/03/2006   patent stents  . CARDIAC CATHETERIZATION N/A 07/31/2015   Procedure: Left Heart Cath and Coronary Angiography;  Surgeon: Burnell Blanks, MD;  Location: [facility];  Service: Cardiovascular;  Laterality: N/A;  . CARDIAC CATHETERIZATION N/A 07/31/2015   Procedure:  Coronary Stent Intervention;  Surgeon: Burnell Blanks, MD;  Location: [facility];  Service: Cardiovascular;  Laterality: N/A;  . CARDIAC CATHETERIZATION N/A 08/02/2015   Procedure: Coronary Stent Intervention;  Surgeon: Leonie Man, MD;  Location: [facility];  Service: Cardiovascular;  Laterality: N/A;  . CORONARY ANGIOPLASTY WITH STENT PLACEMENT  07/17/1998   stent LAD  . CORONARY ANGIOPLASTY WITH STENT PLACEMENT  01/07/2006   stent RCA,LAD stent patent  . CORONARY ANGIOPLASTY WITH STENT PLACEMENT  03/18/2006   stent to mid RCA,prox.RCA and LAD stent patent  . CRYOTHERAPY    . THYROID SURGERY     PT. DENIES/RADIATION  . TUBAL LIGATION      Family History  Problem Relation Age of Onset  . Hypertension Mother   . Thyroid disease Mother   . Esophageal cancer Maternal Aunt   . Prostate cancer Maternal Uncle     Social History Social History   Tobacco Use  . Smoking status: Former Smoker    Packs/day: 0.50    Years: 34.00    Pack years: 17.00    Types: Cigarettes    Last attempt to quit: 2008    Years since quitting: 11.6  . Smokeless tobacco: Former Systems developer    Quit date: 01/05/2005  Substance Use Topics  . Alcohol use: No    Alcohol/week: 0.0 standard drinks  . Drug use: No    Allergies  Allergen Reactions  . Levothyroxine Itching    Patient thinks it  was the dye in the tablets she was allergic to, she feels like "something is crawling under my skin"  . Coreg [Carvedilol]     Sob   . Lopressor [Metoprolol Tartrate] Cough    Fatigue   . Metformin And Related Diarrhea  . Norvasc [Amlodipine Besylate] Palpitations    "made my heart race"    Current Outpatient Medications  Medication Sig Dispense Refill  . ACCU-CHEK FASTCLIX LANCETS MISC   3  . ACCU-CHEK GUIDE test strip   3  . albuterol (VENTOLIN HFA) 108 (90 Base) MCG/ACT inhaler Inhale 2 puffs into the lungs every 6 (six) hours as needed for wheezing or shortness of breath. 1 Inhaler 5  .  ASPIRIN ADULT LOW STRENGTH 81 MG EC tablet TAKE 1 TABLET BY MOUTH ONCE DAILY 30 tablet 3  . betamethasone valerate ointment (VALISONE) 0.1 % Apply a pea sized amount topically BID x 1-2 weeks as needed 15 g 0  . bisoprolol (ZEBETA) 5 MG tablet Take 1/2 tablet by mouth daily 45 tablet 1  . budesonide-formoterol (SYMBICORT) 160-4.5 MCG/ACT inhaler Inhale 2 puffs into the lungs 2 (two) times daily. 1 Inhaler 6  . clopidogrel (PLAVIX) 75 MG tablet Take 1 tablet (75 mg total) by mouth daily. 30 tablet 11  . diclofenac (VOLTAREN) 75 MG EC tablet TAKE 1 TABLET (75 MG TOTAL) BY MOUTH 2 (TWO) TIMES DAILY WITH A MEAL. 60 tablet 5  . diltiazem (CARDIZEM) 120 MG tablet TAKE 1 TABLET (120 MG TOTAL) BY MOUTH 3 (THREE) TIMES DAILY. 90 tablet 11  . ezetimibe (ZETIA) 10 MG tablet TAKE 1 TABLET BY MOUTH DAILY. 30 tablet 10  . fexofenadine (ALLEGRA) 180 MG tablet Take 180 mg by mouth daily.     Marland Kitchen glimepiride (AMARYL) 2 MG tablet Take 2 mg by mouth daily with breakfast.    . HYDROcodone-acetaminophen (NORCO/VICODIN) 5-325 MG tablet One tablet by mouth every six hours as needed for pain. 56 tablet 0  . irbesartan-hydrochlorothiazide (AVALIDE) 150-12.5 MG tablet TAKE 1 TABLET BY MOUTH DAILY. 90 tablet 0  . levothyroxine (SYNTHROID, LEVOTHROID) 88 MCG tablet Take 88 mcg by mouth daily.  4  . montelukast (SINGULAIR) 10 MG tablet Take 1 tablet (10 mg total) by mouth at bedtime. 30 tablet 5  . potassium chloride (K-DUR,KLOR-CON) 10 MEQ tablet TAKE 2 TABLETS BY MOUTH ONCE DAILY 60 tablet 0  . rosuvastatin (CRESTOR) 40 MG tablet Take 40 mg by mouth daily.   3   No current facility-administered medications for this visit.      Physical Exam  Blood pressure 101/60, pulse 92, height 5' 4.5" (1.638 m), weight 211 lb (95.7 kg), last menstrual period 01/06/2003.  Constitutional: overall normal hygiene, normal nutrition, well developed, normal grooming, normal body habitus. Assistive device:none  Musculoskeletal: gait and  station Limp none, muscle tone and strength are normal, no tremors or atrophy is present.  .  Neurological: coordination overall normal.  Deep tendon reflex/nerve stretch intact.  Sensation normal.  Cranial nerves II-XII intact.   Skin:   Normal overall no scars, lesions, ulcers or rashes. No psoriasis.  Psychiatric: Alert and oriented x 3.  Recent memory intact, remote memory unclear.  Normal mood and affect. Well groomed.  Good eye contact.  Cardiovascular: overall no swelling, no varicosities, no edema bilaterally, normal temperatures of the legs and arms, no clubbing, cyanosis and good capillary refill.  Lymphatic: palpation is normal.  Right hip is tender.  NV intact. ROM is good. She has no limp today.  All other systems reviewed and are negative   The patient has been educated about the nature of the problem(s) and counseled on treatment options.  The patient appeared to understand what I have discussed and is in agreement with it.  Encounter Diagnoses  Name Primary?  . Chronic pain of right hip Yes  . Primary osteoarthritis of right hip     PLAN Call if any problems.  Precautions discussed.  Continue current medications.   Return to clinic 3 months   .[email or web address]  Electronically Signed Sanjuana Kava, MD 8/28/20199:46 AM

## 2017-09-01 NOTE — Telephone Encounter (Signed)
Called patient, made aware of results per MD [facility]. Voiced understanding. Nothing further needed at this time.

## 2017-09-01 NOTE — Telephone Encounter (Signed)
Pt is returning call. Cb is [phone].

## 2017-09-02 ENCOUNTER — Other Ambulatory Visit: Payer: Self-pay | Admitting: Cardiovascular Disease

## 2017-09-02 ENCOUNTER — Telehealth: Payer: Self-pay | Admitting: Cardiovascular Disease

## 2017-09-02 MED FILL — EZETIMIBE 10 MG TABLET: 10 | 30 days supply | Qty: 30 | Fill #0

## 2017-09-02 MED FILL — MONTELUKAST SOD 10 MG TAB: 10 | 30 days supply | Qty: 30 | Fill #3

## 2017-09-02 MED FILL — DICLOFENAC SODIUM 75 MG TAB: 75 | 30 days supply | Qty: 60 | Fill #4

## 2017-09-02 NOTE — Telephone Encounter (Signed)
Call patient left voicemail asking her to RTC. Need to verify if she is taking Zetia 10 mg PO daily. Per the 09/12/2016 AVS states patient is not taking but it doesn't show where Dr. Loletha Grayer stopped the medication.

## 2017-09-02 NOTE — Telephone Encounter (Signed)
New Message:      Pt is returning a call regarding her Zetia prescription

## 2017-09-02 NOTE — Telephone Encounter (Signed)
Informed pt, refill was sent over earlier today to [facility]. Pt verbalized understanding.

## 2017-09-17 MED FILL — VENTOLIN HFA 90 MCG INHALER: 108 (90 BAS | 25 days supply | Qty: 18 | Fill #1

## 2017-09-17 MED FILL — DOXEPIN 10 MG CAPSULE: 10 | 30 days supply | Qty: 30 | Fill #3

## 2017-09-17 MED FILL — SYMBICORT 160-4.5 MCG INH: 160-4.5 | 30 days supply | Qty: 10 | Fill #0

## 2017-09-21 MED FILL — hydrOXYzine HCL 25 MG TABS: 25 | 30 days supply | Qty: 30 | Fill #0

## 2017-09-22 ENCOUNTER — Other Ambulatory Visit: Payer: Self-pay | Admitting: Cardiovascular Disease

## 2017-09-22 MED FILL — GLIMEPIRIDE 2 MG TABLET: 2 | 30 days supply | Qty: 30 | Fill #3

## 2017-09-22 MED FILL — BISOPROLOL FUMARATE 5 MG TA: 5 | 90 days supply | Qty: 45 | Fill #0

## 2017-10-06 MED FILL — EZETIMIBE 10 MG TABLET: 10 | 30 days supply | Qty: 30 | Fill #1

## 2017-10-06 MED FILL — MONTELUKAST SOD 10 MG TAB: 10 | 30 days supply | Qty: 30 | Fill #4

## 2017-10-11 MED FILL — CLOPIDOGREL 75 MG TABLET: 75 | 30 days supply | Qty: 30 | Fill #5

## 2017-10-11 MED FILL — LEVOTHYROXINE 88 MCG TABLET: 88 | 90 days supply | Qty: 90 | Fill #3

## 2017-10-11 MED FILL — DICLOFENAC SODIUM 75 MG TAB: 75 | 30 days supply | Qty: 60 | Fill #5

## 2017-10-26 ENCOUNTER — Other Ambulatory Visit: Payer: Self-pay | Admitting: Cardiovascular Disease

## 2017-10-26 MED FILL — GLIMEPIRIDE 2 MG TABLET: 2 | 90 days supply | Qty: 90 | Fill #0

## 2017-10-26 MED FILL — POTASSIUM CHLORIDE CRYS ER: 10 | 30 days supply | Qty: 60 | Fill #0

## 2017-11-01 ENCOUNTER — Encounter: Payer: Self-pay | Admitting: Cardiology

## 2017-11-01 ENCOUNTER — Ambulatory Visit: Payer: 59 | Admitting: Cardiology

## 2017-11-01 VITALS — BP 135/89 | HR 81 | Ht 65.0 in | Wt 209.6 lb

## 2017-11-01 DIAGNOSIS — E785 Hyperlipidemia, unspecified: Secondary | ICD-10-CM | POA: Diagnosis not present

## 2017-11-01 DIAGNOSIS — Z9861 Coronary angioplasty status: Secondary | ICD-10-CM

## 2017-11-01 DIAGNOSIS — I10 Essential (primary) hypertension: Secondary | ICD-10-CM

## 2017-11-01 DIAGNOSIS — I2510 Atherosclerotic heart disease of native coronary artery without angina pectoris: Secondary | ICD-10-CM

## 2017-11-01 DIAGNOSIS — J449 Chronic obstructive pulmonary disease, unspecified: Secondary | ICD-10-CM | POA: Diagnosis not present

## 2017-11-01 MED ORDER — IRBESARTAN-HYDROCHLOROTHIAZIDE 150-12.5 MG PO TABS: 1.0000 | ORAL_TABLET | Freq: Every day | ORAL | 3 refills | Status: DC

## 2017-11-01 MED ORDER — DILTIAZEM HCL 120 MG PO TABS: 120.0000 mg | ORAL_TABLET | Freq: Two times a day (BID) | ORAL | 3 refills | Status: DC

## 2017-11-01 MED ORDER — POTASSIUM CHLORIDE CRYS ER 10 MEQ PO TBCR: 20.0000 meq | EXTENDED_RELEASE_TABLET | Freq: Every day | ORAL | 3 refills | Status: DC

## 2017-11-01 MED ORDER — CLOPIDOGREL BISULFATE 75 MG PO TABS: 75.0000 mg | ORAL_TABLET | Freq: Every day | ORAL | 3 refills | Status: DC

## 2017-11-01 MED ORDER — BISOPROLOL FUMARATE 5 MG PO TABS: 2.5000 mg | ORAL_TABLET | Freq: Every day | ORAL | 3 refills | Status: DC

## 2017-11-01 MED FILL — IRBESARTAN-HCTZ 150-12.5 MG: 150-12.5 | 90 days supply | Qty: 90 | Fill #0

## 2017-11-01 MED FILL — dilTIAZem HCL 120 MG TABS: 120 | 90 days supply | Qty: 180 | Fill #0

## 2017-11-01 NOTE — Progress Notes (Addendum)
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 She has a long history of coronary disease. In 2001, when she was only 62 years old, she received a bare-metal stent to the mid LAD artery. In 2008 she presented with an acute myocardial infarction and received a Taxus stent to the LAD artery. She subsequently underwent staged revascularization with a Cypher DES to the distal LAD artery and Cypher stent to the RCA.  In July 31 2015 she received a DES to the distal RCA for a STEMI. She has normal left ventricular systolic function. Other medical issues include HTN, HLD, NIDDM, obesity, and asthmatic COPD. She is in the office today for a yearly check and to have preop clearance for upcoming hip surgery.  She is doing well from a cardiac standpoint.  She walks 30 minutes 3-4 times a week on her treadmill at home.  She bowls competitively a couple times a week as well.  She had no unusual chest pain shortness of breath or nitroglycerin use.   Current Outpatient Medications  Medication Sig Dispense Refill  . ACCU-CHEK FASTCLIX LANCETS MISC   3  . ACCU-CHEK GUIDE test strip   3  . albuterol (VENTOLIN HFA) 108 (90 Base) MCG/ACT inhaler Inhale 2 puffs into the lungs every 6 (six) hours as needed for wheezing or shortness of breath. 1 Inhaler 5  . ASPIRIN ADULT LOW STRENGTH 81 MG EC tablet TAKE 1 TABLET BY MOUTH ONCE DAILY 30 tablet 3  . betamethasone valerate ointment (VALISONE) 0.1 % Apply a pea sized amount topically BID x 1-2 weeks as needed 15 g 0  . bisoprolol (ZEBETA) 5 MG tablet Take 0.5 tablets (2.5 mg total) by mouth daily. NEED OV. 45 tablet 0  . budesonide-formoterol (SYMBICORT) 160-4.5 MCG/ACT inhaler Inhale 2 puffs into the lungs 2 (two) times daily. 1 Inhaler 6  . clopidogrel (PLAVIX) 75 MG tablet Take 1 tablet (75 mg total) by mouth daily. 30 tablet 11  .  diclofenac (VOLTAREN) 75 MG EC tablet TAKE 1 TABLET (75 MG TOTAL) BY MOUTH 2 (TWO) TIMES DAILY WITH A MEAL. 60 tablet 5  . diltiazem (CARDIZEM) 120 MG tablet TAKE 1 TABLET (120 MG TOTAL) BY MOUTH 3 (THREE) TIMES DAILY. 90 tablet 11  . ezetimibe (ZETIA) 10 MG tablet TAKE 1 TABLET BY MOUTH DAILY. 30 tablet 10  . fexofenadine (ALLEGRA) 180 MG tablet Take 180 mg by mouth daily.     Marland Kitchen glimepiride (AMARYL) 2 MG tablet Take 2 mg by mouth daily with breakfast.    . HYDROcodone-acetaminophen (NORCO/VICODIN) 5-325 MG tablet One tablet by mouth every six hours as needed for pain. 56 tablet 0  . irbesartan-hydrochlorothiazide (AVALIDE) 150-12.5 MG tablet TAKE 1 TABLET BY MOUTH DAILY. 90 tablet 0  . levothyroxine (SYNTHROID, LEVOTHROID) 88 MCG tablet Take 88 mcg by mouth daily.  4  . montelukast (SINGULAIR) 10 MG tablet Take 1 tablet (10 mg total) by mouth at bedtime. 30 tablet 5  . potassium chloride (K-DUR,KLOR-CON) 10 MEQ tablet Take 2 tablets (20 mEq total) by mouth daily. NEED OV. 60 tablet 1  . rosuvastatin (CRESTOR) 40 MG tablet Take 40 mg by mouth daily.   3   No current facility-administered medications for this visit.     Allergies  Allergen Reactions  . Levothyroxine Itching    Patient thinks it was the dye in  the tablets she was allergic to, she feels like "something is crawling under my skin"  . Coreg [Carvedilol]     Sob   . Lopressor [Metoprolol Tartrate] Cough    Fatigue   . Metformin And Related Diarrhea  . Norvasc [Amlodipine Besylate] Palpitations    "made my heart race"    Past Medical History:  Diagnosis Date  . CAD (coronary artery disease)   . Cardiac arrhythmia   . DES exposure in utero   . Diabetes mellitus   . Dyslipidemia   . Hypertension   . MI (myocardial infarction) ([place])   . Shingles   . Thyroid disease     Social History   Socioeconomic History  . Marital status: Married    Spouse name: Not on file  . Number of children: Not on file  . Years of  education: Not on file  . Highest education level: Not on file  Occupational History  . Occupation: EKG Engineer, production: [employer]  Social Needs  . Financial resource strain: Not on file  . Food insecurity:    Worry: Not on file    Inability: Not on file  . Transportation needs:    Medical: Not on file    Non-medical: Not on file  Tobacco Use  . Smoking status: Former Smoker    Packs/day: 0.50    Years: 34.00    Pack years: 17.00    Types: Cigarettes    Last attempt to quit: 2008    Years since quitting: 11.8  . Smokeless tobacco: Former Systems developer    Quit date: 01/05/2005  Substance and Sexual Activity  . Alcohol use: No    Alcohol/week: 0.0 standard drinks  . Drug use: No  . Sexual activity: Yes    Partners: Male    Birth control/protection: Post-menopausal  Lifestyle  . Physical activity:    Days per week: Not on file    Minutes per session: Not on file  . Stress: Not on file  Relationships  . Social connections:    Talks on phone: Not on file    Gets together: Not on file    Attends religious service: Not on file    Active member of club or organization: Not on file    Attends meetings of clubs or organizations: Not on file    Relationship status: Not on file  . Intimate partner violence:    Fear of current or ex partner: Not on file    Emotionally abused: Not on file    Physically abused: Not on file    Forced sexual activity: Not on file  Other Topics Concern  . Not on file  Social History Narrative  . Not on file     Family History  Problem Relation Age of Onset  . Hypertension Mother   . Thyroid disease Mother   . Esophageal cancer Maternal Aunt   . Prostate cancer Maternal Uncle      Review of Systems: General: negative for chills, fever, night sweats or weight changes.  Cardiovascular: negative for chest pain, dyspnea on exertion, edema, orthopnea, palpitations, paroxysmal nocturnal dyspnea or shortness of breath Dermatological: negative for  rash Respiratory: negative for cough or wheezing Urologic: negative for hematuria Abdominal: negative for nausea, vomiting, diarrhea, bright red blood per rectum, melena, or hematemesis Neurologic: negative for visual changes, syncope, or dizziness She is never had a sleep study.  She denies poor sleep patterns or daytime fatigue. All other systems reviewed and  are otherwise negative except as noted above.    Blood pressure 135/89, pulse 81, height 5\' 5"  (1.651 m), weight 209 lb 9.6 oz (95.1 kg), last menstrual period 01/06/2003.  General appearance: alert, cooperative, no distress and moderately obese Neck: no carotid bruit and no JVD Lungs: clear to auscultation bilaterally Heart: regular rate and rhythm Extremities: no edema Skin: Skin color, texture, turgor normal. No rashes or lesions Neurologic: Grossly normal  EKG normal sinus rhythm rate 81 T wave inversion V3 through V6 as well as 1 and aVL  ASSESSMENT AND PLAN:   Coronary disease Patient has extensive coronary disease history as noted above with multiple PCI's.  She is currently stable.  Preop cardiovascular examination Patient is an acceptable risk from a cardiac standpoint for proposed surgery.  I will discuss stopping Plavix with Dr. Sallyanne Kuster but this should not be an issue.  Non-insulin-dependent diabetes On oral agents followed by her PCP  Dyslipidemia On high-dose statin therapy followed by Dr. Michiel Sites.  Essential hypertension Fair control on her current medications.  In the past she has been intolerant to sustained release diltiazem.  She does tolerate immediate release diltiazem twice daily which he takes now.  PLAN same cardiac treatment for now.  She is an acceptable risk from a cardiac standpoint for proposed hip replacement.  I will review with Dr.Croitoru but it should be okay to stop her Plavix preop.  Kerin Ransom PA-C 11/01/2017 9:51 AM   Discussed with Dr Sallyanne Kuster- OK to stop Plavix 5-7 days pre op  if needed for hip surgery.  Kerin Ransom PA-C 11/04/2017 4:56 PM

## 2017-11-01 NOTE — Patient Instructions (Signed)
Medication Instructions:  Your physician recommends that you continue on your current medications as directed. Please refer to the Current Medication list given to you today. If you need a refill on your cardiac medications before your next appointment, please call your pharmacy.   Lab work: NONE  Testing/Procedures: NONE  Follow-Up: At [facility], you and your health needs are our priority.  As part of our continuing mission to provide you with exceptional heart care, we have created designated Provider Care Teams.  These Care Teams include your primary Cardiologist (physician) and Advanced Practice Providers (APPs -  Physician Assistants and Nurse Practitioners) who all work together to provide you with the care you need, when you need it. You will need a follow up appointment in 12 months.  Please call our office 2 months in advance to schedule this appointment.  You may see DR Sallyanne Kuster or one of the following Advanced Practice Providers on your designated Care Team:   Kerin Ransom, PA-C Roby Lofts, [place] . Sande Rives, PA-C

## 2017-11-02 NOTE — Progress Notes (Signed)
Yes, OK to stop plavix for 5-7 days before hip replacement. MCr

## 2017-11-03 DIAGNOSIS — M1611 Unilateral primary osteoarthritis, right hip: Secondary | ICD-10-CM | POA: Diagnosis not present

## 2017-11-03 DIAGNOSIS — M25551 Pain in right hip: Secondary | ICD-10-CM | POA: Diagnosis not present

## 2017-11-03 MED FILL — ACCU-CHEK GUIDE STRP: 90 days supply | Qty: 200 | Fill #1

## 2017-11-05 MED FILL — EZETIMIBE 10 MG TABLET: 10 | 30 days supply | Qty: 30 | Fill #2

## 2017-11-05 MED FILL — hydrOXYzine HCL 25 MG TABS: 25 | 30 days supply | Qty: 30 | Fill #1

## 2017-11-08 ENCOUNTER — Telehealth: Payer: Self-pay

## 2017-11-08 NOTE — Telephone Encounter (Signed)
Spoke with patient she voiced understanding and requested we send Dr Alvan Dame a copy of her cardiac clearance note. Informed patient that I would fax his office a copy of the clearance.

## 2017-11-08 NOTE — Telephone Encounter (Signed)
-----   Message from Erlene Quan, [place] sent at 11/04/2017  4:54 PM EDT ----- T please let Ms Saintil know its OK with Dr Gwenlyn Found to stop her Plavix 5-7 days before her hip surgery, resume post op.   Kerin Ransom PA-C 11/04/2017 4:54 PM

## 2017-11-09 ENCOUNTER — Other Ambulatory Visit: Payer: Self-pay | Admitting: Orthopedic Surgery

## 2017-11-09 MED FILL — MONTELUKAST SOD 10 MG TAB: 10 | 30 days supply | Qty: 30 | Fill #5

## 2017-11-09 MED FILL — CLOPIDOGREL 75 MG TABLET: 75 | 90 days supply | Qty: 90 | Fill #0

## 2017-11-10 MED FILL — DICLOFENAC SODIUM 75 MG TAB: 75 | 30 days supply | Qty: 60 | Fill #0

## 2017-11-13 DIAGNOSIS — E785 Hyperlipidemia, unspecified: Secondary | ICD-10-CM | POA: Diagnosis not present

## 2017-11-13 DIAGNOSIS — I10 Essential (primary) hypertension: Secondary | ICD-10-CM | POA: Diagnosis not present

## 2017-11-15 DIAGNOSIS — I2510 Atherosclerotic heart disease of native coronary artery without angina pectoris: Secondary | ICD-10-CM | POA: Diagnosis not present

## 2017-11-15 DIAGNOSIS — Z7984 Long term (current) use of oral hypoglycemic drugs: Secondary | ICD-10-CM | POA: Diagnosis not present

## 2017-11-15 DIAGNOSIS — G4700 Insomnia, unspecified: Secondary | ICD-10-CM | POA: Diagnosis not present

## 2017-11-15 DIAGNOSIS — M25551 Pain in right hip: Secondary | ICD-10-CM | POA: Diagnosis not present

## 2017-11-15 DIAGNOSIS — Z7901 Long term (current) use of anticoagulants: Secondary | ICD-10-CM | POA: Diagnosis not present

## 2017-11-15 DIAGNOSIS — E785 Hyperlipidemia, unspecified: Secondary | ICD-10-CM | POA: Diagnosis not present

## 2017-11-15 DIAGNOSIS — I10 Essential (primary) hypertension: Secondary | ICD-10-CM | POA: Diagnosis not present

## 2017-11-15 DIAGNOSIS — E118 Type 2 diabetes mellitus with unspecified complications: Secondary | ICD-10-CM | POA: Diagnosis not present

## 2017-11-15 DIAGNOSIS — E039 Hypothyroidism, unspecified: Secondary | ICD-10-CM | POA: Diagnosis not present

## 2017-11-15 MED FILL — DOXEPIN 10 MG CAPSULE: 10 | 30 days supply | Qty: 30 | Fill #0

## 2017-11-26 MED FILL — ROSUVASTATIN CALCIUM 40 MG: 40 | 90 days supply | Qty: 90 | Fill #1

## 2017-11-27 DIAGNOSIS — Z7901 Long term (current) use of anticoagulants: Secondary | ICD-10-CM | POA: Diagnosis not present

## 2017-11-27 DIAGNOSIS — E118 Type 2 diabetes mellitus with unspecified complications: Secondary | ICD-10-CM | POA: Diagnosis not present

## 2017-11-29 NOTE — H&P (Signed)
TOTAL HIP ADMISSION H&P  Patient is admitted for right total hip arthroplasty, anterior approach.  Subjective:  Chief Complaint:   Right hip primary OA / pain  HPI: Shelley [name], 62 y.o. female, has a history of pain and functional disability in the right hip(s) due to arthritis and patient has failed non-surgical conservative treatments for greater than 12 weeks to include NSAID's and/or analgesics, corticosteriod injections and activity modification.  Onset of symptoms was gradual starting ~1 years ago with gradually worsening course since that time.The patient noted no past surgery on the right hip(s).  Patient currently rates pain in the right hip at 9 out of 10 with activity. Patient has night pain, worsening of pain with activity and weight bearing, trendelenberg gait, pain that interfers with activities of daily living and pain with passive range of motion. Patient has evidence of periarticular osteophytes and joint space narrowing by imaging studies. This condition presents safety issues increasing the risk of falls.  There is no current active infection.  Risks, benefits and expectations were discussed with the patient.  Risks including but not limited to the risk of anesthesia, blood clots, nerve damage, blood vessel damage, failure of the prosthesis, infection and up to and including death.  Patient understand the risks, benefits and expectations and wishes to proceed with surgery.   PCP: [name], MD  D/C Plans:       Home  Post-op Meds:       No Rx given  Tranexamic Acid:      To be given - IV   Decadron:      Is to be given  FYI:     Plavix & ASA  Norco  DME:   Rx given for - RW & 3-n-1  PT:   No PT    Patient Active Problem List   Diagnosis Date Noted  . COPD mixed type ([place]) 06/23/2017  . Allergic rhinitis 06/23/2017  . Hypokalemia 08/03/2015  . Status post coronary artery stent placement   . Coronary artery disease involving native coronary artery   . ST  elevation myocardial infarction (STEMI) of inferior wall ([place]) 07/31/2015  . ST elevation myocardial infarction involving right coronary artery ([place])   . DES exposure in utero   . CAD S/P percutaneous coronary angioplasty 08/30/2012  . HTN (hypertension) 08/30/2012  . Obesity (BMI 30.0-34.9) 08/30/2012  . Dyslipidemia, goal LDL below 70 08/30/2012  . Non-insulin treated type 2 diabetes mellitus ([place]) 03/14/2012   Past Medical History:  Diagnosis Date  . CAD (coronary artery disease)   . Cardiac arrhythmia   . DES exposure in utero   . Diabetes mellitus   . Dyslipidemia   . Hypertension   . MI (myocardial infarction) ([place])   . Shingles   . Thyroid disease     Past Surgical History:  Procedure Laterality Date  . BACK SURGERY  5/99 & 6/99  . CARDIAC CATHETERIZATION  01/12/2002   stent patent  . CARDIAC CATHETERIZATION  09/03/2006   patent stents  . CARDIAC CATHETERIZATION N/A 07/31/2015   Procedure: Left Heart Cath and Coronary Angiography;  Surgeon: Burnell Blanks, MD;  Location: [facility];  Service: Cardiovascular;  Laterality: N/A;  . CARDIAC CATHETERIZATION N/A 07/31/2015   Procedure: Coronary Stent Intervention;  Surgeon: Burnell Blanks, MD;  Location: [facility];  Service: Cardiovascular;  Laterality: N/A;  . CARDIAC CATHETERIZATION N/A 08/02/2015   Procedure: Coronary Stent Intervention;  Surgeon: Leonie Man, MD;  Location: [facility];  Service: Cardiovascular;  Laterality: N/A;  . CORONARY ANGIOPLASTY WITH STENT PLACEMENT  07/17/1998   stent LAD  . CORONARY ANGIOPLASTY WITH STENT PLACEMENT  01/07/2006   stent RCA,LAD stent patent  . CORONARY ANGIOPLASTY WITH STENT PLACEMENT  03/18/2006   stent to mid RCA,prox.RCA and LAD stent patent  . CRYOTHERAPY    . THYROID SURGERY     PT. DENIES/RADIATION  . TUBAL LIGATION      No current facility-administered medications for this encounter.    Current Outpatient Medications  Medication Sig  Dispense Refill Last Dose  . ACCU-CHEK FASTCLIX LANCETS MISC   3 Taking  . ACCU-CHEK GUIDE test strip   3 Taking  . albuterol (VENTOLIN HFA) 108 (90 Base) MCG/ACT inhaler Inhale 2 puffs into the lungs every 6 (six) hours as needed for wheezing or shortness of breath. 1 Inhaler 5 Taking  . ASPIRIN ADULT LOW STRENGTH 81 MG EC tablet TAKE 1 TABLET BY MOUTH ONCE DAILY 30 tablet 3 Taking  . betamethasone valerate ointment (VALISONE) 0.1 % Apply a pea sized amount topically BID x 1-2 weeks as needed 15 g 0 Taking  . bisoprolol (ZEBETA) 5 MG tablet Take 0.5 tablets (2.5 mg total) by mouth daily. 45 tablet 3   . budesonide-formoterol (SYMBICORT) 160-4.5 MCG/ACT inhaler Inhale 2 puffs into the lungs 2 (two) times daily. 1 Inhaler 6 Taking  . clopidogrel (PLAVIX) 75 MG tablet Take 1 tablet (75 mg total) by mouth daily. 90 tablet 3   . diclofenac (VOLTAREN) 75 MG EC tablet TAKE 1 TABLET (75 MG TOTAL) BY MOUTH TWICE A DAY WITH A MEAL. 60 tablet 5   . diltiazem (CARDIZEM) 120 MG tablet Take 1 tablet (120 mg total) by mouth 2 (two) times daily. 180 tablet 3   . ezetimibe (ZETIA) 10 MG tablet TAKE 1 TABLET BY MOUTH DAILY. 30 tablet 10 Taking  . fexofenadine (ALLEGRA) 180 MG tablet Take 180 mg by mouth daily.    Taking  . glimepiride (AMARYL) 2 MG tablet Take 2 mg by mouth daily with breakfast.   Taking  . HYDROcodone-acetaminophen (NORCO/VICODIN) 5-325 MG tablet One tablet by mouth every six hours as needed for pain. 56 tablet 0 Taking  . irbesartan-hydrochlorothiazide (AVALIDE) 150-12.5 MG tablet Take 1 tablet by mouth daily. 90 tablet 3   . levothyroxine (SYNTHROID, LEVOTHROID) 88 MCG tablet Take 88 mcg by mouth daily.  4 Taking  . montelukast (SINGULAIR) 10 MG tablet Take 1 tablet (10 mg total) by mouth at bedtime. 30 tablet 5 Taking  . potassium chloride (K-DUR,KLOR-CON) 10 MEQ tablet Take 2 tablets (20 mEq total) by mouth daily. 180 tablet 3   . rosuvastatin (CRESTOR) 40 MG tablet Take 40 mg by mouth daily.    3 Taking   Allergies  Allergen Reactions  . Levothyroxine Itching    Patient thinks it was the dye in the tablets she was allergic to, she feels like "something is crawling under my skin"  . Coreg [Carvedilol]     Sob   . Lopressor [Metoprolol Tartrate] Cough    Fatigue   . Metformin And Related Diarrhea  . Norvasc [Amlodipine Besylate] Palpitations    "made my heart race"    Social History   Tobacco Use  . Smoking status: Former Smoker    Packs/day: 0.50    Years: 34.00    Pack years: 17.00    Types: Cigarettes    Last attempt to quit: 2008  Years since quitting: 11.9  . Smokeless tobacco: Former Systems developer    Quit date: 01/05/2005  Substance Use Topics  . Alcohol use: No    Alcohol/week: 0.0 standard drinks    Family History  Problem Relation Age of Onset  . Hypertension Mother   . Thyroid disease Mother   . Esophageal cancer Maternal Aunt   . Prostate cancer Maternal Uncle      Review of Systems  Constitutional: Negative.   HENT: Negative.   Eyes: Negative.   Respiratory: Negative.   Cardiovascular: Negative.   Gastrointestinal: Negative.   Genitourinary: Negative.   Musculoskeletal: Positive for joint pain.  Skin: Negative.   Neurological: Negative.   Endo/Heme/Allergies: Negative.   Psychiatric/Behavioral: Negative.     Objective:  Physical Exam  Constitutional: She is oriented to person, place, and time. She appears well-developed.  HENT:  Head: Normocephalic.  Mouth/Throat: She has dentures.  Eyes: Pupils are equal, round, and reactive to light.  Neck: Neck supple. No JVD present. No tracheal deviation present. No thyromegaly present.  Cardiovascular: Normal rate, regular rhythm and intact distal pulses.  Respiratory: Effort normal and breath sounds normal. No respiratory distress. She has no wheezes.  GI: Soft. There is no tenderness. There is no guarding.  Musculoskeletal:       Right hip: She exhibits decreased range of motion, decreased  strength, tenderness and bony tenderness. She exhibits no swelling, no deformity and no laceration.  Lymphadenopathy:    She has no cervical adenopathy.  Neurological: She is alert and oriented to person, place, and time.  Skin: Skin is warm and dry.  Psychiatric: She has a normal mood and affect.      Labs:  Estimated body mass index is 34.88 kg/m as calculated from the following:   Height as of 11/01/17: 5\' 5"  (1.651 m).   Weight as of 11/01/17: 95.1 kg.   Imaging Review Plain radiographs demonstrate severe degenerative joint disease of the right hip. The bone quality appears to be good for age and reported activity level.    Preoperative templating of the joint replacement has been completed, documented, and submitted to the Operating Room personnel in order to optimize intra-operative equipment management.     Assessment/Plan:  End stage arthritis, right hip  The patient history, physical examination, clinical judgement of the provider and imaging studies are consistent with end stage degenerative joint disease of the right hip and total hip arthroplasty is deemed medically necessary. The treatment options including medical management, injection therapy, arthroscopy and arthroplasty were discussed at length. The risks and benefits of total hip arthroplasty were presented and reviewed. The risks due to aseptic loosening, infection, stiffness, dislocation/subluxation,  thromboembolic complications and other imponderables were discussed.  The patient acknowledged the explanation, agreed to proceed with the plan and consent was signed. Patient is being admitted for inpatient treatment for surgery, pain control, PT, OT, prophylactic antibiotics, VTE prophylaxis, progressive ambulation and ADL's and discharge planning.The patient is planning to be discharged home.     [place] Simrat Kendrick   PA-C  11/29/2017, 8:50 AM

## 2017-12-01 ENCOUNTER — Ambulatory Visit: Payer: 59 | Admitting: Orthopaedic Surgery

## 2017-12-08 DIAGNOSIS — M25551 Pain in right hip: Secondary | ICD-10-CM | POA: Diagnosis not present

## 2017-12-09 ENCOUNTER — Encounter (HOSPITAL_COMMUNITY): Payer: Self-pay

## 2017-12-09 NOTE — Patient Instructions (Addendum)
Shelley [name]  12/09/2017   Your procedure is scheduled on: 12-21-17   Report to [facility] Main  Entrance              Report to admitting at      0530    AM    Call this number if you have problems the morning of surgery [phone]    Remember: Do not eat food or drink liquids :After Midnight.  BRUSH YOUR TEETH MORNING OF SURGERY AND RINSE YOUR MOUTH OUT, NO CHEWING GUM CANDY OR MINTS.     Take these medicines the morning of surgery with A SIP OF WATER: crestor, levothyroxine, allegra, zetia, dilitiazem, inhalers and bring them with you,             Bisoprolol  DO NOT TAKE ANY DIABETIC MEDICATIONS DAY OF YOUR SURGERY                               You may not have any metal on your body including hair pins and              piercings  Do not wear jewelry, make-up, lotions, powders or perfumes, deodorant             Do not wear nail polish.  Do not shave  48 hours prior to surgery.     Do not bring valuables to the hospital. [place].  Contacts, dentures or bridgework may not be worn into surgery.  Leave suitcase in the car. After surgery it may be brought to your room.               Please read over the following fact sheets you were given: _____________________________________________________________________            [facility] - Preparing for Surgery Before surgery, you can play an important role.  Because skin is not sterile, your skin needs to be as free of germs as possible.  You can reduce the number of germs on your skin by washing with CHG (chlorahexidine gluconate) soap before surgery.  CHG is an antiseptic cleaner which kills germs and bonds with the skin to continue killing germs even after washing. Please DO NOT use if you have an allergy to CHG or antibacterial soaps.  If your skin becomes reddened/irritated stop using the CHG and inform your nurse when you arrive at [facility]. Do not shave (including legs and underarms) for at least 48 hours prior to the first CHG shower.  You may shave your face/neck. Please follow these instructions carefully:  1.  Shower with CHG Soap the night before surgery and the  morning of Surgery.  2.  If you choose to wash your hair, wash your hair first as usual with your  normal  shampoo.  3.  After you shampoo, rinse your hair and body thoroughly to remove the  shampoo.                           4.  Use CHG as you would any other liquid soap.  You can apply chg directly  to the skin and wash  Gently with a scrungie or clean washcloth.  5.  Apply the CHG Soap to your body ONLY FROM THE NECK DOWN.   Do not use on face/ open                           Wound or open sores. Avoid contact with eyes, ears mouth and genitals (private parts).                       Wash face,  Genitals (private parts) with your normal soap.             6.  Wash thoroughly, paying special attention to the area where your surgery  will be performed.  7.  Thoroughly rinse your body with warm water from the neck down.  8.  DO NOT shower/wash with your normal soap after using and rinsing off  the CHG Soap.                9.  Pat yourself dry with a clean towel.            10.  Wear clean pajamas.            11.  Place clean sheets on your bed the night of your first shower and do not  sleep with pets. Day of Surgery : Do not apply any lotions/deodorants the morning of surgery.  Please wear clean clothes to the hospital/surgery center.  FAILURE TO FOLLOW THESE INSTRUCTIONS MAY RESULT IN THE CANCELLATION OF YOUR SURGERY PATIENT SIGNATURE_________________________________  NURSE SIGNATURE__________________________________  ________________________________________________________________________  WHAT IS A BLOOD TRANSFUSION? Blood Transfusion Information  A transfusion is the replacement of blood or some of its parts. Blood is made up of  multiple cells which provide different functions.  Red blood cells carry oxygen and are used for blood loss replacement.  White blood cells fight against infection.  Platelets control bleeding.  Plasma helps clot blood.  Other blood products are available for specialized needs, such as hemophilia or other clotting disorders. BEFORE THE TRANSFUSION  Who gives blood for transfusions?   Healthy volunteers who are fully evaluated to make sure their blood is safe. This is blood bank blood. Transfusion therapy is the safest it has ever been in the practice of medicine. Before blood is taken from a donor, a complete history is taken to make sure that person has no history of diseases nor engages in risky social behavior (examples are intravenous drug use or sexual activity with multiple partners). The donor's travel history is screened to minimize risk of transmitting infections, such as malaria. The donated blood is tested for signs of infectious diseases, such as HIV and hepatitis. The blood is then tested to be sure it is compatible with you in order to minimize the chance of a transfusion reaction. If you or a relative donates blood, this is often done in anticipation of surgery and is not appropriate for emergency situations. It takes many days to process the donated blood. RISKS AND COMPLICATIONS Although transfusion therapy is very safe and saves many lives, the main dangers of transfusion include:   Getting an infectious disease.  Developing a transfusion reaction. This is an allergic reaction to something in the blood you were given. Every precaution is taken to prevent this. The decision to have a blood transfusion has been considered carefully by your caregiver before blood is given. Blood is not given unless the benefits outweigh  the risks. AFTER THE TRANSFUSION  Right after receiving a blood transfusion, you will usually feel much better and more energetic. This is especially true if  your red blood cells have gotten low (anemic). The transfusion raises the level of the red blood cells which carry oxygen, and this usually causes an energy increase.  The nurse administering the transfusion will monitor you carefully for complications. HOME CARE INSTRUCTIONS  No special instructions are needed after a transfusion. You may find your energy is better. Speak with your caregiver about any limitations on activity for underlying diseases you may have. SEEK MEDICAL CARE IF:   Your condition is not improving after your transfusion.  You develop redness or irritation at the intravenous (IV) site. SEEK IMMEDIATE MEDICAL CARE IF:  Any of the following symptoms occur over the next 12 hours:  Shaking chills.  You have a temperature by mouth above 102 F (38.9 C), not controlled by medicine.  Chest, back, or muscle pain.  People around you feel you are not acting correctly or are confused.  Shortness of breath or difficulty breathing.  Dizziness and fainting.  You get a rash or develop hives.  You have a decrease in urine output.  Your urine turns a dark color or changes to pink, red, or brown. Any of the following symptoms occur over the next 10 days:  You have a temperature by mouth above 102 F (38.9 C), not controlled by medicine.  Shortness of breath.  Weakness after normal activity.  The white part of the eye turns yellow (jaundice).  You have a decrease in the amount of urine or are urinating less often.  Your urine turns a dark color or changes to pink, red, or brown. Document Released: 12/20/1999 Document Revised: 03/16/2011 Document Reviewed: 08/08/2007 ExitCare Patient Information 2014 [place].  _______________________________________________________________________  Incentive Spirometer  An incentive spirometer is a tool that can help keep your lungs clear and active. This tool measures how well you are filling your lungs with each breath.  Taking long deep breaths may help reverse or decrease the chance of developing breathing (pulmonary) problems (especially infection) following:  A long period of time when you are unable to move or be active. BEFORE THE PROCEDURE   If the spirometer includes an indicator to show your best effort, your nurse or respiratory therapist will set it to a desired goal.  If possible, sit up straight or lean slightly forward. Try not to slouch.  Hold the incentive spirometer in an upright position. INSTRUCTIONS FOR USE  1. Sit on the edge of your bed if possible, or sit up as far as you can in bed or on a chair. 2. Hold the incentive spirometer in an upright position. 3. Breathe out normally. 4. Place the mouthpiece in your mouth and seal your lips tightly around it. 5. Breathe in slowly and as deeply as possible, raising the piston or the ball toward the top of the column. 6. Hold your breath for 3-5 seconds or for as long as possible. Allow the piston or ball to fall to the bottom of the column. 7. Remove the mouthpiece from your mouth and breathe out normally. 8. Rest for a few seconds and repeat Steps 1 through 7 at least 10 times every 1-2 hours when you are awake. Take your time and take a few normal breaths between deep breaths. 9. The spirometer may include an indicator to show your best effort. Use the indicator as a goal to work  toward during each repetition. 10. After each set of 10 deep breaths, practice coughing to be sure your lungs are clear. If you have an incision (the cut made at the time of surgery), support your incision when coughing by placing a pillow or rolled up towels firmly against it. Once you are able to get out of bed, walk around indoors and cough well. You may stop using the incentive spirometer when instructed by your caregiver.  RISKS AND COMPLICATIONS  Take your time so you do not get dizzy or light-headed.  If you are in pain, you may need to take or ask for pain  medication before doing incentive spirometry. It is harder to take a deep breath if you are having pain. AFTER USE  Rest and breathe slowly and easily.  It can be helpful to keep track of a log of your progress. Your caregiver can provide you with a simple table to help with this. If you are using the spirometer at home, follow these instructions: [place] IF:   You are having difficultly using the spirometer.  You have trouble using the spirometer as often as instructed.  Your pain medication is not giving enough relief while using the spirometer.  You develop fever of 100.5 F (38.1 C) or higher. SEEK IMMEDIATE MEDICAL CARE IF:   You cough up bloody sputum that had not been present before.  You develop fever of 102 F (38.9 C) or greater.  You develop worsening pain at or near the incision site. MAKE SURE YOU:   Understand these instructions.  Will watch your condition.  Will get help right away if you are not doing well or get worse. Document Released: 05/04/2006 Document Revised: 03/16/2011 Document Reviewed: 07/05/2006 [facility] Patient Information 2014 [place], [place].   ________________________________________________________________________

## 2017-12-13 DIAGNOSIS — E7800 Pure hypercholesterolemia, unspecified: Secondary | ICD-10-CM | POA: Diagnosis not present

## 2017-12-13 DIAGNOSIS — E1165 Type 2 diabetes mellitus with hyperglycemia: Secondary | ICD-10-CM | POA: Diagnosis not present

## 2017-12-13 DIAGNOSIS — E039 Hypothyroidism, unspecified: Secondary | ICD-10-CM | POA: Diagnosis not present

## 2017-12-14 ENCOUNTER — Other Ambulatory Visit: Payer: Self-pay | Admitting: Pulmonary Disease

## 2017-12-14 MED FILL — DICLOFENAC SODIUM 75 MG TAB: 75 | 30 days supply | Qty: 60 | Fill #1

## 2017-12-14 MED FILL — BISOPROLOL FUMARATE 5 MG TA: 5 | 90 days supply | Qty: 45 | Fill #0

## 2017-12-14 MED FILL — EZETIMIBE 10 MG TABS: 10 | 30 days supply | Qty: 30 | Fill #3

## 2017-12-14 MED FILL — DOXEPIN 10 MG CAPSULE: 10 | 30 days supply | Qty: 30 | Fill #1

## 2017-12-14 MED FILL — hydrOXYzine HCL 25 MG TABS: 25 | 30 days supply | Qty: 30 | Fill #2

## 2017-12-15 ENCOUNTER — Other Ambulatory Visit: Payer: Self-pay

## 2017-12-15 ENCOUNTER — Encounter (HOSPITAL_COMMUNITY): Payer: Self-pay

## 2017-12-15 ENCOUNTER — Encounter (HOSPITAL_COMMUNITY)
Admission: RE | Admit: 2017-12-15 | Discharge: 2017-12-15 | Disposition: A | Discharge status: Home / Self Care | Payer: 59 | Source: Ambulatory Visit | Attending: Orthopedic Surgery | Admitting: Orthopedic Surgery

## 2017-12-15 DIAGNOSIS — M1611 Unilateral primary osteoarthritis, right hip: Secondary | ICD-10-CM | POA: Diagnosis not present

## 2017-12-15 DIAGNOSIS — Z01818 Encounter for other preprocedural examination: Secondary | ICD-10-CM | POA: Diagnosis not present

## 2017-12-15 LAB — CBC
HCT: 44.5 % (ref 36.0–46.0)
Hemoglobin: 14.3 g/dL (ref 12.0–15.0)
MCH: 28.9 pg (ref 26.0–34.0)
MCHC: 32.1 g/dL (ref 30.0–36.0)
MCV: 89.9 fL (ref 80.0–100.0)
Platelets: 268 10*3/uL (ref 150–400)
RBC: 4.95 MIL/uL (ref 3.87–5.11)
RDW: 13.9 % (ref 11.5–15.5)
WBC: 7.7 10*3/uL (ref 4.0–10.5)
nRBC: 0 % (ref 0.0–0.2)

## 2017-12-15 LAB — SURGICAL PCR SCREEN
MRSA, PCR: NEGATIVE
Staphylococcus aureus: NEGATIVE

## 2017-12-15 MED FILL — MONTELUKAST SOD 10 MG TAB: 10 | 30 days supply | Qty: 30 | Fill #0

## 2017-12-15 NOTE — Progress Notes (Signed)
LOV and Clearance 11-01-17 chart ekg 11-01-17 epic

## 2017-12-16 LAB — ABO/RH: ABO/RH(D): O POS

## 2017-12-20 DIAGNOSIS — E039 Hypothyroidism, unspecified: Secondary | ICD-10-CM | POA: Diagnosis not present

## 2017-12-20 DIAGNOSIS — E7800 Pure hypercholesterolemia, unspecified: Secondary | ICD-10-CM | POA: Diagnosis not present

## 2017-12-20 DIAGNOSIS — E1165 Type 2 diabetes mellitus with hyperglycemia: Secondary | ICD-10-CM | POA: Diagnosis not present

## 2017-12-20 DIAGNOSIS — I10 Essential (primary) hypertension: Secondary | ICD-10-CM | POA: Diagnosis not present

## 2017-12-21 ENCOUNTER — Other Ambulatory Visit: Payer: Self-pay

## 2017-12-21 ENCOUNTER — Encounter (HOSPITAL_COMMUNITY): Payer: Self-pay | Admitting: Emergency Medicine

## 2017-12-21 ENCOUNTER — Encounter (HOSPITAL_COMMUNITY)
Admission: RE | Disposition: A | Discharge status: Home / Self Care | Payer: Self-pay | Source: Home / Self Care | Attending: Orthopedic Surgery

## 2017-12-21 ENCOUNTER — Inpatient Hospital Stay (HOSPITAL_COMMUNITY): Payer: 59

## 2017-12-21 ENCOUNTER — Inpatient Hospital Stay (HOSPITAL_COMMUNITY): Payer: 59 | Admitting: Certified Registered Nurse Anesthetist

## 2017-12-21 ENCOUNTER — Inpatient Hospital Stay (HOSPITAL_COMMUNITY)
Admission: RE | Admit: 2017-12-21 | Discharge: 2017-12-22 | DRG: 470 | Disposition: A | Discharge status: Home / Self Care | Payer: 59 | Attending: Orthopedic Surgery | Admitting: Orthopedic Surgery

## 2017-12-21 DIAGNOSIS — Z8249 Family history of ischemic heart disease and other diseases of the circulatory system: Secondary | ICD-10-CM | POA: Diagnosis not present

## 2017-12-21 DIAGNOSIS — Z6834 Body mass index (BMI) 34.0-34.9, adult: Secondary | ICD-10-CM | POA: Diagnosis not present

## 2017-12-21 DIAGNOSIS — E66811 Obesity, class 1: Secondary | ICD-10-CM | POA: Diagnosis present

## 2017-12-21 DIAGNOSIS — Z888 Allergy status to other drugs, medicaments and biological substances status: Secondary | ICD-10-CM

## 2017-12-21 DIAGNOSIS — Z96641 Presence of right artificial hip joint: Secondary | ICD-10-CM

## 2017-12-21 DIAGNOSIS — Z7902 Long term (current) use of antithrombotics/antiplatelets: Secondary | ICD-10-CM

## 2017-12-21 DIAGNOSIS — Z471 Aftercare following joint replacement surgery: Secondary | ICD-10-CM | POA: Diagnosis not present

## 2017-12-21 DIAGNOSIS — I2510 Atherosclerotic heart disease of native coronary artery without angina pectoris: Secondary | ICD-10-CM | POA: Diagnosis present

## 2017-12-21 DIAGNOSIS — E785 Hyperlipidemia, unspecified: Secondary | ICD-10-CM | POA: Diagnosis present

## 2017-12-21 DIAGNOSIS — I10 Essential (primary) hypertension: Secondary | ICD-10-CM | POA: Diagnosis present

## 2017-12-21 DIAGNOSIS — Z79899 Other long term (current) drug therapy: Secondary | ICD-10-CM

## 2017-12-21 DIAGNOSIS — I252 Old myocardial infarction: Secondary | ICD-10-CM

## 2017-12-21 DIAGNOSIS — Z7951 Long term (current) use of inhaled steroids: Secondary | ICD-10-CM

## 2017-12-21 DIAGNOSIS — E669 Obesity, unspecified: Secondary | ICD-10-CM | POA: Diagnosis present

## 2017-12-21 DIAGNOSIS — J449 Chronic obstructive pulmonary disease, unspecified: Secondary | ICD-10-CM | POA: Diagnosis present

## 2017-12-21 DIAGNOSIS — Z955 Presence of coronary angioplasty implant and graft: Secondary | ICD-10-CM

## 2017-12-21 DIAGNOSIS — Z7982 Long term (current) use of aspirin: Secondary | ICD-10-CM | POA: Diagnosis not present

## 2017-12-21 DIAGNOSIS — M1611 Unilateral primary osteoarthritis, right hip: Secondary | ICD-10-CM | POA: Diagnosis not present

## 2017-12-21 DIAGNOSIS — E119 Type 2 diabetes mellitus without complications: Secondary | ICD-10-CM | POA: Diagnosis present

## 2017-12-21 DIAGNOSIS — Z96649 Presence of unspecified artificial hip joint: Secondary | ICD-10-CM

## 2017-12-21 DIAGNOSIS — M25551 Pain in right hip: Secondary | ICD-10-CM | POA: Diagnosis present

## 2017-12-21 DIAGNOSIS — Z87891 Personal history of nicotine dependence: Secondary | ICD-10-CM | POA: Diagnosis not present

## 2017-12-21 DIAGNOSIS — Z419 Encounter for procedure for purposes other than remedying health state, unspecified: Secondary | ICD-10-CM

## 2017-12-21 HISTORY — PX: TOTAL HIP ARTHROPLASTY: SHX124

## 2017-12-21 LAB — GLUCOSE, CAPILLARY
GLUCOSE-CAPILLARY: 117 mg/dL — AB (ref 70–99)
Glucose-Capillary: 121 mg/dL — ABNORMAL HIGH (ref 70–99)
Glucose-Capillary: 129 mg/dL — ABNORMAL HIGH (ref 70–99)
Glucose-Capillary: 234 mg/dL — ABNORMAL HIGH (ref 70–99)

## 2017-12-21 LAB — TYPE AND SCREEN
ABO/RH(D): O POS
Antibody Screen: NEGATIVE

## 2017-12-21 SURGERY — ARTHROPLASTY, HIP, TOTAL, ANTERIOR APPROACH
Anesthesia: Spinal | Site: Hip | Laterality: Right

## 2017-12-21 MED ORDER — FENTANYL CITRATE (PF) 100 MCG/2ML IJ SOLN: 25.0000 ug | INTRAMUSCULAR | Status: DC | PRN

## 2017-12-21 MED ORDER — PROPOFOL 10 MG/ML IV BOLUS
INTRAVENOUS | Status: AC
  Filled 2017-12-21: qty 80

## 2017-12-21 MED ORDER — EPHEDRINE SULFATE-NACL 50-0.9 MG/10ML-% IV SOSY
PREFILLED_SYRINGE | INTRAVENOUS | Status: DC | PRN
  Administered 2017-12-21 (×2): 10 mg via INTRAVENOUS
  Administered 2017-12-21: 5 mg via INTRAVENOUS

## 2017-12-21 MED ORDER — HYDROCODONE-ACETAMINOPHEN 7.5-325 MG PO TABS: 1.0000 | ORAL_TABLET | ORAL | 0 refills | Status: DC | PRN

## 2017-12-21 MED ORDER — IRBESARTAN 150 MG PO TABS
150.0000 mg | ORAL_TABLET | Freq: Every day | ORAL | Status: DC
  Filled 2017-12-21: qty 1

## 2017-12-21 MED ORDER — MIDAZOLAM HCL 2 MG/2ML IJ SOLN
INTRAMUSCULAR | Status: AC
  Filled 2017-12-21: qty 2

## 2017-12-21 MED ORDER — METOCLOPRAMIDE HCL 5 MG PO TABS: 5.0000 mg | ORAL_TABLET | Freq: Three times a day (TID) | ORAL | Status: DC | PRN

## 2017-12-21 MED ORDER — PHENOL 1.4 % MT LIQD: 1.0000 | OROMUCOSAL | Status: DC | PRN

## 2017-12-21 MED ORDER — SODIUM CHLORIDE 0.9 % IV SOLN
INTRAVENOUS | Status: DC | PRN
  Administered 2017-12-21: 50 ug/min via INTRAVENOUS

## 2017-12-21 MED ORDER — DEXAMETHASONE SODIUM PHOSPHATE 10 MG/ML IJ SOLN
10.0000 mg | Freq: Once | INTRAMUSCULAR | Status: AC
  Administered 2017-12-22: 10 mg via INTRAVENOUS
  Filled 2017-12-21: qty 1

## 2017-12-21 MED ORDER — TRANEXAMIC ACID-NACL 1000-0.7 MG/100ML-% IV SOLN
1000.0000 mg | Freq: Once | INTRAVENOUS | Status: AC
  Administered 2017-12-21: 1000 mg via INTRAVENOUS
  Filled 2017-12-21: qty 100

## 2017-12-21 MED ORDER — FENTANYL CITRATE (PF) 100 MCG/2ML IJ SOLN
INTRAMUSCULAR | Status: DC | PRN
  Administered 2017-12-21: 100 ug via INTRAVENOUS

## 2017-12-21 MED ORDER — BUPIVACAINE IN DEXTROSE 0.75-8.25 % IT SOLN
INTRATHECAL | Status: DC | PRN
  Administered 2017-12-21: 2 mL via INTRATHECAL

## 2017-12-21 MED ORDER — CELECOXIB 200 MG PO CAPS
200.0000 mg | ORAL_CAPSULE | Freq: Two times a day (BID) | ORAL | Status: DC
  Administered 2017-12-21 – 2017-12-22 (×2): 200 mg via ORAL
  Filled 2017-12-21 (×2): qty 1

## 2017-12-21 MED ORDER — POTASSIUM CHLORIDE CRYS ER 10 MEQ PO TBCR
10.0000 meq | EXTENDED_RELEASE_TABLET | Freq: Every day | ORAL | Status: DC
  Administered 2017-12-21 – 2017-12-22 (×2): 10 meq via ORAL
  Filled 2017-12-21 (×2): qty 1

## 2017-12-21 MED ORDER — IRBESARTAN-HYDROCHLOROTHIAZIDE 150-12.5 MG PO TABS: 1.0000 | ORAL_TABLET | Freq: Every day | ORAL | Status: DC

## 2017-12-21 MED ORDER — MORPHINE SULFATE (PF) 4 MG/ML IV SOLN: 0.5000 mg | INTRAVENOUS | Status: DC | PRN

## 2017-12-21 MED ORDER — METOCLOPRAMIDE HCL 5 MG/ML IJ SOLN: 10.0000 mg | Freq: Once | INTRAMUSCULAR | Status: DC | PRN

## 2017-12-21 MED ORDER — PROPOFOL 10 MG/ML IV BOLUS
INTRAVENOUS | Status: DC | PRN
  Administered 2017-12-21: 10 mg via INTRAVENOUS

## 2017-12-21 MED ORDER — MEPERIDINE HCL 50 MG/ML IJ SOLN: 6.2500 mg | INTRAMUSCULAR | Status: DC | PRN

## 2017-12-21 MED ORDER — DOCUSATE SODIUM 100 MG PO CAPS
100.0000 mg | ORAL_CAPSULE | Freq: Two times a day (BID) | ORAL | Status: DC
  Administered 2017-12-21 – 2017-12-22 (×2): 100 mg via ORAL
  Filled 2017-12-21 (×2): qty 1

## 2017-12-21 MED ORDER — INSULIN ASPART 100 UNIT/ML ~~LOC~~ SOLN
0.0000 [IU] | Freq: Three times a day (TID) | SUBCUTANEOUS | Status: DC
  Administered 2017-12-21: 2 [IU] via SUBCUTANEOUS
  Administered 2017-12-21: 3 [IU] via SUBCUTANEOUS
  Administered 2017-12-22: 2 [IU] via SUBCUTANEOUS
  Administered 2017-12-22: 8 [IU] via SUBCUTANEOUS

## 2017-12-21 MED ORDER — METHOCARBAMOL 500 MG PO TABS
500.0000 mg | ORAL_TABLET | Freq: Four times a day (QID) | ORAL | Status: DC | PRN
  Administered 2017-12-21 – 2017-12-22 (×2): 500 mg via ORAL
  Filled 2017-12-21 (×2): qty 1

## 2017-12-21 MED ORDER — PROPOFOL 500 MG/50ML IV EMUL
INTRAVENOUS | Status: DC | PRN
  Administered 2017-12-21: 75 ug/kg/min via INTRAVENOUS

## 2017-12-21 MED ORDER — CEFAZOLIN SODIUM-DEXTROSE 2-4 GM/100ML-% IV SOLN
2.0000 g | Freq: Four times a day (QID) | INTRAVENOUS | Status: AC
  Administered 2017-12-21 (×2): 2 g via INTRAVENOUS
  Filled 2017-12-21 (×2): qty 100

## 2017-12-21 MED ORDER — ALBUTEROL SULFATE (2.5 MG/3ML) 0.083% IN NEBU: 3.0000 mL | INHALATION_SOLUTION | Freq: Four times a day (QID) | RESPIRATORY_TRACT | Status: DC | PRN

## 2017-12-21 MED ORDER — DOXEPIN HCL 10 MG PO CAPS
10.0000 mg | ORAL_CAPSULE | Freq: Every evening | ORAL | Status: DC | PRN
  Administered 2017-12-22: 10 mg via ORAL
  Filled 2017-12-21 (×2): qty 1

## 2017-12-21 MED ORDER — ONDANSETRON HCL 4 MG/2ML IJ SOLN
INTRAMUSCULAR | Status: AC
  Filled 2017-12-21: qty 2

## 2017-12-21 MED ORDER — ONDANSETRON HCL 4 MG/2ML IJ SOLN: 4.0000 mg | Freq: Four times a day (QID) | INTRAMUSCULAR | Status: DC | PRN

## 2017-12-21 MED ORDER — METHOCARBAMOL 500 MG PO TABS: 500.0000 mg | ORAL_TABLET | Freq: Four times a day (QID) | ORAL | 0 refills | Status: DC | PRN

## 2017-12-21 MED ORDER — DIPHENHYDRAMINE HCL 12.5 MG/5ML PO ELIX: 12.5000 mg | ORAL_SOLUTION | ORAL | Status: DC | PRN

## 2017-12-21 MED ORDER — LIDOCAINE HCL (CARDIAC) PF 100 MG/5ML IV SOSY
PREFILLED_SYRINGE | INTRAVENOUS | Status: DC | PRN
  Administered 2017-12-21: 60 mg via INTRAVENOUS

## 2017-12-21 MED ORDER — GLIMEPIRIDE 2 MG PO TABS
2.0000 mg | ORAL_TABLET | Freq: Every day | ORAL | Status: DC
  Administered 2017-12-22: 2 mg via ORAL
  Filled 2017-12-21: qty 1

## 2017-12-21 MED ORDER — ROSUVASTATIN CALCIUM 40 MG PO TABS
40.0000 mg | ORAL_TABLET | Freq: Every day | ORAL | Status: DC
  Filled 2017-12-21: qty 1
  Filled 2017-12-21: qty 2

## 2017-12-21 MED ORDER — ALUM & MAG HYDROXIDE-SIMETH 200-200-20 MG/5ML PO SUSP: 15.0000 mL | ORAL | Status: DC | PRN

## 2017-12-21 MED ORDER — SODIUM CHLORIDE 0.9 % IV SOLN
INTRAVENOUS | Status: DC
  Administered 2017-12-21 – 2017-12-22 (×2): via INTRAVENOUS

## 2017-12-21 MED ORDER — HYDROCHLOROTHIAZIDE 12.5 MG PO CAPS
12.5000 mg | ORAL_CAPSULE | Freq: Every day | ORAL | Status: DC
  Filled 2017-12-21: qty 1

## 2017-12-21 MED ORDER — ASPIRIN EC 81 MG PO TBEC
81.0000 mg | DELAYED_RELEASE_TABLET | Freq: Every day | ORAL | Status: DC
  Administered 2017-12-22: 81 mg via ORAL
  Filled 2017-12-21: qty 1

## 2017-12-21 MED ORDER — MOMETASONE FURO-FORMOTEROL FUM 200-5 MCG/ACT IN AERO
2.0000 | INHALATION_SPRAY | Freq: Two times a day (BID) | RESPIRATORY_TRACT | Status: DC
  Administered 2017-12-21: 2 via RESPIRATORY_TRACT
  Filled 2017-12-21: qty 8.8

## 2017-12-21 MED ORDER — ACETAMINOPHEN 325 MG PO TABS: 325.0000 mg | ORAL_TABLET | Freq: Four times a day (QID) | ORAL | Status: DC | PRN

## 2017-12-21 MED ORDER — MIDAZOLAM HCL 5 MG/5ML IJ SOLN
INTRAMUSCULAR | Status: DC | PRN
  Administered 2017-12-21: 2 mg via INTRAVENOUS

## 2017-12-21 MED ORDER — METHOCARBAMOL 500 MG IVPB - SIMPLE MED
500.0000 mg | Freq: Four times a day (QID) | INTRAVENOUS | Status: DC | PRN
  Filled 2017-12-21: qty 50

## 2017-12-21 MED ORDER — LACTATED RINGERS IV SOLN
INTRAVENOUS | Status: DC | PRN
  Administered 2017-12-21 (×2): via INTRAVENOUS

## 2017-12-21 MED ORDER — STERILE WATER FOR IRRIGATION IR SOLN
Status: DC | PRN
  Administered 2017-12-21: 2000 mL

## 2017-12-21 MED ORDER — FENTANYL CITRATE (PF) 100 MCG/2ML IJ SOLN
INTRAMUSCULAR | Status: AC
  Filled 2017-12-21: qty 2

## 2017-12-21 MED ORDER — HYDROCODONE-ACETAMINOPHEN 7.5-325 MG PO TABS
1.0000 | ORAL_TABLET | ORAL | Status: DC | PRN
  Administered 2017-12-21 (×2): 1 via ORAL
  Filled 2017-12-21 (×2): qty 1

## 2017-12-21 MED ORDER — FERROUS SULFATE 325 (65 FE) MG PO TABS
325.0000 mg | ORAL_TABLET | Freq: Three times a day (TID) | ORAL | Status: DC
  Administered 2017-12-22: 325 mg via ORAL
  Filled 2017-12-21: qty 1

## 2017-12-21 MED ORDER — BISOPROLOL FUMARATE 5 MG PO TABS
2.5000 mg | ORAL_TABLET | Freq: Every day | ORAL | Status: DC
  Administered 2017-12-22: 2.5 mg via ORAL
  Filled 2017-12-21: qty 1

## 2017-12-21 MED ORDER — PHENYLEPHRINE HCL 10 MG/ML IJ SOLN
INTRAMUSCULAR | Status: AC
  Filled 2017-12-21: qty 1

## 2017-12-21 MED ORDER — CHLORHEXIDINE GLUCONATE 4 % EX LIQD: 60.0000 mL | Freq: Once | CUTANEOUS | Status: DC

## 2017-12-21 MED ORDER — CEFAZOLIN SODIUM-DEXTROSE 2-4 GM/100ML-% IV SOLN
2.0000 g | INTRAVENOUS | Status: AC
  Administered 2017-12-21: 2 g via INTRAVENOUS
  Filled 2017-12-21: qty 100

## 2017-12-21 MED ORDER — HYDROCODONE-ACETAMINOPHEN 5-325 MG PO TABS
1.0000 | ORAL_TABLET | ORAL | Status: DC | PRN
  Administered 2017-12-22: 2 via ORAL
  Filled 2017-12-21: qty 2

## 2017-12-21 MED ORDER — LORATADINE 10 MG PO TABS
10.0000 mg | ORAL_TABLET | Freq: Every day | ORAL | Status: DC
  Administered 2017-12-22: 10 mg via ORAL
  Filled 2017-12-21: qty 1

## 2017-12-21 MED ORDER — BISACODYL 10 MG RE SUPP: 10.0000 mg | Freq: Every day | RECTAL | Status: DC | PRN

## 2017-12-21 MED ORDER — HYDROXYZINE HCL 25 MG PO TABS: 25.0000 mg | ORAL_TABLET | Freq: Three times a day (TID) | ORAL | Status: DC | PRN

## 2017-12-21 MED ORDER — LACTATED RINGERS IV SOLN: INTRAVENOUS | Status: DC

## 2017-12-21 MED ORDER — LEVOTHYROXINE SODIUM 88 MCG PO TABS
88.0000 ug | ORAL_TABLET | Freq: Every day | ORAL | Status: DC
  Administered 2017-12-22: 88 ug via ORAL
  Filled 2017-12-21: qty 1

## 2017-12-21 MED ORDER — ONDANSETRON HCL 4 MG/2ML IJ SOLN
INTRAMUSCULAR | Status: DC | PRN
  Administered 2017-12-21: 4 mg via INTRAVENOUS

## 2017-12-21 MED ORDER — POLYETHYLENE GLYCOL 3350 17 G PO PACK
17.0000 g | PACK | Freq: Two times a day (BID) | ORAL | Status: DC
  Filled 2017-12-21 (×2): qty 1

## 2017-12-21 MED ORDER — CLOPIDOGREL BISULFATE 75 MG PO TABS
75.0000 mg | ORAL_TABLET | Freq: Every day | ORAL | Status: DC
  Administered 2017-12-22: 75 mg via ORAL
  Filled 2017-12-21: qty 1

## 2017-12-21 MED ORDER — DILTIAZEM HCL 60 MG PO TABS
120.0000 mg | ORAL_TABLET | Freq: Two times a day (BID) | ORAL | Status: DC
  Administered 2017-12-21 – 2017-12-22 (×2): 120 mg via ORAL
  Filled 2017-12-21 (×2): qty 2

## 2017-12-21 MED ORDER — DEXAMETHASONE SODIUM PHOSPHATE 10 MG/ML IJ SOLN
INTRAMUSCULAR | Status: AC
  Filled 2017-12-21: qty 1

## 2017-12-21 MED ORDER — ONDANSETRON HCL 4 MG PO TABS: 4.0000 mg | ORAL_TABLET | Freq: Four times a day (QID) | ORAL | Status: DC | PRN

## 2017-12-21 MED ORDER — TRANEXAMIC ACID-NACL 1000-0.7 MG/100ML-% IV SOLN
1000.0000 mg | INTRAVENOUS | Status: AC
  Administered 2017-12-21: 1000 mg via INTRAVENOUS
  Filled 2017-12-21: qty 100

## 2017-12-21 MED ORDER — MENTHOL 3 MG MT LOZG: 1.0000 | LOZENGE | OROMUCOSAL | Status: DC | PRN

## 2017-12-21 MED ORDER — LIDOCAINE 2% (20 MG/ML) 5 ML SYRINGE
INTRAMUSCULAR | Status: AC
  Filled 2017-12-21: qty 5

## 2017-12-21 MED ORDER — MONTELUKAST SODIUM 10 MG PO TABS
10.0000 mg | ORAL_TABLET | Freq: Every day | ORAL | Status: DC
  Administered 2017-12-21: 10 mg via ORAL
  Filled 2017-12-21: qty 1

## 2017-12-21 MED ORDER — SODIUM CHLORIDE 0.9 % IR SOLN
Status: DC | PRN
  Administered 2017-12-21: 1000 mL

## 2017-12-21 MED ORDER — METOCLOPRAMIDE HCL 5 MG/ML IJ SOLN: 5.0000 mg | Freq: Three times a day (TID) | INTRAMUSCULAR | Status: DC | PRN

## 2017-12-21 MED ORDER — EZETIMIBE 10 MG PO TABS
10.0000 mg | ORAL_TABLET | Freq: Every day | ORAL | Status: DC
  Administered 2017-12-22: 10 mg via ORAL
  Filled 2017-12-21: qty 1

## 2017-12-21 MED ORDER — MAGNESIUM CITRATE PO SOLN: 1.0000 | Freq: Once | ORAL | Status: DC | PRN

## 2017-12-21 MED ORDER — DEXAMETHASONE SODIUM PHOSPHATE 10 MG/ML IJ SOLN
10.0000 mg | Freq: Once | INTRAMUSCULAR | Status: AC
  Administered 2017-12-21 (×2): 10 mg via INTRAVENOUS

## 2017-12-21 SURGICAL SUPPLY — 45 items
ADH SKN CLS APL DERMABOND .7 (GAUZE/BANDAGES/DRESSINGS) ×1
BAG DECANTER FOR FLEXI CONT (MISCELLANEOUS) IMPLANT
BAG SPEC THK2 15X12 ZIP CLS (MISCELLANEOUS)
BAG ZIPLOCK 12X15 (MISCELLANEOUS) IMPLANT
BLADE SAG 18X100X1.27 (BLADE) ×2 IMPLANT
BLADE SURG SZ10 CARB STEEL (BLADE) ×4 IMPLANT
COVER PERINEAL POST (MISCELLANEOUS) ×2 IMPLANT
COVER SURGICAL LIGHT HANDLE (MISCELLANEOUS) ×2 IMPLANT
COVER WAND RF STERILE (DRAPES) ×1 IMPLANT
CUP ACET PINNACLE SECTR 50MM (Hips) IMPLANT
DERMABOND ADVANCED (GAUZE/BANDAGES/DRESSINGS) ×1
DERMABOND ADVANCED .7 DNX12 (GAUZE/BANDAGES/DRESSINGS) ×1 IMPLANT
DRAPE STERI IOBAN 125X83 (DRAPES) ×2 IMPLANT
DRAPE U-SHAPE 47X51 STRL (DRAPES) ×4 IMPLANT
DRESSING AQUACEL AG SP 3.5X10 (GAUZE/BANDAGES/DRESSINGS) ×1 IMPLANT
DRSG AQUACEL AG SP 3.5X10 (GAUZE/BANDAGES/DRESSINGS) ×2
DURAPREP 26ML APPLICATOR (WOUND CARE) ×2 IMPLANT
ELECT REM PT RETURN 15FT ADLT (MISCELLANEOUS) ×2 IMPLANT
ELIMINATOR HOLE APEX DEPUY (Hips) ×1 IMPLANT
GLOVE BIOGEL M STRL SZ7.5 (GLOVE) ×1 IMPLANT
GLOVE BIOGEL PI IND STRL 7.5 (GLOVE) ×1 IMPLANT
GLOVE BIOGEL PI IND STRL 8.5 (GLOVE) ×1 IMPLANT
GLOVE BIOGEL PI INDICATOR 7.5 (GLOVE) ×1
GLOVE BIOGEL PI INDICATOR 8.5 (GLOVE) ×1
GLOVE ECLIPSE 8.0 STRL XLNG CF (GLOVE) ×4 IMPLANT
GLOVE ORTHO TXT STRL SZ7.5 (GLOVE) ×2 IMPLANT
GOWN STRL REUS W/TWL 2XL LVL3 (GOWN DISPOSABLE) ×2 IMPLANT
GOWN STRL REUS W/TWL LRG LVL3 (GOWN DISPOSABLE) ×2 IMPLANT
HEAD FEMORAL 32 CERAMIC (Hips) ×1 IMPLANT
HOLDER FOLEY CATH W/STRAP (MISCELLANEOUS) ×2 IMPLANT
LINER ACET PNNCL PLUS4 NEUTRAL (Hips) IMPLANT
PACK ANTERIOR HIP CUSTOM (KITS) ×2 IMPLANT
PINNACLE PLUS 4 NEUTRAL (Hips) ×2 IMPLANT
PINNACLE SECTOR CUP 50MM (Hips) ×2 IMPLANT
SCREW 6.5MMX30MM (Screw) ×1 IMPLANT
STEM FEM ACTIS HIGH SZ1 (Stem) ×1 IMPLANT
SUT MNCRL AB 4-0 PS2 18 (SUTURE) ×2 IMPLANT
SUT STRATAFIX 0 PDS 27 VIOLET (SUTURE) ×2
SUT VIC AB 1 CT1 36 (SUTURE) ×6 IMPLANT
SUT VIC AB 2-0 CT1 27 (SUTURE) ×4
SUT VIC AB 2-0 CT1 TAPERPNT 27 (SUTURE) ×2 IMPLANT
SUTURE STRATFX 0 PDS 27 VIOLET (SUTURE) ×1 IMPLANT
TRAY FOLEY MTR SLVR 14FR STAT (SET/KITS/TRAYS/PACK) ×1 IMPLANT
WATER STERILE IRR 1000ML POUR (IV SOLUTION) ×2 IMPLANT
YANKAUER SUCT BULB TIP 10FT TU (MISCELLANEOUS) ×1 IMPLANT

## 2017-12-21 NOTE — Anesthesia Procedure Notes (Addendum)
Spinal  Patient location during procedure: OR End time: 12/21/2017 7:25 AM Staffing Anesthesiologist: Montez Hageman, MD Performed: anesthesiologist  Preanesthetic Checklist Completed: patient identified, site marked, surgical consent, pre-op evaluation, timeout performed, IV checked, risks and benefits discussed and monitors and equipment checked Spinal Block Patient position: sitting Prep: DuraPrep Patient monitoring: heart rate, continuous pulse ox and blood pressure Approach: midline Location: L3-4 Injection technique: single-shot Needle Needle type: Sprotte  Needle gauge: 22 G Needle length: 9 cm Assessment Sensory level: T6 Additional Notes Expiration date of kit checked and confirmed. Patient tolerated procedure well, without complications.

## 2017-12-21 NOTE — Discharge Instructions (Signed)

## 2017-12-21 NOTE — Evaluation (Signed)
Physical Therapy Evaluation Patient Details Name: Shelley [name] MRN: [id number] DOB: July 26, 1955 Today's Date: 12/21/2017   History of Present Illness  62 yo female s/p R DA-THA on 12/21/17. PMH includes COPD, MI with cardiac cath/multiple stent placements, HTN, obesity, DMII.   Clinical Impression   Pt presents with R hip pain, post-surgical R hip weakness, difficulty performing bed mobility, and increased time and effort to perform mobility tasks. Pt to benefit from acute PT to address deficits. Pt ambulated 60 ft with RW with min guard assist for safety, verbal cuing provided throughout. Pt educated on quad sets (5-10/hour), ankle pumps (20/hour), and heel slides (5-10/hour) to perform this afternoon/evening to lessen stiffness and increase circulation, to pt's tolerance and limited by pain. PT to progress mobility as tolerated, and will continue to follow acutely.      Follow Up Recommendations Follow surgeon's recommendation for DC plan and follow-up therapies;Supervision for mobility/OOB(HEP )    Equipment Recommendations  None recommended by PT    Recommendations for Other Services       Precautions / Restrictions Precautions Precautions: Fall Restrictions Weight Bearing Restrictions: No Other Position/Activity Restrictions: WBAT       Mobility  Bed Mobility Overal bed mobility: Needs Assistance Bed Mobility: Supine to Sit     Supine to sit: Min assist;HOB elevated     General bed mobility comments: Min assist for RLE lifting and translation to EOB. Pt with increased time and effort to scoot to EOB.   Transfers Overall transfer level: Needs assistance Equipment used: Rolling walker (2 wheeled) Transfers: Sit to/from Stand Sit to Stand: Min guard;From elevated surface         General transfer comment: min guard for safety. Verbal cuing for hand placement. Pt self-steadying with RW upon standing.   Ambulation/Gait Ambulation/Gait assistance: Min  guard;+2 safety/equipment(chair follow ) Gait Distance (Feet): 60 Feet Assistive device: Rolling walker (2 wheeled) Gait Pattern/deviations: Step-to pattern;Decreased weight shift to right;Decreased step length - right Gait velocity: decr    General Gait Details: Min guard for safety, verbal cuing for placement in RW, turning, sequencing.   Stairs            Wheelchair Mobility    Modified Rankin (Stroke Patients Only)       Balance Overall balance assessment: Mild deficits observed, not formally tested                                           Pertinent Vitals/Pain Pain Assessment: 0-10 Pain Score: 4  Pain Location: R hip  Pain Descriptors / Indicators: Sore Pain Intervention(s): Limited activity within patient's tolerance;Repositioned;Ice applied;Monitored during session    [place] expects to be discharged to:: Private residence Living Arrangements: Spouse/significant other;Children Available Help at Discharge: Family;Available PRN/intermittently Type of Home: House Home Access: Stairs to enter Entrance Stairs-Rails: None Entrance Stairs-Number of Steps: 1 Home Layout: One level Home Equipment: Bedside commode;Walker - 2 wheels;Cane - single point      Prior Function Level of Independence: Independent               Hand Dominance   Dominant Hand: Right    Extremity/Trunk Assessment   Upper Extremity Assessment Upper Extremity Assessment: Overall WFL for tasks assessed    Lower Extremity Assessment Lower Extremity Assessment: RLE deficits/detail RLE Deficits / Details: suspected post-surgical hip weakness; able to perform ankle pumps, SLR  with lift assist during bed mobility, and quad sets  RLE Sensation: WNL    Cervical / Trunk Assessment Cervical / Trunk Assessment: Normal  Communication   Communication: No difficulties  Cognition Arousal/Alertness: Awake/alert Behavior During Therapy: WFL for tasks  assessed/performed Overall Cognitive Status: Within Functional Limits for tasks assessed                                        General Comments      Exercises     Assessment/Plan    PT Assessment Patient needs continued PT services  PT Problem List Decreased strength;Pain;Decreased activity tolerance;Decreased knowledge of use of DME;Decreased balance;Decreased safety awareness;Decreased mobility       PT Treatment Interventions DME instruction;Therapeutic activities;Gait training;Therapeutic exercise;Patient/family education;Stair training;Balance training;Functional mobility training    PT Goals (Current goals can be found in the Care Plan section)  Acute Rehab PT Goals Patient Stated Goal: for my hip to feel better  PT Goal Formulation: With patient Time For Goal Achievement: 12/28/17 Potential to Achieve Goals: Good    Frequency 7X/week   Barriers to discharge        Co-evaluation               AM-PAC PT "6 Clicks" Mobility  Outcome Measure Help needed turning from your back to your side while in a flat bed without using bedrails?: A Little Help needed moving from lying on your back to sitting on the side of a flat bed without using bedrails?: A Little Help needed moving to and from a bed to a chair (including a wheelchair)?: A Little Help needed standing up from a chair using your arms (e.g., wheelchair or bedside chair)?: A Little Help needed to walk in hospital room?: A Little Help needed climbing 3-5 steps with a railing? : A Little 6 Click Score: 18    End of Session Equipment Utilized During Treatment: Gait belt Activity Tolerance: Patient tolerated treatment well Patient left: in chair;with chair alarm set;with SCD's reapplied;with call bell/phone within reach;with family/visitor present Nurse Communication: Mobility status PT Visit Diagnosis: Other abnormalities of gait and mobility (R26.89);Difficulty in walking, not elsewhere  classified (R26.2)    Time: 9983-3825 PT Time Calculation (min) (ACUTE ONLY): 24 min   Charges:   PT Evaluation $PT Eval Low Complexity: 1 Low PT Treatments $Gait Training: 8-22 mins        Julien Girt, PT Acute Rehabilitation Services Pager [phone]  Office [phone]   Shelley [name] 12/21/2017, 4:20 PM

## 2017-12-21 NOTE — Op Note (Signed)
NAME:  Shelley [name]                ACCOUNT NO.: [id number]      MEDICAL RECORD NO.: [id number]      FACILITY:  [facility]      PHYSICIAN:  Mauri Pole  DATE OF BIRTH:  09/29/1955     DATE OF PROCEDURE:  12/21/2017                                 OPERATIVE REPORT         PREOPERATIVE DIAGNOSIS: Right  hip osteoarthritis.      POSTOPERATIVE DIAGNOSIS:  Right hip osteoarthritis.      PROCEDURE:  Right total hip replacement through an anterior approach   utilizing DePuy THR system, component size 17mm pinnacle cup, a size 32+4 neutral   Altrex liner, a size 1 Hi Actis femoral stem with a 32+1 delta ceramic   ball.      SURGEON:  Pietro Cassis. Alvan Dame, M.D.      ASSISTANT:  Danae Orleans, PA-C     ANESTHESIA:  Spinal.      SPECIMENS:  None.      COMPLICATIONS:  None.      BLOOD LOSS:  400 cc     DRAINS:  None.      INDICATION OF THE PROCEDURE:  PALYN SCRIMA is a 62 y.o. female who had   presented to office for evaluation of right hip pain.  Radiographs revealed   progressive degenerative changes with bone-on-bone   articulation of the  hip joint, including subchondral cystic changes and osteophytes.  The patient had painful limited range of   motion significantly affecting their overall quality of life and function.  The patient was failing to    respond to conservative measures including medications and/or injections and activity modification and at this point was ready   to proceed with more definitive measures.  Consent was obtained for   benefit of pain relief.  Specific risks of infection, DVT, component   failure, dislocation, neurovascular injury, and need for revision surgery were reviewed in the office as well discussion of   the anterior versus posterior approach were reviewed.     PROCEDURE IN DETAIL:  The patient was brought to operative theater.   Once adequate anesthesia, preoperative antibiotics, 2 gm of Ancef, 1 gm of  Tranexamic Acid, and 10 mg of Decadron were administered, the patient was positioned supine on the [employer] table.  Once the patient was safely positioned with adequate padding of boney prominences we predraped out the hip, and used fluoroscopy to confirm orientation of the pelvis.      The right hip was then prepped and draped from proximal iliac crest to   mid thigh with a shower curtain technique.      Time-out was performed identifying the patient, planned procedure, and the appropriate extremity.     An incision was then made 2 cm lateral to the   anterior superior iliac spine extending over the orientation of the   tensor fascia lata muscle and sharp dissection was carried down to the   fascia of the muscle.      The fascia was then incised.  The muscle belly was identified and swept   laterally and retractor placed along the superior neck.  Following   cauterization of the circumflex vessels and removing some  pericapsular   fat, a second cobra retractor was placed on the inferior neck.  A T-capsulotomy was made along the line of the   superior neck to the trochanteric fossa, then extended proximally and   distally.  Tag sutures were placed and the retractors were then placed   intracapsular.  We then identified the trochanteric fossa and   orientation of my neck cut and then made a neck osteotomy with the femur on traction.  The femoral   head was removed without difficulty or complication.  Traction was let   off and retractors were placed posterior and anterior around the   acetabulum.      The labrum and foveal tissue were debrided.  I began reaming with a 44 mm   reamer and reamed up to 49 mm reamer with good bony bed preparation and a 50 mm  cup was chosen.  The final 50 mm Pinnacle cup was then impacted under fluoroscopy to confirm the depth of penetration and orientation with respect to   Abduction and forward flexion.  A screw was placed into the ilium followed by the hole  eliminator.  The final   32+4 neutral Altrex liner was impacted with good visualized rim fit.  The cup was positioned anatomically within the acetabular portion of the pelvis.      At this point, the femur was rolled to 100 degrees.  Further capsule was   released off the inferior aspect of the femoral neck.  I then   released the superior capsule proximally.  With the leg in a neutral position the hook was placed laterally   along the femur under the vastus lateralis origin and elevated manually and then held in position using the hook attachment on the bed.  The leg was then extended and adducted with the leg rolled to 100   degrees of external rotation.  Retractors were placed along the medial calcar and posteriorly over the greater trochanter.  Once the proximal femur was fully   exposed, I used a box osteotome to set orientation.  I then began   broaching with the starting chili pepper broach and passed this by hand and then broached up to 1.  With the 1 broach in place I chose a high offset neck and did several trial reductions.  The offset was appropriate, leg lengths   appeared to be equal best matched with the +1 head ball trial confirmed radiographically.   Given these findings, I went ahead and dislocated the hip, repositioned all   retractors and positioned the right hip in the extended and abducted position.  The final 1 Hi Actis femoral stem was   chosen and it was impacted down to the level of neck cut.  Based on this   and the trial reductions, a final 32+1 delta ceramic ball was chosen and   impacted onto a clean and dry trunnion, and the hip was reduced.  The   hip had been irrigated throughout the case again at this point.  I did   reapproximate the superior capsular leaflet to the anterior leaflet   using #1 Vicryl.  The fascia of the   tensor fascia lata muscle was then reapproximated using #1 Vicryl and #0 Stratafix sutures.  The   remaining wound was closed with 2-0 Vicryl  and running 4-0 Monocryl.   The hip was cleaned, dried, and dressed sterilely using Dermabond and   Aquacel dressing.  The patient was then brought  to recovery room in stable condition tolerating the procedure well.    Danae Orleans, PA-C was present for the entirety of the case involved from   preoperative positioning, perioperative retractor management, general   facilitation of the case, as well as primary wound closure as assistant.            Pietro Cassis Alvan Dame, M.D.        12/21/2017 8:44 AM

## 2017-12-21 NOTE — Anesthesia Postprocedure Evaluation (Signed)
Anesthesia Post Note  Patient: KARIN PINEDO  Procedure(s) Performed: TOTAL HIP ARTHROPLASTY ANTERIOR APPROACH (Right Hip)     Patient location during evaluation: PACU Anesthesia Type: Spinal Level of consciousness: awake and alert Pain management: pain level controlled Vital Signs Assessment: post-procedure vital signs reviewed and stable Respiratory status: spontaneous breathing and respiratory function stable Cardiovascular status: blood pressure returned to baseline and stable Postop Assessment: no headache, no backache, spinal receding and no apparent nausea or vomiting Anesthetic complications: no    Last Vitals:  Vitals:   12/21/17 1141 12/21/17 1351  BP: 115/72 123/73  Pulse: 66 70  Resp: 16 16  Temp: (!) 36.4 C 36.5 C  SpO2: 100% 99%    Last Pain:  Vitals:   12/21/17 1351  TempSrc: Oral  PainSc:                  Montez Hageman

## 2017-12-21 NOTE — Transfer of Care (Signed)
Immediate Anesthesia Transfer of Care Note  Patient: Shelley [name]  Procedure(s) Performed: TOTAL HIP ARTHROPLASTY ANTERIOR APPROACH (Right Hip)  Patient Location: PACU  Anesthesia Type:Spinal  Level of Consciousness: awake, alert , oriented and patient cooperative  Airway & Oxygen Therapy: Patient Spontanous Breathing and Patient connected to face mask oxygen  Post-op Assessment: Report given to RN and Post -op Vital signs reviewed and stable  Post vital signs: Reviewed and stable  Last Vitals:  Vitals Value Taken Time  BP 86/74 12/21/2017  9:21 AM  Temp    Pulse 89 12/21/2017  9:24 AM  Resp 20 12/21/2017  9:24 AM  SpO2 98 % 12/21/2017  9:24 AM  Vitals shown include unvalidated device data.  Last Pain:  Vitals:   12/21/17 0600  TempSrc: Oral  PainSc:       Patients Stated Pain Goal: 4 (93/73/42 8768)  Complications: No apparent anesthesia complications

## 2017-12-21 NOTE — Anesthesia Preprocedure Evaluation (Signed)
Anesthesia Evaluation  Patient identified by MRN, date of birth, ID band Patient awake    Reviewed: Allergy & Precautions, NPO status , Patient's Chart, lab work & pertinent test results  Airway Mallampati: II  TM Distance: >3 FB Neck ROM: Full    Dental no notable dental hx. (+) Edentulous Upper, Edentulous Lower   Pulmonary COPD, former smoker,    Pulmonary exam normal breath sounds clear to auscultation       Cardiovascular hypertension, + CAD, + Past MI and + Cardiac Stents  Normal cardiovascular exam Rhythm:Regular Rate:Normal     Neuro/Psych negative neurological ROS  negative psych ROS   GI/Hepatic negative GI ROS, Neg liver ROS,   Endo/Other  diabetes  Renal/GU negative Renal ROS  negative genitourinary   Musculoskeletal negative musculoskeletal ROS (+)   Abdominal   Peds negative pediatric ROS (+)  Hematology negative hematology ROS (+)   Anesthesia Other Findings   Reproductive/Obstetrics negative OB ROS                             Anesthesia Physical Anesthesia Plan  ASA: III  Anesthesia Plan: Spinal   Post-op Pain Management:    Induction:   PONV Risk Score and Plan: 2 and Ondansetron and Scopolamine patch - Pre-op  Airway Management Planned: Simple Face Mask  Additional Equipment:   Intra-op Plan:   Post-operative Plan:   Informed Consent: I have reviewed the patients History and Physical, chart, labs and discussed the procedure including the risks, benefits and alternatives for the proposed anesthesia with the patient or authorized representative who has indicated his/her understanding and acceptance.     Plan Discussed with:   Anesthesia Plan Comments: (Off plavix x 7 days)        Anesthesia Quick Evaluation

## 2017-12-21 NOTE — Interval H&P Note (Signed)
History and Physical Interval Note:  12/21/2017 7:00 AM  Shelley [name]  has presented today for surgery, with the diagnosis of Right hip osteoarthritis  The various methods of treatment have been discussed with the patient and family. After consideration of risks, benefits and other options for treatment, the patient has consented to  Procedure(s) with comments: TOTAL HIP ARTHROPLASTY ANTERIOR APPROACH (Right) - 70 mins as a surgical intervention .  The patient's history has been reviewed, patient examined, no change in status, stable for surgery.  I have reviewed the patient's chart and labs.  Questions were answered to the patient's satisfaction.     Mauri Pole

## 2017-12-22 ENCOUNTER — Encounter (HOSPITAL_COMMUNITY): Payer: Self-pay | Admitting: Orthopedic Surgery

## 2017-12-22 LAB — CBC
HCT: 39.3 % (ref 36.0–46.0)
Hemoglobin: 12.6 g/dL (ref 12.0–15.0)
MCH: 28.4 pg (ref 26.0–34.0)
MCHC: 32.1 g/dL (ref 30.0–36.0)
MCV: 88.7 fL (ref 80.0–100.0)
NRBC: 0 % (ref 0.0–0.2)
PLATELETS: 241 10*3/uL (ref 150–400)
RBC: 4.43 MIL/uL (ref 3.87–5.11)
RDW: 13.5 % (ref 11.5–15.5)
WBC: 13 10*3/uL — ABNORMAL HIGH (ref 4.0–10.5)

## 2017-12-22 LAB — BASIC METABOLIC PANEL
ANION GAP: 8 (ref 5–15)
BUN: 17 mg/dL (ref 8–23)
CO2: 23 mmol/L (ref 22–32)
Calcium: 8.5 mg/dL — ABNORMAL LOW (ref 8.9–10.3)
Chloride: 110 mmol/L (ref 98–111)
Creatinine, Ser: 0.84 mg/dL (ref 0.44–1.00)
GFR calc Af Amer: >60 mL/min (ref >60)
GFR calc non Af Amer: >60 mL/min (ref >60)
Glucose, Bld: 145 mg/dL — ABNORMAL HIGH (ref 70–99)
POTASSIUM: 3.7 mmol/L (ref 3.5–5.1)
Sodium: 141 mmol/L (ref 135–145)

## 2017-12-22 LAB — GLUCOSE, CAPILLARY
Glucose-Capillary: 181 mg/dL — ABNORMAL HIGH (ref 70–99)
Glucose-Capillary: 127 mg/dL — ABNORMAL HIGH (ref 70–99)
Glucose-Capillary: 252 mg/dL — ABNORMAL HIGH (ref 70–99)

## 2017-12-22 MED FILL — METHOCARBAMOL 500 MG TABS: 500 | 10 days supply | Qty: 40 | Fill #0

## 2017-12-22 MED FILL — HYDROCODON-APAP 7.5-325: 7.5-325 | 5 days supply | Qty: 60 | Fill #0

## 2017-12-22 NOTE — Progress Notes (Signed)
PT BID Note:   Second session for stair training with family present.  No further acute skilled PT.  For d/c home this pm.    12/22/17 1249  PT Visit Information  Last PT Received On 12/22/17  Assistance Needed +1  History of Present Illness 62 yo female s/p R DA-THA on 12/21/17. PMH includes COPD, MI with cardiac cath/multiple stent placements, HTN, obesity, DMII.   Subjective Data  Patient Stated Goal for my hip to feel better   Precautions  Precautions Fall  Restrictions  Other Position/Activity Restrictions WBAT   Pain Assessment  Pain Assessment No/denies pain  Cognition  Arousal/Alertness Awake/alert  Behavior During Therapy WFL for tasks assessed/performed  Overall Cognitive Status Within Functional Limits for tasks assessed  Bed Mobility  General bed mobility comments up in chair  Transfers  Overall transfer level Modified independent  General transfer comment from recliner and 3:1  Ambulation/Gait  Ambulation/Gait assistance Supervision  Gait Distance (Feet) 120 Feet  Assistive device Rolling walker (2 wheeled)  Gait Pattern/deviations Step-through pattern;Step-to pattern  Stairs Yes  Stairs assistance Supervision  Stair Management Step to pattern;With walker;Forwards  Number of Stairs 1  General stair comments educated on sequence and practiced twice  Balance  Overall balance assessment Mild deficits observed, not formally tested  PT - End of Session  Equipment Utilized During Treatment Gait belt  Activity Tolerance Patient tolerated treatment well  Patient left with family/visitor present;in chair   PT - Assessment/Plan  PT Plan Current plan remains appropriate  PT Visit Diagnosis Other abnormalities of gait and mobility (R26.89);Difficulty in walking, not elsewhere classified (R26.2)  PT Frequency (ACUTE ONLY) 7X/week  Follow Up Recommendations Follow surgeon's recommendation for DC plan and follow-up therapies;Supervision for mobility/OOB  PT equipment None  recommended by PT  AM-PAC PT "6 Clicks" Mobility Outcome Measure (Version 2)  Help needed turning from your back to your side while in a flat bed without using bedrails? 3  Help needed moving from lying on your back to sitting on the side of a flat bed without using bedrails? 3  Help needed moving to and from a bed to a chair (including a wheelchair)? 3  Help needed standing up from a chair using your arms (e.g., wheelchair or bedside chair)? 3  Help needed to walk in hospital room? 3  Help needed climbing 3-5 steps with a railing?  3  6 Click Score 18  Consider Recommendation of Discharge To: Home with [facility]  PT Goal Progression  Progress towards PT goals Progressing toward goals  PT Time Calculation  PT Start Time (ACUTE ONLY) 1220  PT Stop Time (ACUTE ONLY) 1240  PT Time Calculation (min) (ACUTE ONLY) 20 min  PT General Charges  $$ ACUTE PT VISIT 1 Visit  PT Treatments  $Gait Training 8-22 mins  Magda Kiel, [place] Acute Rehabilitation Services [phone] 12/22/2017

## 2017-12-22 NOTE — Progress Notes (Signed)
Physical Therapy Treatment Patient Details Name: Shelley [name] MRN: [id number] DOB: September 13, 1955 Today's Date: 12/22/2017    History of Present Illness 62 yo female s/p R DA-THA on 12/21/17. PMH includes COPD, MI with cardiac cath/multiple stent placements, HTN, obesity, DMII.     PT Comments    Patient progressing with mobility, HEP and ambulation.  Continues to be appropriate for home with family support likely later today.  Will see one more session for stair training.   Follow Up Recommendations  Follow surgeon's recommendation for DC plan and follow-up therapies;Supervision for mobility/OOB(HEP)     Equipment Recommendations  None recommended by PT    Recommendations for Other Services       Precautions / Restrictions Precautions Precautions: Fall Restrictions Other Position/Activity Restrictions: WBAT     Mobility  Bed Mobility Overal bed mobility: Needs Assistance Bed Mobility: Supine to Sit     Supine to sit: HOB elevated;Min assist     General bed mobility comments: very little assist with R LE pt used rail  Transfers Overall transfer level: Needs assistance Equipment used: Rolling walker (2 wheeled) Transfers: Sit to/from Stand Sit to Stand: Min guard         General transfer comment: initial posterior bias so minguard for safety and cues for anterior weight shift  Ambulation/Gait Ambulation/Gait assistance: Min Gaffer (Feet): 150 Feet Assistive device: Rolling walker (2 wheeled) Gait Pattern/deviations: Step-through pattern;Step-to pattern     General Gait Details: initially step to pattern, then with step through and more fluid movement of walker   Stairs             Wheelchair Mobility    Modified Rankin (Stroke Patients Only)       Balance Overall balance assessment: Mild deficits observed, not formally tested                                          Cognition  Arousal/Alertness: Awake/alert Behavior During Therapy: WFL for tasks assessed/performed Overall Cognitive Status: Within Functional Limits for tasks assessed                                        Exercises Total Joint Exercises Ankle Circles/Pumps: AROM;10 reps;Both;Supine Quad Sets: AROM;10 reps;Supine;Both Short Arc Quad: AROM;10 reps;Right;Supine Heel Slides: AROM;10 reps;Right;Supine;AAROM Hip ABduction/ADduction: AROM;10 reps;Right;Supine;AAROM    General Comments General comments (skin integrity, edema, etc.): issued HEP and reviewed during therex along with written precautions (no driving, stair sequence, use of walker, etc)      Pertinent Vitals/Pain Pain Assessment: Faces Faces Pain Scale: Hurts little more Pain Location: R hip with initial movements Pain Descriptors / Indicators: Cramping;Sore Pain Intervention(s): Monitored during session;Repositioned;Ice applied    Home Living                      Prior Function            PT Goals (current goals can now be found in the care plan section) Progress towards PT goals: Progressing toward goals    Frequency    7X/week      PT Plan Current plan remains appropriate    Co-evaluation              AM-PAC PT "6 Clicks" Mobility   Outcome Measure  Help needed turning from your back to your side while in a flat bed without using bedrails?: A Little Help needed moving from lying on your back to sitting on the side of a flat bed without using bedrails?: A Little Help needed moving to and from a bed to a chair (including a wheelchair)?: A Little Help needed standing up from a chair using your arms (e.g., wheelchair or bedside chair)?: A Little Help needed to walk in hospital room?: A Little Help needed climbing 3-5 steps with a railing? : A Little 6 Click Score: 18    End of Session Equipment Utilized During Treatment: Gait belt Activity Tolerance: Patient tolerated treatment  well Patient left: with call bell/phone within reach;in chair   PT Visit Diagnosis: Other abnormalities of gait and mobility (R26.89);Difficulty in walking, not elsewhere classified (R26.2)     Time: 0910-0950 PT Time Calculation (min) (ACUTE ONLY): 40 min  Charges:  $Gait Training: 8-22 mins $Therapeutic Exercise: 8-22 mins $Self Care/Home Management: [place], [place] [phone] 12/22/2017    Reginia Naas 12/22/2017, 10:01 AM

## 2017-12-22 NOTE — Care Management Note (Signed)
Case Management Note  Patient Details  Name: Shelley [name] MRN: [id number] Date of Birth: 26-Nov-1955  Subjective/Objective:       Spoke with patient at bedside. Confirmed plan for HEP. Has RW and 3n1. [phone]             Action/Plan:   Expected Discharge Date:  12/22/17               Expected Discharge Plan:  Home/Self Care  In-House Referral:  NA  Discharge planning Services  CM Consult  Post Acute Care Choice:  NA Choice offered to:  Patient  DME Arranged:  N/A DME Agency:  NA  HH Arranged:  NA HH Agency:  NA  Status of Service:  Completed, signed off  If discussed at [place] of Stay Meetings, dates discussed:    Additional Comments:  Guadalupe Maple, RN 12/22/2017, 9:49 AM

## 2017-12-22 NOTE — Progress Notes (Signed)
     Subjective: 1 Day Post-Op Procedure(s) (LRB): TOTAL HIP ARTHROPLASTY ANTERIOR APPROACH (Right)   Patient reports pain as mild, pain controlled. No events throughout the night.  Worked yesterday with therapy and felt good with the hip.  States that there is some soreness, but better than prior to surgery.  Ready to be discharged home if she does well with PT.    Objective:   VITALS:   Vitals:   12/22/17 0155 12/22/17 0533  BP: 103/66 107/64  Pulse: 69 62  Resp: 16 14  Temp: 98 F (36.7 C) 98.1 F (36.7 C)  SpO2: 99% 97%    Dorsiflexion/Plantar flexion intact Incision: dressing C/D/I No cellulitis present Compartment soft  LABS Recent Labs    12/22/17 0359  HGB 12.6  HCT 39.3  WBC 13.0*  PLT 241    Recent Labs    12/22/17 0359  NA 141  K 3.7  BUN 17  CREATININE 0.84  GLUCOSE 145*     Assessment/Plan: 1 Day Post-Op Procedure(s) (LRB): TOTAL HIP ARTHROPLASTY ANTERIOR APPROACH (Right) Foley cath d/c'ed Advance diet Up with therapy D/C IV fluids Discharge home Follow up in 2 weeks at [facility] ([place]). Follow up with OLIN,Shahla Betsill D in 2 weeks.  Contact information:  [facility]) [address] [place] [phone]    Obese (BMI 30-39.9) Estimated body mass index is 34.28 kg/m as calculated from the following:   Height as of this encounter: 5\' 5"  (1.651 m).   Weight as of this encounter: 93.4 kg. Patient also counseled that weight may inhibit the healing process Patient counseled that losing weight will help with future health issues        West Pugh. Travis Purk   PAC  12/22/2017, 9:16 AM

## 2017-12-22 NOTE — Plan of Care (Signed)
Patient to discharge home. Discharge instructions given to patient by charge nurse, Amy. F. RN

## 2017-12-22 NOTE — Plan of Care (Signed)
Plan of care reviewed and discussed with the patient. Questions answered to patient's satisfaction. 

## 2017-12-24 ENCOUNTER — Other Ambulatory Visit: Payer: Self-pay | Admitting: *Deleted

## 2017-12-24 NOTE — Patient Outreach (Signed)
[facility]) Care Management  12/24/2017  Shelley [name] [birth year] [id number]   Transition of care call  Subjective: Initial successful telephone call to patient's preferred number in order to complete transition of care assessment;  2 HIPAA identifiers verified. Explained purpose of call and completed transition of care assessment. Also assessed and/or reviewed: caregiver assistance, pain control with prescribed pain medications, medication reconciliation, medication taking behavior, ambulation ability, presence of ordered DME, bowel and bladder function, diet tolerance, and post-operative problems requiring MD notification. Shelley [name] reports her fasting blood sugar was 72 this morning. She says she is following her home exercise plan as directed. She says her surgical dressing is dry and intact. She says she is ambulating with a rolling walker with steady gait.   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 Comorbidities include: CAD s/p PTCA and stent placement, HTN, MI with ST elevation, COPD, Type 2 DM.  She was discharged to home on 1218/19 without the need for home health services .  Assessment:  See transition of care template for assessment details. .   Plan:  No care management needs identified so will close case to [place] [facility] care management services and route successful outreach letter to [place] [facility] clinical pool to be mailed to patient's home address.     Barrington Ellison RN,CCM,CDE [place] Management Coordinator Office Phone [phone] Office Fax [phone]

## 2017-12-27 MED FILL — POTASSIUM CHLORIDE CRYS ER: 10 | 30 days supply | Qty: 60 | Fill #1

## 2017-12-31 NOTE — Discharge Summary (Signed)
Physician Discharge Summary  Patient ID: Shelley [name] MRN: [id number] DOB/AGE: Dec 22, 1955 62 y.o.  Admit date: 12/21/2017 Discharge date: 12/22/2017   Procedures:  Procedure(s) (LRB): TOTAL HIP ARTHROPLASTY ANTERIOR APPROACH (Right)  Attending Physician:  Dr. Paralee Cancel   Admission Diagnoses:   Right hip primary OA / pain  Discharge Diagnoses:  Principal Problem:   S/P right THA, AA Active Problems:   Obesity (BMI 30.0-34.9)  Past Medical History:  Diagnosis Date  . CAD (coronary artery disease)   . Cardiac arrhythmia   . DES exposure in utero   . Diabetes mellitus   . Dyslipidemia   . Hypertension   . MI (myocardial infarction) ([place])   . Shingles   . Thyroid disease     HPI:    Shelley [name], 62 y.o. female, has a history of pain and functional disability in the right hip(s) due to arthritis and patient has failed non-surgical conservative treatments for greater than 12 weeks to include NSAID's and/or analgesics, corticosteriod injections and activity modification.  Onset of symptoms was gradual starting ~1 years ago with gradually worsening course since that time.The patient noted no past surgery on the right hip(s).  Patient currently rates pain in the right hip at 9 out of 10 with activity. Patient has night pain, worsening of pain with activity and weight bearing, trendelenberg gait, pain that interfers with activities of daily living and pain with passive range of motion. Patient has evidence of periarticular osteophytes and joint space narrowing by imaging studies. This condition presents safety issues increasing the risk of falls.  There is no current active infection.  Risks, benefits and expectations were discussed with the patient.  Risks including but not limited to the risk of anesthesia, blood clots, nerve damage, blood vessel damage, failure of the prosthesis, infection and up to and including death.  Patient understand the risks, benefits and  expectations and wishes to proceed with surgery.   PCP: [name], MD   Discharged Condition: good  Hospital Course:  Patient underwent the above stated procedure on 12/21/2017. Patient tolerated the procedure well and brought to the recovery room in good condition and subsequently to the floor.  POD #1 BP: 107/64 ; Pulse: 62 ; Temp: 98.1 F (36.7 C) ; Resp: 14 Patient reports pain as mild, pain controlled. No events throughout the night.  Worked yesterday with therapy and felt good with the hip.  States that there is some soreness, but better than prior to surgery.  Ready to be discharged home. Dorsiflexion/plantar flexion intact, incision: dressing C/D/I, no cellulitis present and compartment soft.   LABS  Basename    HGB     12.6  HCT     39.3    Discharge Exam: General appearance: alert, cooperative and no distress Extremities: Homans sign is negative, no sign of DVT, no edema, redness or tenderness in the calves or thighs and no ulcers, gangrene or trophic changes  Disposition:  Home with follow up in 2 weeks   Follow-up Information    Paralee Cancel, MD. Schedule an appointment as soon as possible for a visit in 2 weeks.   Specialty:  Orthopedic Surgery Contact information: [address] [place] [address] [phone]           Discharge Instructions    Call MD / Call 911   Complete by:  As directed    If you experience chest pain or shortness of breath, CALL 911 and be transported to  the hospital emergency room.  If you develope a fever above 101 F, pus (white drainage) or increased drainage or redness at the wound, or calf pain, call your surgeon's office.   Change dressing   Complete by:  As directed    Maintain surgical dressing until follow up in the clinic. If the edges start to pull up, may reinforce with tape. If the dressing is no longer working, may remove and cover with gauze and tape, but must keep the area dry and clean.  Call with  any questions or concerns.   Constipation Prevention   Complete by:  As directed    Drink plenty of fluids.  Prune juice may be helpful.  You may use a stool softener, such as Colace (over the counter) 100 mg twice a day.  Use MiraLax (over the counter) for constipation as needed.   Diet - low sodium heart healthy   Complete by:  As directed    Discharge instructions   Complete by:  As directed    Maintain surgical dressing until follow up in the clinic. If the edges start to pull up, may reinforce with tape. If the dressing is no longer working, may remove and cover with gauze and tape, but must keep the area dry and clean.  Follow up in 2 weeks at [facility] Call with any questions or concerns.   Increase activity slowly as tolerated   Complete by:  As directed    Weight bearing as tolerated with assist device (walker, cane, etc) as directed, use it as long as suggested by your surgeon or therapist, typically at least 4-6 weeks.   TED hose   Complete by:  As directed    Use stockings (TED hose) for 2 weeks on both leg(s).  You may remove them at night for sleeping.      Allergies as of 12/22/2017      Reactions   Coreg [carvedilol] Shortness Of Breath   Levothyroxine Itching   Patient thinks it was the dye in the tablets she was allergic to, she feels like "something is crawling under my skin"   Lopressor [metoprolol Tartrate] Cough   Fatigue   Metformin And Related Diarrhea   Norvasc [amlodipine Besylate] Palpitations   "made my heart race"      Medication List    STOP taking these medications   diclofenac 75 MG EC tablet Commonly known as:  VOLTAREN     TAKE these medications   ACCU-CHEK FASTCLIX LANCETS Misc   ACCU-CHEK GUIDE test strip Generic drug:  glucose blood   albuterol 108 (90 Base) MCG/ACT inhaler Commonly known as:  VENTOLIN HFA Inhale 2 puffs into the lungs every 6 (six) hours as needed for wheezing or shortness of breath.   ASPIRIN ADULT  LOW STRENGTH 81 MG EC tablet Generic drug:  aspirin TAKE 1 TABLET BY MOUTH ONCE DAILY   bisoprolol 5 MG tablet Commonly known as:  ZEBETA Take 0.5 tablets (2.5 mg total) by mouth daily.   budesonide-formoterol 160-4.5 MCG/ACT inhaler Commonly known as:  SYMBICORT Inhale 2 puffs into the lungs 2 (two) times daily. What changed:    when to take this  reasons to take this   clopidogrel 75 MG tablet Commonly known as:  PLAVIX Take 1 tablet (75 mg total) by mouth daily.   diltiazem 120 MG tablet Commonly known as:  CARDIZEM Take 1 tablet (120 mg total) by mouth 2 (two) times daily.   doxepin 10 MG capsule Commonly  known as:  SINEQUAN Take 10 mg by mouth at bedtime as needed (sleep).   ezetimibe 10 MG tablet Commonly known as:  ZETIA TAKE 1 TABLET BY MOUTH DAILY.   fexofenadine 180 MG tablet Commonly known as:  ALLEGRA Take 180 mg by mouth daily.   glimepiride 2 MG tablet Commonly known as:  AMARYL Take 2 mg by mouth daily with breakfast.   HYDROcodone-acetaminophen 7.5-325 MG tablet Commonly known as:  NORCO Take 1-2 tablets by mouth every 4 (four) hours as needed for moderate pain.   HYDROcodone-acetaminophen 7.5-325 MG tablet Commonly known as:  NORCO Take 1-2 tablets by mouth every 4 (four) hours as needed for moderate pain.   hydrOXYzine 25 MG tablet Commonly known as:  ATARAX/VISTARIL Take 25 mg by mouth 3 (three) times daily as needed (allergies).   irbesartan-hydrochlorothiazide 150-12.5 MG tablet Commonly known as:  AVALIDE Take 1 tablet by mouth daily.   levothyroxine 88 MCG tablet Commonly known as:  SYNTHROID, LEVOTHROID Take 88 mcg by mouth daily before breakfast.   methocarbamol 500 MG tablet Commonly known as:  ROBAXIN Take 1 tablet (500 mg total) by mouth every 6 (six) hours as needed for muscle spasms.   montelukast 10 MG tablet Commonly known as:  SINGULAIR TAKE 1 TABLET (10 MG TOTAL) BY MOUTH AT BEDTIME.   potassium chloride 10 MEQ  tablet Commonly known as:  K-DUR,KLOR-CON Take 2 tablets (20 mEq total) by mouth daily. What changed:  how much to take   rosuvastatin 40 MG tablet Commonly known as:  CRESTOR Take 40 mg by mouth daily.            Discharge Care Instructions  (From admission, onward)         Start     Ordered   12/22/17 0000  Change dressing    Comments:  Maintain surgical dressing until follow up in the clinic. If the edges start to pull up, may reinforce with tape. If the dressing is no longer working, may remove and cover with gauze and tape, but must keep the area dry and clean.  Call with any questions or concerns.   12/22/17 0919           Signed: West Pugh. Dalyn Kjos   PA-C  12/31/2017, 9:59 AM

## 2018-01-12 MED FILL — LEVOTHYROXINE 88 MCG TABLET: 88 | 90 days supply | Qty: 90 | Fill #0

## 2018-01-12 MED FILL — METHOCARBAMOL 500 MG TABS: 500 | 10 days supply | Qty: 40 | Fill #0

## 2018-01-12 MED FILL — HYDROCODON-APAP 7.5-325: 7.5-325 | 5 days supply | Qty: 60 | Fill #0

## 2018-01-21 MED FILL — EZETIMIBE 10 MG TABS: 10 | 30 days supply | Qty: 30 | Fill #4

## 2018-01-21 MED FILL — MONTELUKAST SOD 10 MG TAB: 10 | 30 days supply | Qty: 30 | Fill #1

## 2018-01-28 DIAGNOSIS — M25551 Pain in right hip: Secondary | ICD-10-CM | POA: Diagnosis not present

## 2018-01-28 DIAGNOSIS — Z471 Aftercare following joint replacement surgery: Secondary | ICD-10-CM | POA: Diagnosis not present

## 2018-01-28 DIAGNOSIS — Z96641 Presence of right artificial hip joint: Secondary | ICD-10-CM | POA: Diagnosis not present

## 2018-01-28 DIAGNOSIS — M7061 Trochanteric bursitis, right hip: Secondary | ICD-10-CM | POA: Diagnosis not present

## 2018-02-07 MED FILL — GLIMEPIRIDE 2 MG TABLET: 2 | 90 days supply | Qty: 90 | Fill #1

## 2018-02-07 MED FILL — DOXEPIN 10 MG CAPSULE: 10 | 30 days supply | Qty: 30 | Fill #2 | Status: TO

## 2018-02-07 MED FILL — IRBESARTAN-HCTZ 150-12.5 MG: 150-12.5 | 90 days supply | Qty: 90 | Fill #1

## 2018-02-07 MED FILL — hydrOXYzine HCL 25 MG TABS: 25 | 30 days supply | Qty: 30 | Fill #3 | Status: TO

## 2018-02-21 MED FILL — EZETIMIBE 10 MG TABS: 10 | 30 days supply | Qty: 30 | Fill #5

## 2018-02-21 MED FILL — CLOPIDOGREL 75 MG TABLET: 75 | 90 days supply | Qty: 90 | Fill #1

## 2018-02-21 MED FILL — MONTELUKAST SOD 10 MG TAB: 10 | 30 days supply | Qty: 30 | Fill #2 | Status: TO

## 2018-02-24 ENCOUNTER — Other Ambulatory Visit: Payer: Self-pay | Admitting: *Deleted

## 2018-02-24 MED ORDER — AZITHROMYCIN 250 MG PO TABS: ORAL_TABLET | ORAL | 0 refills | Status: DC

## 2018-02-24 MED FILL — AZITHROMYCIN 250 MG TABLET: 250 | 5 days supply | Qty: 6 | Fill #0

## 2018-03-11 ENCOUNTER — Other Ambulatory Visit: Payer: Self-pay | Admitting: Cardiovascular Disease

## 2018-03-11 MED FILL — POTASSIUM CHLORIDE CRYS ER: 10 | 30 days supply | Qty: 60 | Fill #0 | Status: TO

## 2018-03-11 MED FILL — ROSUVASTATIN CALCIUM 40 MG: 40 | 90 days supply | Qty: 90 | Fill #2

## 2018-03-25 MED FILL — EZETIMIBE 10 MG TABS: 10 | 90 days supply | Qty: 90 | Fill #6

## 2018-03-31 MED FILL — MONTELUKAST SOD 10 MG TAB: 10 | 30 days supply | Qty: 30 | Fill #0

## 2018-03-31 MED FILL — BISOPROLOL FUMARATE 5 MG TA: 5 | 90 days supply | Qty: 45 | Fill #0

## 2018-04-11 MED FILL — hydrOXYzine HCL 25 MG TABS: 25 | 30 days supply | Qty: 30 | Fill #0

## 2018-04-11 MED FILL — LEVOTHYROXINE 88 MCG TABLET: 88 | 90 days supply | Qty: 90 | Fill #0

## 2018-05-02 MED FILL — POTASSIUM CHL ER M10 TABLET: 10 | 30 days supply | Qty: 60 | Fill #0

## 2018-05-02 MED FILL — MONTELUKAST SOD 10 MG TAB: 10 | 30 days supply | Qty: 30 | Fill #1

## 2018-05-02 MED FILL — DOXEPIN 10 MG CAPSULE: 10 | 30 days supply | Qty: 30 | Fill #0

## 2018-05-02 MED FILL — dilTIAZem HCL 120 MG TABS: 120 | 90 days supply | Qty: 180 | Fill #0

## 2018-05-10 MED FILL — hydrOXYzine HCL 25 MG TABS: 25 | 30 days supply | Qty: 30 | Fill #1

## 2018-05-10 MED FILL — IRBESARTAN-HCTZ 150-12.5 MG: 150-12.5 | 90 days supply | Qty: 90 | Fill #0

## 2018-05-10 MED FILL — GLIMEPIRIDE 2 MG TABLET: 2 | 90 days supply | Qty: 90 | Fill #0

## 2018-05-13 ENCOUNTER — Other Ambulatory Visit: Payer: Self-pay | Admitting: Pharmacist

## 2018-05-24 MED FILL — CLOPIDOGREL 75 MG TABLET: 75 | 90 days supply | Qty: 90 | Fill #0

## 2018-06-03 MED FILL — DOXEPIN 10 MG CAPSULE: 10 | 30 days supply | Qty: 30 | Fill #1

## 2018-06-03 MED FILL — MONTELUKAST SOD 10 MG TAB: 10 | 30 days supply | Qty: 30 | Fill #2

## 2018-06-13 MED FILL — ROSUVASTATIN CALCIUM 40 MG: 40 | 90 days supply | Qty: 90 | Fill #3

## 2018-06-20 MED FILL — hydrOXYzine HCL 25 MG TABS: 25 | 30 days supply | Qty: 30 | Fill #0

## 2018-06-24 MED FILL — BISOPROLOL FUMARATE 5 MG TA: 5 | 90 days supply | Qty: 45 | Fill #0

## 2018-06-24 MED FILL — EZETIMIBE 10 MG TABS: 10 | 60 days supply | Qty: 60 | Fill #7

## 2018-06-28 IMAGING — DX DG CHEST 2V
2 series · 2 of 2 positions shown · non-contrast
Comparison: 07/31/2015

CLINICAL DATA: Dyspnea on exertion and cough

EXAM:
CHEST - 2 VIEW

[chest pa]
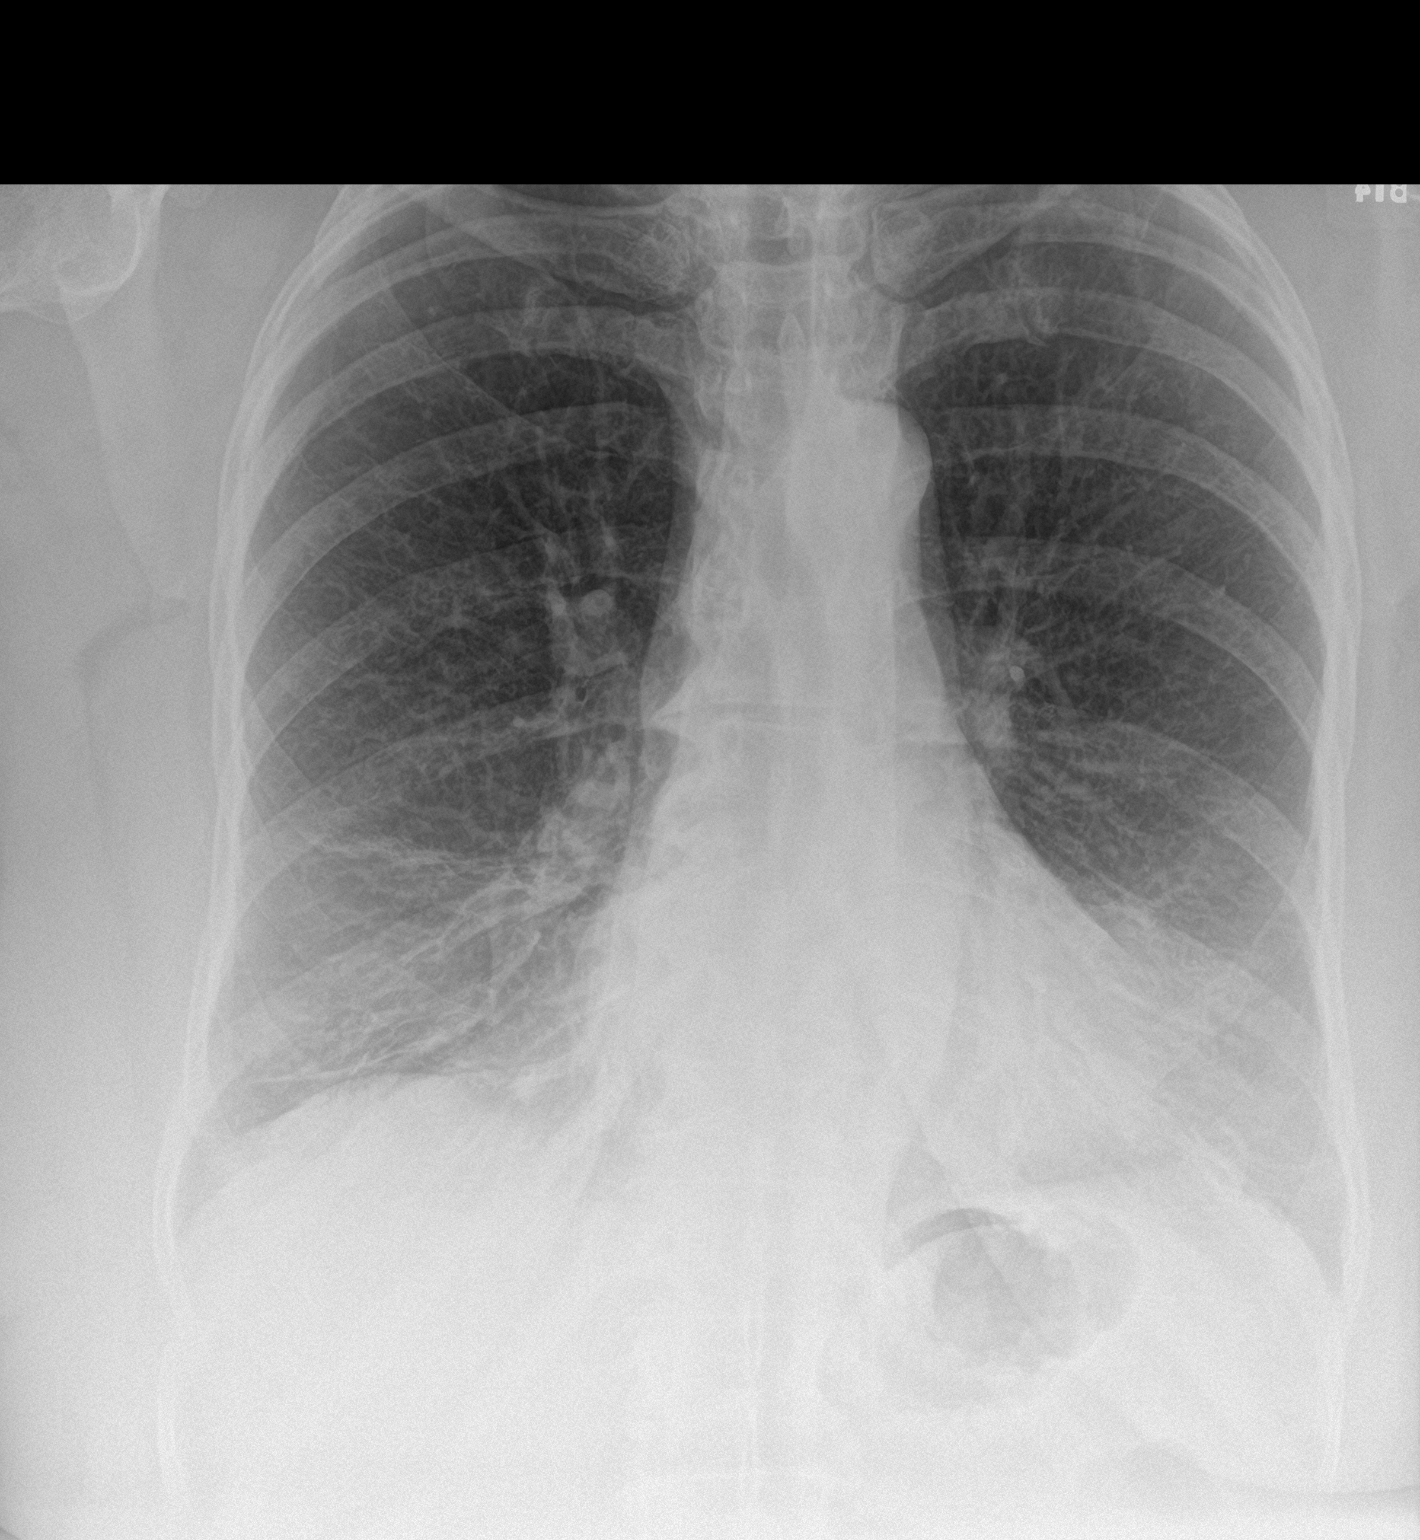

[chest lat]
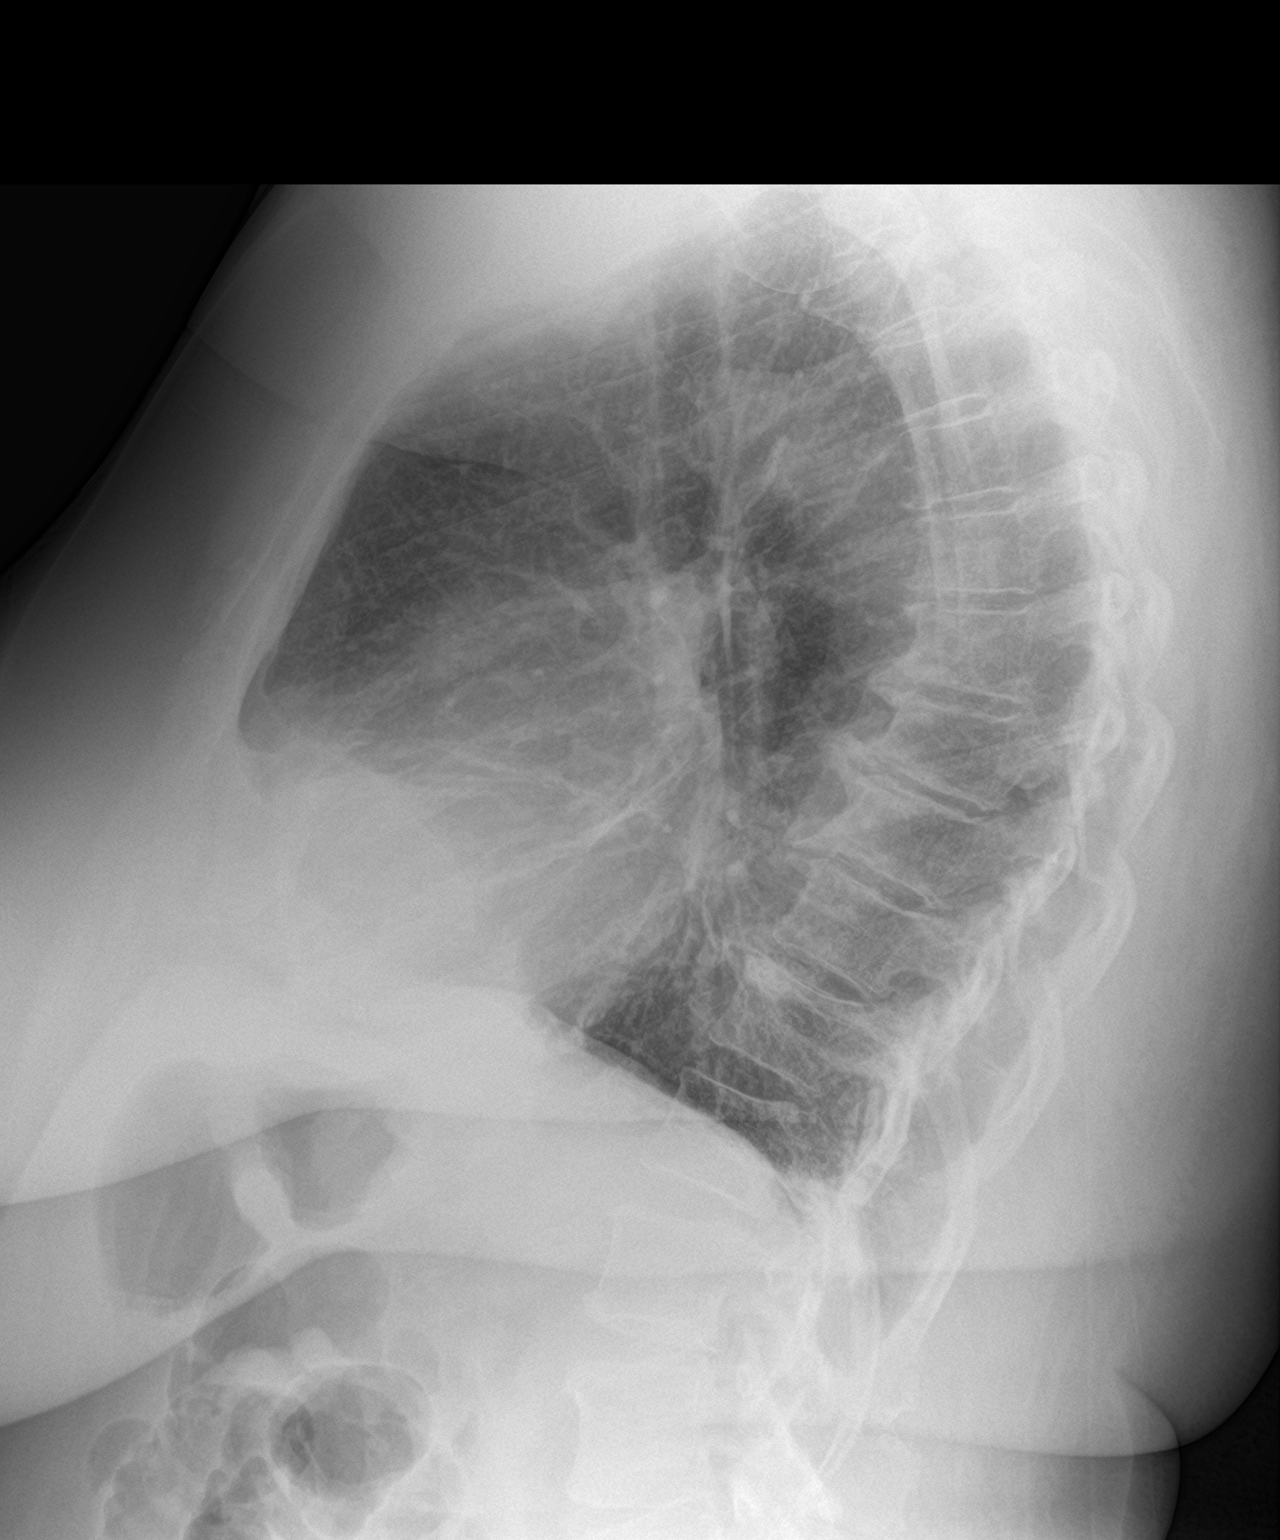

[2 of 2 positions shown; findings below may reference images not displayed]

FINDINGS: The heart is borderline enlarged. Normal vascularity. Bibasilar
subsegmental atelectasis. No pneumothorax or pleural effusion. No
definite consolidation. Spondylitic changes in the thoracic spine
are noted.
IMPRESSION: Borderline cardiomegaly without decompensation. Bibasilar
atelectasis.

## 2018-07-06 ENCOUNTER — Other Ambulatory Visit: Payer: Self-pay | Admitting: Pulmonary Disease

## 2018-07-06 MED FILL — MONTELUKAST SOD 10 MG TAB: 10 | 30 days supply | Qty: 30 | Fill #0

## 2018-07-06 MED FILL — POTASSIUM CHLORIDE CRYS ER: 10 | 30 days supply | Qty: 60 | Fill #0

## 2018-07-06 MED FILL — DOXEPIN 10 MG CAPSULE: 10 | 30 days supply | Qty: 30 | Fill #0

## 2018-07-08 MED FILL — ACCU-CHEK GUIDE STRP: 90 days supply | Qty: 200 | Fill #0

## 2018-07-12 DIAGNOSIS — E1165 Type 2 diabetes mellitus with hyperglycemia: Secondary | ICD-10-CM | POA: Diagnosis not present

## 2018-07-12 DIAGNOSIS — E039 Hypothyroidism, unspecified: Secondary | ICD-10-CM | POA: Diagnosis not present

## 2018-07-12 DIAGNOSIS — E7800 Pure hypercholesterolemia, unspecified: Secondary | ICD-10-CM | POA: Diagnosis not present

## 2018-07-12 MED FILL — LEVOTHYROXINE 88 MCG TABLET: 88 | 90 days supply | Qty: 90 | Fill #0

## 2018-07-13 DIAGNOSIS — E785 Hyperlipidemia, unspecified: Secondary | ICD-10-CM | POA: Diagnosis not present

## 2018-07-13 DIAGNOSIS — Z0000 Encounter for general adult medical examination without abnormal findings: Secondary | ICD-10-CM | POA: Diagnosis not present

## 2018-07-13 DIAGNOSIS — Z79899 Other long term (current) drug therapy: Secondary | ICD-10-CM | POA: Diagnosis not present

## 2018-07-13 DIAGNOSIS — E039 Hypothyroidism, unspecified: Secondary | ICD-10-CM | POA: Diagnosis not present

## 2018-07-13 DIAGNOSIS — E118 Type 2 diabetes mellitus with unspecified complications: Secondary | ICD-10-CM | POA: Diagnosis not present

## 2018-07-13 DIAGNOSIS — I10 Essential (primary) hypertension: Secondary | ICD-10-CM | POA: Diagnosis not present

## 2018-07-18 DIAGNOSIS — Z0001 Encounter for general adult medical examination with abnormal findings: Secondary | ICD-10-CM | POA: Diagnosis not present

## 2018-07-18 DIAGNOSIS — I10 Essential (primary) hypertension: Secondary | ICD-10-CM | POA: Diagnosis not present

## 2018-07-18 DIAGNOSIS — E118 Type 2 diabetes mellitus with unspecified complications: Secondary | ICD-10-CM | POA: Diagnosis not present

## 2018-07-18 DIAGNOSIS — E668 Other obesity: Secondary | ICD-10-CM | POA: Diagnosis not present

## 2018-07-18 DIAGNOSIS — Z6834 Body mass index (BMI) 34.0-34.9, adult: Secondary | ICD-10-CM | POA: Diagnosis not present

## 2018-07-18 DIAGNOSIS — E039 Hypothyroidism, unspecified: Secondary | ICD-10-CM | POA: Diagnosis not present

## 2018-07-18 DIAGNOSIS — G4700 Insomnia, unspecified: Secondary | ICD-10-CM | POA: Diagnosis not present

## 2018-07-18 DIAGNOSIS — I2510 Atherosclerotic heart disease of native coronary artery without angina pectoris: Secondary | ICD-10-CM | POA: Diagnosis not present

## 2018-07-18 DIAGNOSIS — E785 Hyperlipidemia, unspecified: Secondary | ICD-10-CM | POA: Diagnosis not present

## 2018-07-19 DIAGNOSIS — E1165 Type 2 diabetes mellitus with hyperglycemia: Secondary | ICD-10-CM | POA: Diagnosis not present

## 2018-07-19 DIAGNOSIS — I10 Essential (primary) hypertension: Secondary | ICD-10-CM | POA: Diagnosis not present

## 2018-07-19 DIAGNOSIS — E039 Hypothyroidism, unspecified: Secondary | ICD-10-CM | POA: Diagnosis not present

## 2018-07-19 DIAGNOSIS — E7800 Pure hypercholesterolemia, unspecified: Secondary | ICD-10-CM | POA: Diagnosis not present

## 2018-07-19 MED FILL — hydrOXYzine HCL 25 MG TABS: 25 | 30 days supply | Qty: 30 | Fill #0

## 2018-07-19 MED FILL — GLIMEPIRIDE 1 MG TABLET: 1 | 30 days supply | Qty: 90 | Fill #0

## 2018-07-24 DIAGNOSIS — Z1212 Encounter for screening for malignant neoplasm of rectum: Secondary | ICD-10-CM | POA: Diagnosis not present

## 2018-07-24 DIAGNOSIS — Z1211 Encounter for screening for malignant neoplasm of colon: Secondary | ICD-10-CM | POA: Diagnosis not present

## 2018-08-05 MED FILL — DOXEPIN 10 MG CAPSULE: 10 | 30 days supply | Qty: 30 | Fill #1

## 2018-08-05 MED FILL — MONTELUKAST SOD 10 MG TAB: 10 | 30 days supply | Qty: 30 | Fill #1

## 2018-08-18 MED FILL — IRBESARTAN-HCTZ 150-12.5 MG: 150-12.5 | 30 days supply | Qty: 30 | Fill #0

## 2018-08-23 MED FILL — CLOPIDOGREL 75 MG TABLET: 75 | 90 days supply | Qty: 90 | Fill #0

## 2018-08-25 MED FILL — hydrOXYzine HCL 25 MG TABS: 25 | 30 days supply | Qty: 30 | Fill #0

## 2018-08-30 ENCOUNTER — Other Ambulatory Visit: Payer: Self-pay | Admitting: Cardiovascular Disease

## 2018-08-30 MED FILL — GLIMEPIRIDE 1 MG TABLET: 1 | 30 days supply | Qty: 90 | Fill #1

## 2018-08-30 MED FILL — EZETIMIBE 10 MG TABS: 10 | 30 days supply | Qty: 30 | Fill #0

## 2018-08-31 ENCOUNTER — Ambulatory Visit: Payer: 59 | Admitting: Pulmonary Disease

## 2018-09-02 ENCOUNTER — Encounter: Payer: Self-pay | Admitting: Pulmonary Disease

## 2018-09-02 ENCOUNTER — Other Ambulatory Visit: Payer: Self-pay

## 2018-09-02 ENCOUNTER — Ambulatory Visit: Payer: 59 | Admitting: Pulmonary Disease

## 2018-09-02 VITALS — BP 128/88 | HR 77 | Temp 98.7°F | Ht 65.0 in | Wt 207.4 lb

## 2018-09-02 DIAGNOSIS — J432 Centrilobular emphysema: Secondary | ICD-10-CM

## 2018-09-02 DIAGNOSIS — J449 Chronic obstructive pulmonary disease, unspecified: Secondary | ICD-10-CM

## 2018-09-02 MED ORDER — BREO ELLIPTA 100-25 MCG/INH IN AEPB: 1.0000 | INHALATION_SPRAY | Freq: Every day | RESPIRATORY_TRACT | 5 refills | Status: DC

## 2018-09-02 MED ORDER — BREO ELLIPTA 100-25 MCG/INH IN AEPB: 1.0000 | INHALATION_SPRAY | Freq: Every day | RESPIRATORY_TRACT | 0 refills | Status: DC

## 2018-09-02 MED ORDER — BUDESONIDE-FORMOTEROL FUMARATE 160-4.5 MCG/ACT IN AERO: 2.0000 | INHALATION_SPRAY | Freq: Two times a day (BID) | RESPIRATORY_TRACT | 0 refills | Status: DC

## 2018-09-02 MED FILL — BREO ELLIPTA 100-25 MCG INH: 100-25 | 30 days supply | Qty: 60 | Fill #0

## 2018-09-02 NOTE — Patient Instructions (Signed)
Will change your inhaler to Breo one puff daily, and rinse mouth after each use  Call if Memory Dance is too expensive, and will change back to symbicort  Follow up in 1 year

## 2018-09-02 NOTE — Progress Notes (Signed)
[facility]  Chief Complaint  Patient presents with  . Follow-up    F/U re: COPD. She reports her breathing has been a little worse than her basline. Using albuterol and symbicort. Reports she rarely has to use the albuterol.     Constitutional:  BP 128/88   Pulse 77   Temp 98.7 F (37.1 C) (Temporal)   Ht 5\' 5"  (1.651 m)   Wt 207 lb 6.4 oz (94.1 kg)   LMP 01/06/2003 (Approximate)   SpO2 98%   BMI 34.51 kg/m   Past Medical History:  Hypothyroidism, Shingles, CAD s/p stent, HTN, DM, HLD  Brief Summary:  Shelley [name] is a 63 y.o. 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  Had hip surgery in December.  Feels much better.  Gets winded if she exerts too much.  Does okay at steady pace.  No issues with breathing while asleep.  Has occasional cough and wheeze with clear sputum.  Not having fever, hemoptysis, chest pain, or leg swelling.  Not needing to use albuterol much.  Concerned about expense of symbicort.  Physical Exam:   Appearance - well kempt   ENMT - clear nasal mucosa, midline nasal  septum, no oral exudates, no LAN, trachea midline  Respiratory - normal chest wall, normal respiratory effort, no accessory muscle use, no wheeze/rales  CV - s1s2 regular rate and rhythm, no murmurs, no peripheral edema, radial pulses symmetric  GI - soft, non tender, no masses  Lymph - no adenopathy noted in neck and axillary areas  MSK - normal gait  Ext - no cyanosis, clubbing, or joint inflammation noted  Skin - no rashes, lesions, or ulcers  Neuro - normal strength, oriented x 3  Psych - normal mood and affect  Assessment/Plan:   COPD with asthma and emphysema. - will try changing from symbicort to breo to determine if this is more affordable - continue singulair - prn albuterol - she will get flu shot at work   Patient Instructions  Will change your inhaler to [facility] one puff daily, and rinse mouth  after each use  Call if Memory Dance is too expensive, and will change back to symbicort  Follow up in 1 year    Chesley Mires, MD [place] Pager: [phone] 09/02/2018, 10:49 AM  Flow Sheet     Pulmonary tests:  FeNO 06/02/17 >> 59 Spirometry 06/02/17 >> FEV1 1.18 (55%), FEV1% 56 A1AT 08/23/17 >> 129, EM PFT 08/23/17 >> FEV1 1.31 (62%), FEV1% 59, TLC 5.05 (98%), DLCO 43%, no BD  Sleep tests:  CT angio chest 08/21/15 >> centrilobular emphysema, 5 mm nodule LLL stable since 2014  Cardiac tests:  Echo 08/26/15 >> EF 60 to 65%, grade 1 DD  Medications:   Allergies as of 09/02/2018      Reactions   Coreg [carvedilol] Shortness Of Breath   Levothyroxine Itching   Patient thinks it was the dye in the tablets she was allergic to, she feels like "something is crawling under my skin"   Lopressor [metoprolol Tartrate] Cough   Fatigue   Metformin And Related Diarrhea   Norvasc [amlodipine Besylate] Palpitations   "made my heart race"      Medication List       Accurate as of September 02, 2018 10:49 AM. If you have any questions, ask your nurse or doctor.        STOP taking these medications   azithromycin 250 MG tablet Commonly  known as: Zithromax Stopped by: Chesley Mires, MD   budesonide-formoterol 160-4.5 MCG/ACT inhaler Commonly known as: Symbicort Stopped by: Chesley Mires, MD   HYDROcodone-acetaminophen 7.5-325 MG tablet Commonly known as: Norco Stopped by: Chesley Mires, MD     TAKE these medications   Accu-Chek FastClix Lancets Misc   Accu-Chek Guide test strip Generic drug: glucose blood   albuterol 108 (90 Base) MCG/ACT inhaler Commonly known as: Ventolin HFA Inhale 2 puffs into the lungs every 6 (six) hours as needed for wheezing or shortness of breath.   Aspirin Adult Low Strength 81 MG EC tablet Generic drug: aspirin TAKE 1 TABLET BY MOUTH ONCE DAILY   bisoprolol 5 MG tablet Commonly known as: ZEBETA Take 0.5 tablets (2.5 mg total) by  mouth daily.   Breo Ellipta 100-25 MCG/INH Aepb Generic drug: fluticasone furoate-vilanterol Inhale 1 puff into the lungs daily. Started by: Chesley Mires, MD   clopidogrel 75 MG tablet Commonly known as: PLAVIX Take 1 tablet (75 mg total) by mouth daily.   diltiazem 120 MG tablet Commonly known as: CARDIZEM Take 1 tablet (120 mg total) by mouth 2 (two) times daily.   doxepin 10 MG capsule Commonly known as: SINEQUAN Take 10 mg by mouth at bedtime as needed (sleep).   ezetimibe 10 MG tablet Commonly known as: ZETIA TAKE 1 TABLET BY MOUTH DAILY.   fexofenadine 180 MG tablet Commonly known as: ALLEGRA Take 180 mg by mouth daily.   glimepiride 2 MG tablet Commonly known as: AMARYL Take 2 mg by mouth daily with breakfast.   hydrOXYzine 25 MG tablet Commonly known as: ATARAX/VISTARIL Take 25 mg by mouth 3 (three) times daily as needed (allergies).   irbesartan-hydrochlorothiazide 150-12.5 MG tablet Commonly known as: AVALIDE Take 1 tablet by mouth daily.   levothyroxine 88 MCG tablet Commonly known as: SYNTHROID Take 88 mcg by mouth daily before breakfast.   methocarbamol 500 MG tablet Commonly known as: Robaxin Take 1 tablet (500 mg total) by mouth every 6 (six) hours as needed for muscle spasms.   montelukast 10 MG tablet Commonly known as: SINGULAIR TAKE 1 TABLET (10 MG TOTAL) BY MOUTH AT BEDTIME.   potassium chloride 10 MEQ tablet Commonly known as: K-DUR TAKE 2 TABLETS (20 MEQ TOTAL) BY MOUTH DAILY.   rosuvastatin 40 MG tablet Commonly known as: CRESTOR Take 40 mg by mouth daily.       Past Surgical History:  She  has a past surgical history that includes Back surgery (5/99 & 6/99); Coronary angioplasty with stent (07/17/1998); Cardiac catheterization (01/12/2002); Coronary angioplasty with stent (01/07/2006); Coronary angioplasty with stent (03/18/2006); Cardiac catheterization (09/03/2006); Thyroid surgery; Tubal ligation; Cryotherapy; Cardiac catheterization  (N/A, 07/31/2015); Cardiac catheterization (N/A, 07/31/2015); Cardiac catheterization (N/A, 08/02/2015); and Total hip arthroplasty (Right, 12/21/2017).  Family History:  Her family history includes Esophageal cancer in her maternal aunt; Hypertension in her mother; Prostate cancer in her maternal uncle; Thyroid disease in her mother.  Social History:  She  reports that she quit smoking about 12 years ago. Her smoking use included cigarettes. She has a 17.00 pack-year smoking history. She quit smokeless tobacco use about 13 years ago. She reports that she does not drink alcohol or use drugs.

## 2018-09-02 NOTE — Addendum Note (Signed)
Addended by: Vivia Ewing on: 09/02/2018 10:54 AM   Modules accepted: Orders

## 2018-09-05 MED FILL — DOXEPIN 10 MG CAPSULE: 10 | 30 days supply | Qty: 30 | Fill #2

## 2018-09-05 MED FILL — MONTELUKAST SOD 10 MG TAB: 10 | 30 days supply | Qty: 30 | Fill #2

## 2018-09-05 MED FILL — POTASSIUM CHLORIDE CRYS ER: 10 | 30 days supply | Qty: 60 | Fill #1

## 2018-09-16 MED FILL — IRBESARTAN-HCTZ 150-12.5 MG: 150-12.5 | 30 days supply | Qty: 30 | Fill #1

## 2018-09-16 MED FILL — ROSUVASTATIN CALCIUM 40 MG: 40 | 90 days supply | Qty: 90 | Fill #0

## 2018-09-23 MED FILL — hydrOXYzine HCL 25 MG TABS: 25 | 30 days supply | Qty: 30 | Fill #1

## 2018-09-23 MED FILL — GLIMEPIRIDE 1 MG TABLET: 1 | 30 days supply | Qty: 90 | Fill #2

## 2018-09-29 MED FILL — METHOCARBAMOL 500 MG TABS: 500 | 10 days supply | Qty: 40 | Fill #1

## 2018-09-29 MED FILL — DICLOFENAC SODIUM 75 MG TAB: 75 | 30 days supply | Qty: 60 | Fill #2

## 2018-09-29 MED FILL — EZETIMIBE 10 MG TABS: 10 | 30 days supply | Qty: 30 | Fill #1

## 2018-09-29 MED FILL — BISOPROLOL FUMARATE 5 MG TA: 5 | 90 days supply | Qty: 45 | Fill #1

## 2018-10-05 DIAGNOSIS — M25511 Pain in right shoulder: Secondary | ICD-10-CM | POA: Diagnosis not present

## 2018-10-05 MED FILL — predniSONE 5 MG TABS: 5 | 12 days supply | Qty: 21 | Fill #0

## 2018-10-07 ENCOUNTER — Other Ambulatory Visit: Payer: Self-pay | Admitting: Pulmonary Disease

## 2018-10-07 MED FILL — LEVOTHYROXINE 88 MCG TABLET: 88 | 90 days supply | Qty: 90 | Fill #1

## 2018-10-14 ENCOUNTER — Telehealth: Payer: Self-pay | Admitting: Pulmonary Disease

## 2018-10-14 MED ORDER — MONTELUKAST SODIUM 10 MG PO TABS: 10.0000 mg | ORAL_TABLET | Freq: Every day | ORAL | 3 refills | Status: DC

## 2018-10-14 MED FILL — MONTELUKAST SOD 10 MG TAB: 10 | 90 days supply | Qty: 90 | Fill #0

## 2018-10-14 NOTE — Telephone Encounter (Signed)
LMTCB  Refill has been sent.

## 2018-10-17 NOTE — Telephone Encounter (Signed)
Attempted to call patient, no answer, left message for patient to call back.  

## 2018-10-18 NOTE — Telephone Encounter (Signed)
LMtCB for pt. This is the third attempt to reach pt. I left a detailed message to call us back if she needs anything further. Rx has already been sent to pharmacy. Will sign off.

## 2018-10-25 MED FILL — IRBESARTAN-HCTZ 150-12.5 MG: 150-12.5 | 30 days supply | Qty: 30 | Fill #2

## 2018-10-25 MED FILL — GLIMEPIRIDE 1 MG TABLET: 1 | 30 days supply | Qty: 90 | Fill #3

## 2018-11-01 ENCOUNTER — Other Ambulatory Visit: Payer: Self-pay

## 2018-11-01 ENCOUNTER — Other Ambulatory Visit: Payer: Self-pay | Admitting: Cardiovascular Disease

## 2018-11-01 MED FILL — EZETIMIBE 10 MG TABS: 10 | 30 days supply | Qty: 30 | Fill #0

## 2018-11-01 MED FILL — hydrOXYzine HCL 25 MG TABS: 25 | 30 days supply | Qty: 30 | Fill #2

## 2018-11-01 MED FILL — DICLOFENAC SODIUM 75 MG TAB: 75 | 30 days supply | Qty: 60 | Fill #3

## 2018-11-02 ENCOUNTER — Other Ambulatory Visit: Payer: Self-pay

## 2018-11-02 MED ORDER — DILTIAZEM HCL 120 MG PO TABS: 120.0000 mg | ORAL_TABLET | Freq: Two times a day (BID) | ORAL | 3 refills | Status: DC

## 2018-11-02 MED FILL — dilTIAZem HCL 120 MG TABS: 120 | 90 days supply | Qty: 180 | Fill #0

## 2018-11-03 ENCOUNTER — Telehealth: Payer: Self-pay | Admitting: *Deleted

## 2018-11-03 NOTE — Telephone Encounter (Signed)
Left a message for the patient to call back to confirm her virtual appointment tomorrow with Dr. Sallyanne Kuster and to check if she could have a current weight, blood pressure and heart rate available.

## 2018-11-04 ENCOUNTER — Telehealth: Payer: Self-pay | Admitting: *Deleted

## 2018-11-04 ENCOUNTER — Telehealth (INDEPENDENT_AMBULATORY_CARE_PROVIDER_SITE_OTHER): Payer: 59 | Admitting: Cardiovascular Disease

## 2018-11-04 VITALS — BP 121/82 | HR 79 | Ht 65.0 in | Wt 205.0 lb

## 2018-11-04 DIAGNOSIS — E669 Obesity, unspecified: Secondary | ICD-10-CM | POA: Diagnosis not present

## 2018-11-04 DIAGNOSIS — E1169 Type 2 diabetes mellitus with other specified complication: Secondary | ICD-10-CM

## 2018-11-04 DIAGNOSIS — I10 Essential (primary) hypertension: Secondary | ICD-10-CM

## 2018-11-04 DIAGNOSIS — I2510 Atherosclerotic heart disease of native coronary artery without angina pectoris: Secondary | ICD-10-CM

## 2018-11-04 DIAGNOSIS — E7800 Pure hypercholesterolemia, unspecified: Secondary | ICD-10-CM | POA: Diagnosis not present

## 2018-11-04 MED ORDER — DILTIAZEM HCL 60 MG PO TABS: 60.0000 mg | ORAL_TABLET | Freq: Two times a day (BID) | ORAL | Status: DC

## 2018-11-04 NOTE — Progress Notes (Signed)
Virtual Visit via Telephone Note   This visit type was conducted due to national recommendations for restrictions regarding the COVID-19 Pandemic (e.g. social distancing) in an effort to limit this patient's exposure and mitigate transmission in our community.  Due to her co-morbid illnesses, this patient is at least at moderate risk for complications without adequate follow up.  This format is felt to be most appropriate for this patient at this time.  The patient did not have access to video technology/had technical difficulties with video requiring transitioning to audio format only (telephone).  All issues noted in this document were discussed and addressed.  No physical exam could be performed with this format.  Please refer to the patient's chart for her  consent to telehealth for [facility].   Date:  11/04/2018   ID:  Shelley [name], DOB 05-Apr-1955, MRN [id number]  Patient Location: Home Provider Location: Office  PCP:  [name], MD  Cardiologist:  Anani Gu Electrophysiologist:  None   Evaluation Performed:  Follow-Up Visit  Chief Complaint:  CAD  History of Present Illness:    Shelley [name] is a 63 y.o. female with premature onset coronary artery disease, diabetes mellitus, hypertension and obesity.  She feels well. She misses being able to bowl or go shopping. The patient specifically denies any chest pain at rest exertion, dyspnea at rest or with exertion, orthopnea, paroxysmal nocturnal dyspnea, syncope, palpitations, focal neurological deficits, intermittent claudication, lower extremity edema, unexplained weight gain, cough, hemoptysis or wheezing.  Her last revascularization was in July 2017 for a non-STEMI when she received a drug-eluting stent to the RCA.   She had pruritus that she associated with taking long-acting diltiazem resolved once we switched to immediate release diltiazem. She tried [place] but developed yeast infections.  The patient  does not have symptoms concerning for COVID-19 infection (fever, chills, cough, or new shortness of breath).   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 She has a rich history of coronary disease. In 2001, when she was only 63 years old she received a bare-metal stent to the mid LAD artery. In 2008 she presented with an acute myocardial infarction and received a 3 x 24 mm drug-eluting Taxus stent to the LAD artery. Subsequently underwent staged revascularization with a drug-eluting Cypher 2.5 x 13 mm to the distal LAD artery and a 3.0 x 23 mm drug-eluting Cypher stent to the right coronary artery. July 31 2014 received drug-eluting stent to the distal RCA PROMUS PREM MR 3.0X16 for STEMI. She has normal left ventricular systolic function. She has never had symptoms of congestive heart failure.    Past Medical History:  Diagnosis Date  . CAD (coronary artery disease)   . Cardiac arrhythmia   . DES exposure in utero   . Diabetes mellitus   . Dyslipidemia   . Hypertension   . MI (myocardial infarction) ([place])   . Shingles   . Thyroid disease    Past Surgical History:  Procedure Laterality Date  . BACK SURGERY  5/99 & 6/99  . CARDIAC CATHETERIZATION  01/12/2002   stent patent  . CARDIAC CATHETERIZATION  09/03/2006   patent stents  . CARDIAC CATHETERIZATION N/A 07/31/2015   Procedure: Left Heart Cath and Coronary Angiography;  Surgeon: Burnell Blanks, MD;  Location: [place] [facility];  Service: Cardiovascular;  Laterality: N/A;  . CARDIAC CATHETERIZATION N/A 07/31/2015   Procedure: Coronary Stent Intervention;  Surgeon: Burnell Blanks, MD;  Location: [place] [facility];  Service: Cardiovascular;  Laterality: N/A;  . CARDIAC CATHETERIZATION N/A 08/02/2015   Procedure: Coronary Stent Intervention;  Surgeon: Leonie Man, MD;  Location: [place] [facility];  Service: Cardiovascular;  Laterality: N/A;  . CORONARY ANGIOPLASTY WITH STENT PLACEMENT  07/17/1998   stent LAD  .  CORONARY ANGIOPLASTY WITH STENT PLACEMENT  01/07/2006   stent RCA,LAD stent patent  . CORONARY ANGIOPLASTY WITH STENT PLACEMENT  03/18/2006   stent to mid RCA,prox.RCA and LAD stent patent  . CRYOTHERAPY    . THYROID SURGERY     PT. DENIES/   just radiation RADIATION  . TOTAL HIP ARTHROPLASTY Right 12/21/2017   Procedure: TOTAL HIP ARTHROPLASTY ANTERIOR APPROACH;  Surgeon: Paralee Cancel, MD;  Location: [facility];  Service: Orthopedics;  Laterality: Right;  70 mins  . TUBAL LIGATION       Current Meds  Medication Sig  . albuterol (VENTOLIN HFA) 108 (90 Base) MCG/ACT inhaler Inhale 2 puffs into the lungs every 6 (six) hours as needed for wheezing or shortness of breath.  . ASPIRIN ADULT LOW STRENGTH 81 MG EC tablet TAKE 1 TABLET BY MOUTH ONCE DAILY  . bisoprolol (ZEBETA) 5 MG tablet Take 0.5 tablets (2.5 mg total) by mouth daily.  . clopidogrel (PLAVIX) 75 MG tablet Take 1 tablet (75 mg total) by mouth daily.  Marland Kitchen diltiazem (CARDIZEM) 120 MG tablet Take 1 tablet (120 mg total) by mouth 2 (two) times daily. (Patient taking differently: Take 120 mg by mouth daily. )  . doxepin (SINEQUAN) 10 MG capsule Take 10 mg by mouth at bedtime as needed (sleep).  . ezetimibe (ZETIA) 10 MG tablet TAKE 1 TABLET BY MOUTH DAILY.  . fexofenadine (ALLEGRA) 180 MG tablet Take 180 mg by mouth daily.   . fluticasone furoate-vilanterol (BREO ELLIPTA) 100-25 MCG/INH AEPB Inhale 1 puff into the lungs daily.  Marland Kitchen glimepiride (AMARYL) 2 MG tablet Take 2 mg by mouth daily with breakfast.  . hydrOXYzine (ATARAX/VISTARIL) 25 MG tablet Take 25 mg by mouth 3 (three) times daily as needed (allergies).  . irbesartan-hydrochlorothiazide (AVALIDE) 150-12.5 MG tablet Take 1 tablet by mouth daily.  Marland Kitchen levothyroxine (SYNTHROID, LEVOTHROID) 88 MCG tablet Take 88 mcg by mouth daily before breakfast.   . montelukast (SINGULAIR) 10 MG tablet Take 1 tablet (10 mg total) by mouth at bedtime.  . potassium chloride (K-DUR,KLOR-CON) 10 MEQ tablet  TAKE 2 TABLETS (20 MEQ TOTAL) BY MOUTH DAILY. (Patient taking differently: Take by mouth daily. )  . rosuvastatin (CRESTOR) 40 MG tablet Take 40 mg by mouth daily.      Allergies:   Coreg [carvedilol], Levothyroxine, Lopressor [metoprolol tartrate], Metformin and related, and Norvasc [amlodipine besylate]   Social History   Tobacco Use  . Smoking status: Former Smoker    Packs/day: 0.50    Years: 34.00    Pack years: 17.00    Types: Cigarettes    Quit date: 2008    Years since quitting: 12.8  . Smokeless tobacco: Former Systems developer    Quit date: 01/05/2005  Substance Use Topics  . Alcohol use: No    Alcohol/week: 0.0 standard drinks  . Drug use: No     Family Hx: The patient's family history includes Esophageal cancer in her maternal aunt; Hypertension in her mother; Prostate cancer in her maternal uncle; Thyroid disease in her mother.  ROS:   Please see the history of present illness.     All other systems reviewed and are negative.   Prior CV studies:  The following studies were reviewed today:    Labs/Other Tests and Data Reviewed:    EKG:  An ECG dated 11/01/2017 was personally reviewed today and demonstrated:  NSR, nonspecific T wave changes.  Recent Labs: 12/22/2017: BUN 17; Creatinine, Ser 0.84; Hemoglobin 12.6; Platelets 241; Potassium 3.7; Sodium 141   Recent Lipid Panel Lab Results  Component Value Date/Time   CHOL 209 (H) 08/03/2015 04:50 AM   TRIG 105 08/03/2015 04:50 AM   HDL 32 (L) 08/03/2015 04:50 AM   CHOLHDL 6.5 08/03/2015 04:50 AM   LDLCALC 156 (H) 08/03/2015 04:50 AM   07/12/2018 Chol 102, HDL 32, LDL 69, TG 88 Creatinine 1.06, K 3.7, Hgb 12.6, normal LFTs  Wt Readings from Last 3 Encounters:  11/04/18 205 lb (93 kg)  09/02/18 207 lb 6.4 oz (94.1 kg)  12/21/17 206 lb (93.4 kg)     Objective:    Vital Signs:  BP 121/82   Pulse 79   Ht 5\' 5"  (1.651 m)   Wt 205 lb (93 kg)   LMP 01/06/2003 (Approximate)   BMI 34.11 kg/m    VITAL SIGNS:   reviewed unable to examine  ASSESSMENT & PLAN:    1. CAD: Denies angina; asymptomatic at rest or with activity. Last revascularization procedure was in 2017. Has normal LV function. Plan lifelong dual antiplatelet therapy, barring serious bleeding complications. 2. HTN: well controlled. on immediate release diltiazem since she had pruritus with the sustained release formulation capsules. Needs to take the IR preparation at least twice daily. Feels better on calcium channel blockers than beta blockers. 3. HLP:  on statin, excellent LDL, needs to try to exercise and reduce weight to improve the HDL. 4. Obesity: Weight loss would have substantial beneficial effects for both diabetes control and hyperlipidemia. It would probably be the single most important intervention to increase her relatively low HDL cholesterol 5. DM: glycemic control is no longer as good as before. She had side effects with SGLT-2 inhibitors. Consider a GLP-1 agonist since she has cardiovascular disease. Will defer decision to Dr. Chalmers Cater, but this would be preferable to sulfonylurea from a cardiology point of view. She expressed interest in the Coordinate-DM trial. She is on ARB.  COVID-19 Education: The signs and symptoms of COVID-19 were discussed with the patient and how to seek care for testing (follow up with PCP or arrange E-visit).  The importance of social distancing was discussed today.  Time:   Today, I have spent 16 minutes with the patient with telehealth technology discussing the above problems.     Medication Adjustments/Labs and Tests Ordered: Current medicines are reviewed at length with the patient today.  Concerns regarding medicines are outlined above.   Tests Ordered: No orders of the defined types were placed in this encounter.   Medication Changes: No orders of the defined types were placed in this encounter.   Follow Up:  In Person 1 year  Signed, Sanda Klein, MD  11/04/2018 1:02 PM     [place]

## 2018-11-04 NOTE — Patient Instructions (Signed)

## 2018-11-04 NOTE — Telephone Encounter (Signed)
Patient has been called and advised that per Dr. Sallyanne Kuster she should take Diltiazem 60 mg bid. She can spilt her 120 mg tablet.  She has verbalized her understanding.

## 2018-11-07 ENCOUNTER — Encounter: Payer: Self-pay | Admitting: *Deleted

## 2018-11-07 DIAGNOSIS — Z006 Encounter for examination for normal comparison and control in clinical research program: Secondary | ICD-10-CM

## 2018-11-07 NOTE — Research (Signed)
COORDINATE-Diabetes BASELINE CASE REPORT FORM (Intervention) Site #:  [id number]              Patient ID:       [id number]           INCLUSION CRITERIA  1. Is patient 18 years or older? _0 No   _1 Yes  2. Based on the clinical record, does the patient have a history of type 2 diabetes mellitus? _2 No   _3 Yes  3. Based on the clinical record, does the patient have a history of atherosclerotic cardiovascular disease, as defined by at least one of the clinical criteria below? _4 No   _5 Yes   IF YES: Coronary Artery Disease Prior myocardial infarction Prior coronary artery bypass graft surgery Prior percutaneous coronary intervention Documented obstructive (i.e. ?50%) coronary artery disease (by angiography or CT) _6 No  _7 Yes _8 No  _9 Yes _10 No  _11 Yes _12 No  _13 Yes IF YES: date of MOST RECENT event: 07/28/2017mm dd yyyy   Cerebrovascular Disease Prior ischemic stroke Carotid artery stenosis (?50%) _14 No _15  Yes _16 No _17  Yes IF YES: date of MOST RECENT event:          / /          mm dd yyyy   Peripheral Arterial Disease Prior peripheral revascularization Prior amputation due to poor circulation History of Claudication with Ankle-brachial index <0.9 _18 No _19  Yes _20 No _21  Yes _22 No _23  Yes IF YES: date of MOST RECENT event:          / /           mm dd yyyy  4. Patient's baseline guideline-based therapy score:    1 point Currently prescribed angiotensin converting enzyme inhibitor (ACEi), angiotensin receptor blocker (ARB) or Angiotensin Receptor Neprilysin inhibitor (ARNi) therapy _24 No _25  Yes   1 point Currently prescribed high intensity statin therapy (atorvastatin 40-80mg daily OR rosuvastatin 20-40mg daily) _26 No _27  Yes   1 point Most recent HbA1c <7% on metformin monotherapy? _28 No _29  Yes    Calculated score:    EXCLUSION CRITERIA  REVIEW EACH CONDITION AND SELECT YES IF THE STATEMENT IS TRUE OR NO IF THE STATEMENT IS FALSE.  1. Determined to be highly unlikely to survive and/or to continue  follow-up in that clinic for at least 1 year, as identified by site investigator _30 No _31 Yes  2. eGFR ? 30 ml/min/1.26m _32 No _33 Yes  3. Baseline composite score of 3 for guideline recommended therapy _34 No _35 Yes  4. Absolute contraindication to any of the 3 guideline recommended therapies    ACEi AND ARB therapy _36 No _37  Yes    High intensity statin therapy _38 No  _39 Yes    SGLT2I AND GLP1RA therapy _40 No  _41 Yes   5. Already taking SGLT2i or GLP1RA therapy at baseline _42 No _43 Yes     Subject Name: Shelley [name] Subject met inclusion and exclusion criteria.  The informed consent form, study requirements and expectations were reviewed with the subject and questions and concerns were addressed prior to the signing of the consent form.  The subject verbalized understanding of the trial requirements.  The subject agreed to participate in the [place] Diabetes trial and signed the informed consent on 11/07/18 at 1045.The informed consent was obtained prior to performance of any protocol-specific procedures for the subject.  A copy of the signed informed consent was given to the subject and a copy was placed in the subject's medical record.   [facility]  MEDICAL HISTORY  Other Cardiovascular  Atrial Fibrillation or Atrial Flutter _44   No _0 Yes  Hypertension _1 No _2 Yes  Hyperlipidemia _3 No _4 Yes  Cigarette smoking _5 Current _6 Former _7 Never  Non-Cardiovascular  Anemia (Hgb<lower limit normal) _8 No _9 Yes  Asthma _10 No _11 Yes  Chronic lung disease _12 No _13 Yes  Cancer - leukemia, lymphoma, or localized solid tumor _14 No _15 Yes  Cancer - metastatic solid tumor _16 No _17 Yes  Mild Liver Disease (Cirrhosis without portal hypertension) _18 No _19 Yes  Moderate/Severe Liver Disease (Cirrhosis with portal hypertension) _20 No _21 Yes  Connective Tissue Disease _22 No _23 Yes  Dementia _24 No _25 Yes  Depression _26 No _27 Yes  Hemiplegia or paraplegia _28 No _29 Yes  Human immunodeficiency  virus _30 No _31 Yes  Obstructive Sleep Apnea _32 No _33 Yes  Peptic Ulcer Disease _34 No _35 Yes  Renal insufficiency _36 No _37 Yes  Drug or Alcohol Abuse _38 Current _39 Former _40 Never  Diabetes History  Year of diagnosis of type 2 diabetes  2014   History of diabetic ketoacidosis? _41 No _42 Yes   Are any of the following complications of diabetes present (Select all that apply): _43 Retinopathy _44 Neuropathy <[id number]>_5<\/[id number]>_70 Foot complications      (including ulcers, calluses,         amputation) _46 Gastroparesis _47 None of the above        _48 No _49 Yes  Subject educated on Coordinate diabetes and all educational materials given to subject.

## 2018-11-17 DIAGNOSIS — I10 Essential (primary) hypertension: Secondary | ICD-10-CM | POA: Diagnosis not present

## 2018-11-17 DIAGNOSIS — E7800 Pure hypercholesterolemia, unspecified: Secondary | ICD-10-CM | POA: Diagnosis not present

## 2018-11-17 DIAGNOSIS — E1165 Type 2 diabetes mellitus with hyperglycemia: Secondary | ICD-10-CM | POA: Diagnosis not present

## 2018-11-17 DIAGNOSIS — E039 Hypothyroidism, unspecified: Secondary | ICD-10-CM | POA: Diagnosis not present

## 2018-11-23 ENCOUNTER — Other Ambulatory Visit: Payer: Self-pay | Admitting: Cardiology

## 2018-11-24 MED FILL — IRBESARTAN-HCTZ 150-12.5 MG: 150-12.5 | 30 days supply | Qty: 30 | Fill #0

## 2018-11-24 MED FILL — CLOPIDOGREL 75 MG TABLET: 75 | 90 days supply | Qty: 90 | Fill #0

## 2018-11-25 ENCOUNTER — Other Ambulatory Visit: Payer: Self-pay | Admitting: *Deleted

## 2018-11-25 MED ORDER — POTASSIUM CHLORIDE CRYS ER 10 MEQ PO TBCR: 20.0000 meq | EXTENDED_RELEASE_TABLET | Freq: Every day | ORAL | 9 refills | Status: DC

## 2018-11-25 MED FILL — POTASSIUM CHLORIDE CRYS ER: 10 | 30 days supply | Qty: 60 | Fill #0

## 2018-11-25 NOTE — Telephone Encounter (Signed)
Rx has been sent to the pharmacy electronically. ° °

## 2018-12-12 ENCOUNTER — Other Ambulatory Visit: Payer: Self-pay | Admitting: Cardiology

## 2018-12-12 MED FILL — hydrOXYzine HCL 25 MG TABS: 25 | 30 days supply | Qty: 30 | Fill #3

## 2018-12-13 MED FILL — EZETIMIBE 10 MG TABS: 10 | 90 days supply | Qty: 90 | Fill #0

## 2018-12-21 DIAGNOSIS — Z96641 Presence of right artificial hip joint: Secondary | ICD-10-CM | POA: Diagnosis not present

## 2018-12-21 DIAGNOSIS — Z471 Aftercare following joint replacement surgery: Secondary | ICD-10-CM | POA: Diagnosis not present

## 2018-12-27 MED FILL — ROSUVASTATIN CALCIUM 40 MG: 40 | 90 days supply | Qty: 90 | Fill #1

## 2018-12-27 MED FILL — IRBESARTAN-HCTZ 150-12.5 MG: 150-12.5 | 30 days supply | Qty: 30 | Fill #1

## 2019-01-02 ENCOUNTER — Other Ambulatory Visit: Payer: Self-pay | Admitting: Cardiology

## 2019-01-02 MED FILL — BISOPROLOL FUMARATE 5 MG TA: 5 | 90 days supply | Qty: 45 | Fill #0

## 2019-01-02 MED FILL — LEVOTHYROXINE 88 MCG TABLET: 88 | 90 days supply | Qty: 90 | Fill #0

## 2019-01-09 MED FILL — hydrOXYzine HCL 25 MG TABS: 25 | 30 days supply | Qty: 30 | Fill #4

## 2019-01-09 MED FILL — GLIMEPIRIDE 1 MG TABLET: 1 | 30 days supply | Qty: 90 | Fill #4

## 2019-01-16 IMAGING — RF DG HIP (WITH PELVIS) OPERATIVE*R*
1 series · 2 of 2 positions shown · non-contrast
Comparison: RIGHT hip x-rays 02/03/2017.

CLINICAL DATA: ANTERIOR approach RIGHT total hip arthroplasty.

EXAM:
OPERATIVE RIGHT HIP (WITH PELVIS IF PERFORMED) 1 VIEW
TECHNIQUE: Fluoroscopic spot image(s) were submitted for interpretation
post-operatively.

[Series 1: unknown protocol · 0.20mm/px · 2 of 2 slices shown]
[im 1/2]
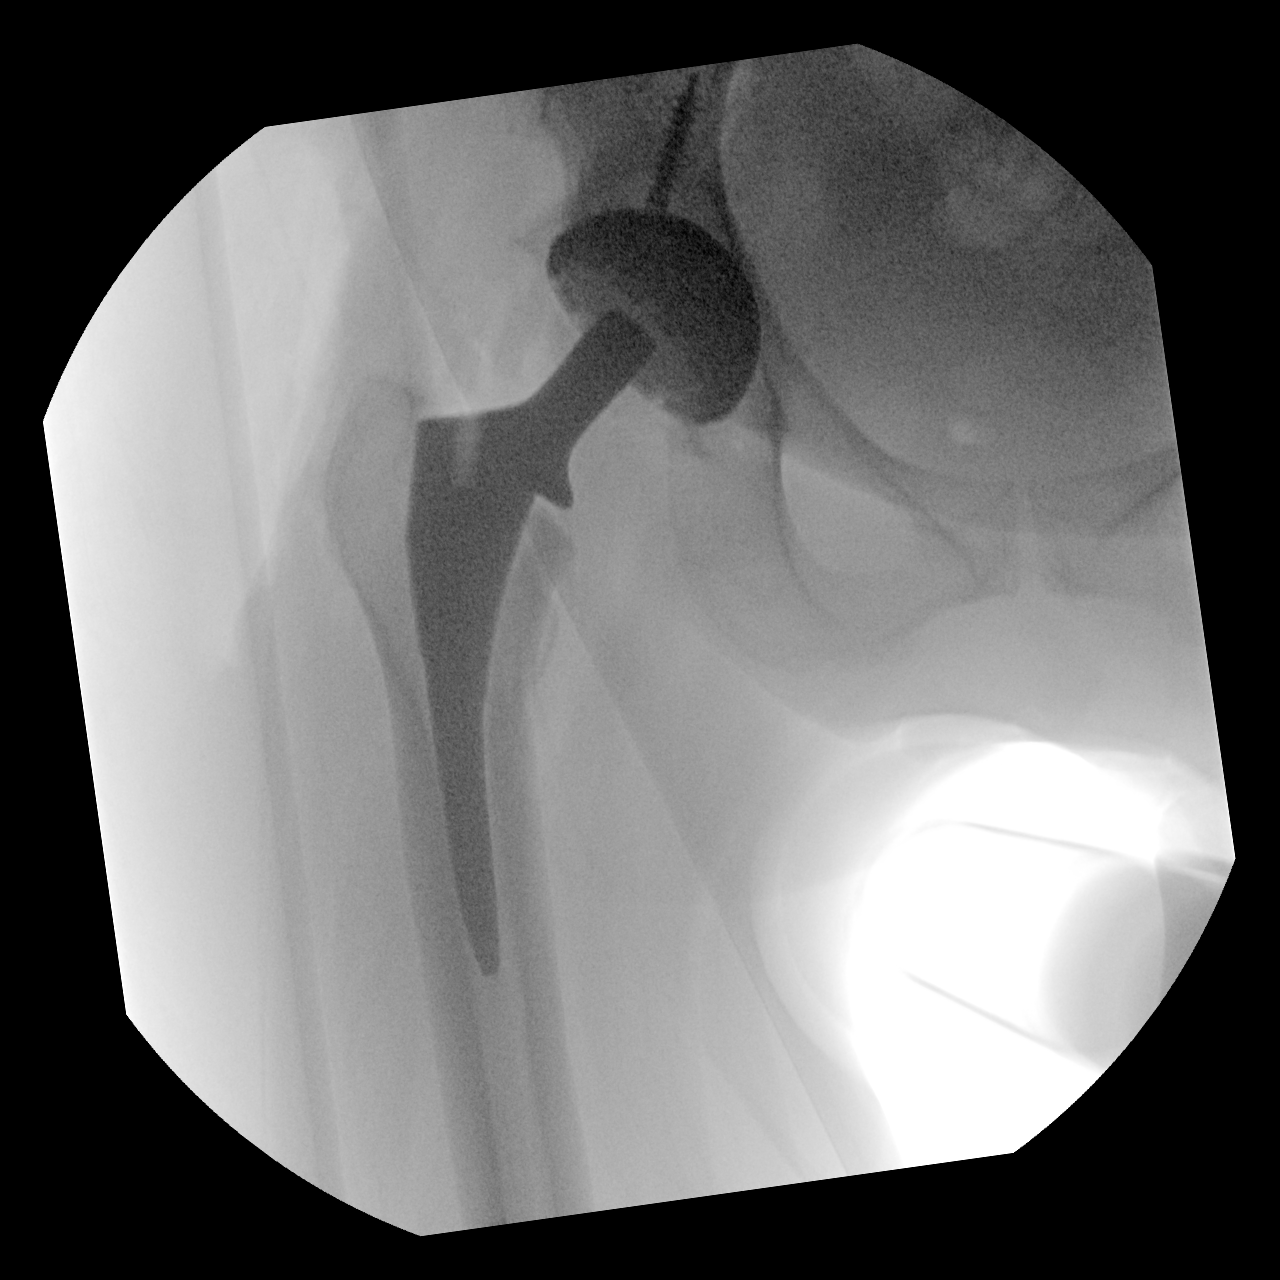
[im 2/2]
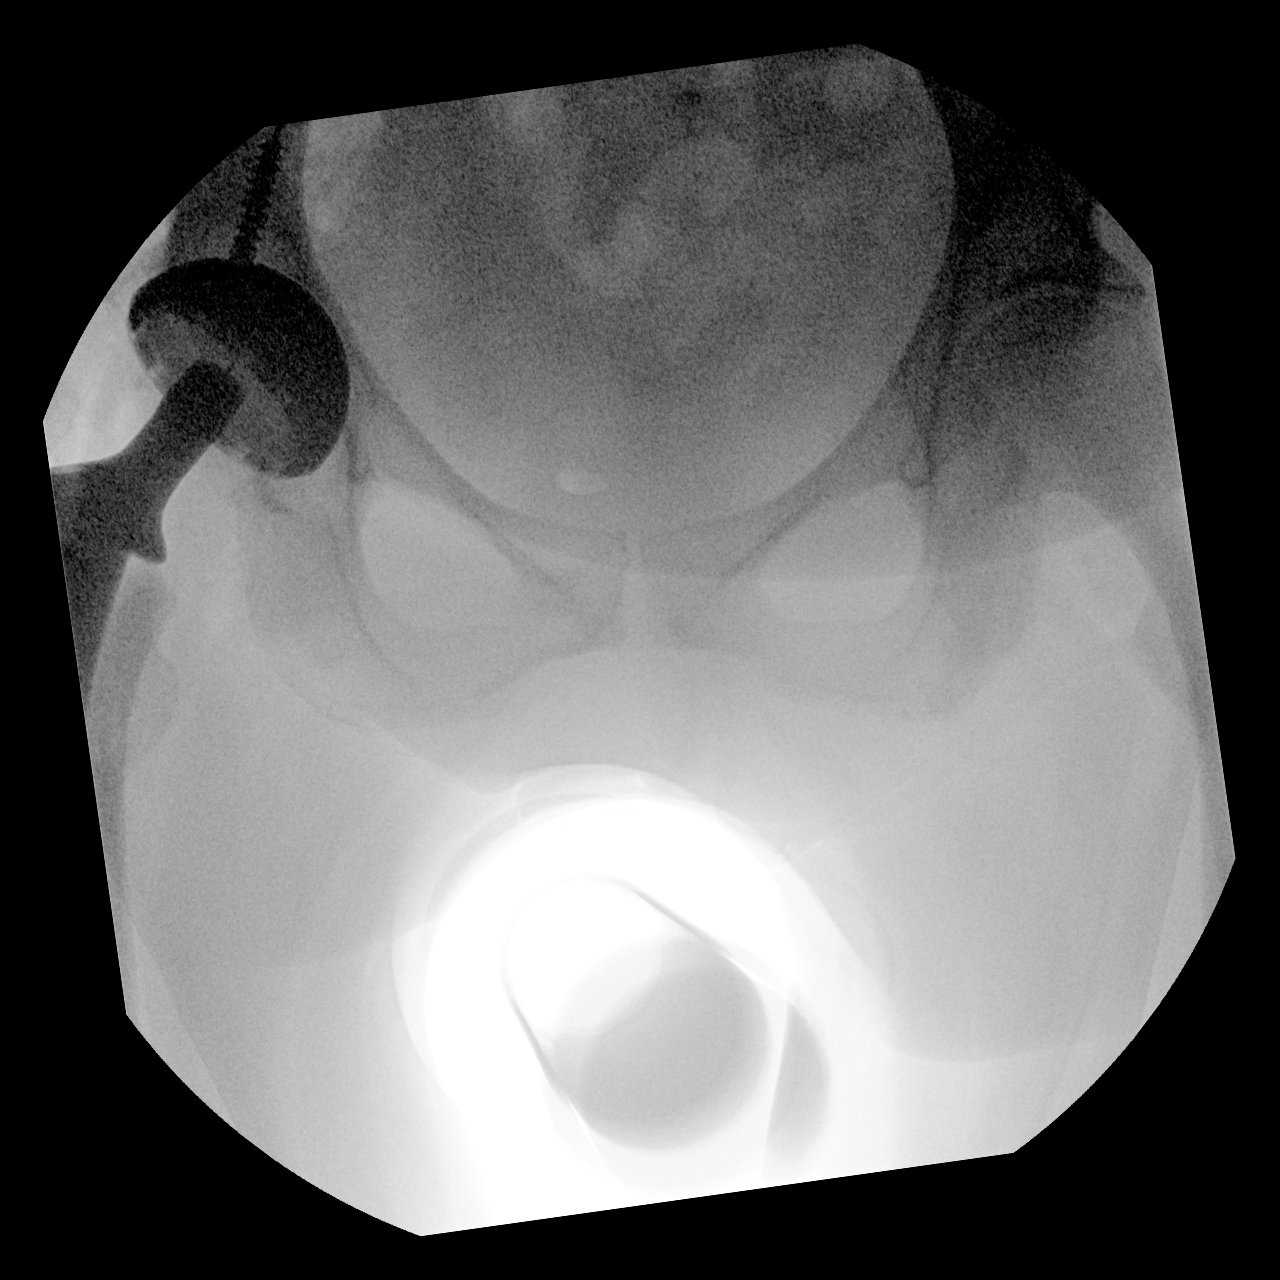

[2 of 2 positions shown; findings below may reference images not displayed]

FINDINGS: Anatomic alignment of the RIGHT hip prosthesis in the AP projection.
No visible complicating features.

The radiologic technologist documented 10 seconds of fluoroscopy
time period
IMPRESSION: Anatomic alignment of the RIGHT hip prosthesis in the AP projection.

## 2019-01-23 MED FILL — POTASSIUM CHLORIDE CRYS ER: 10 | 30 days supply | Qty: 60 | Fill #1

## 2019-01-23 MED FILL — FREESTYLE LITE TEST STRIP: 50 days supply | Qty: 100 | Fill #0

## 2019-01-23 MED FILL — IRBESARTAN-HCTZ 150-12.5 MG: 150-12.5 | 30 days supply | Qty: 30 | Fill #2

## 2019-01-23 MED FILL — FREESTYLE LITE METER: 30 days supply | Qty: 1 | Fill #0

## 2019-01-23 MED FILL — FREESTYLE LANCETS: 50 days supply | Qty: 100 | Fill #0

## 2019-01-23 MED FILL — MONTELUKAST SOD 10 MG TAB: 10 | 90 days supply | Qty: 90 | Fill #1

## 2019-02-10 MED FILL — GLIMEPIRIDE 1 MG TABLET: 1 | 30 days supply | Qty: 90 | Fill #5

## 2019-02-10 MED FILL — hydrOXYzine HCL 25 MG TABS: 25 | 30 days supply | Qty: 30 | Fill #5

## 2019-03-03 MED FILL — CLOPIDOGREL 75 MG TABLET: 75 | 90 days supply | Qty: 90 | Fill #1

## 2019-03-10 MED FILL — IRBESARTAN-HCTZ 150-12.5 MG: 150-12.5 | 30 days supply | Qty: 30 | Fill #3

## 2019-03-13 MED FILL — GLIMEPIRIDE 1 MG TABLET: 1 | 30 days supply | Qty: 90 | Fill #6

## 2019-03-16 MED FILL — hydrOXYzine HCL 25 MG TABS: 25 | 30 days supply | Qty: 30 | Fill #6

## 2019-03-27 MED FILL — ROSUVASTATIN CALCIUM 40 MG: 40 | 90 days supply | Qty: 90 | Fill #2

## 2019-03-27 MED FILL — EZETIMIBE 10 MG TABS: 10 | 90 days supply | Qty: 90 | Fill #1

## 2019-03-27 MED FILL — POTASSIUM CHLORIDE CRYS ER: 10 | 30 days supply | Qty: 60 | Fill #2

## 2019-04-05 ENCOUNTER — Other Ambulatory Visit: Payer: Self-pay | Admitting: Cardiology

## 2019-04-05 MED FILL — LEVOTHYROXINE 88 MCG TABLET: 88 | 90 days supply | Qty: 90 | Fill #1

## 2019-04-05 MED FILL — IRBESARTAN-HCTZ 150-12.5 MG: 150-12.5 | 30 days supply | Qty: 30 | Fill #4

## 2019-04-06 MED FILL — BISOPROLOL FUMARATE 5 MG TA: 5 | 90 days supply | Qty: 45 | Fill #0

## 2019-04-06 MED FILL — FREESTYLE LITE TEST STRIP: 50 days supply | Qty: 100 | Fill #1

## 2019-04-17 ENCOUNTER — Other Ambulatory Visit (HOSPITAL_COMMUNITY): Payer: Self-pay | Admitting: Endocrinology

## 2019-04-17 MED FILL — GLIMEPIRIDE 1 MG TABLET: 1 | 30 days supply | Qty: 90 | Fill #0

## 2019-04-17 MED FILL — hydrOXYzine HCL 25 MG TABS: 25 | 30 days supply | Qty: 30 | Fill #7

## 2019-04-28 ENCOUNTER — Telehealth: Payer: Self-pay | Admitting: *Deleted

## 2019-04-28 MED FILL — MONTELUKAST SOD 10 MG TAB: 10 | 90 days supply | Qty: 90 | Fill #2

## 2019-05-02 ENCOUNTER — Telehealth: Payer: Self-pay | Admitting: *Deleted

## 2019-05-02 NOTE — Telephone Encounter (Signed)
Marland KitchenMarland KitchenMarland KitchenMarland KitchenMarland KitchenMarland KitchenMarland Kitchen   COORDINATE-Diabetes 3 Month Patient Questionnaire (Intervention) Site #:   [id number]   Patient ID:      [id number]             3 MONTH QUESTIONNAIRE  Visit Date 05/02/2019   Vital Status [x]   Patient Alive > Proceed to Visit Status []   Patient Dead > Complete Death Form only []   Unknown > Proceed to Visit Status  Visit Status Was the interview completed? []  No >IF NO, Select reason why [] Unable to locate                                    [] No Valid Contacts (patients or alternates) [] Multiple attempts to valid contacts []  Patient no longer cared for at study clinic []  Patient withdrew []  Other, specify:                                           >IF NO, Last date of contact: / /             MM      DD        YYYY    [x] Yes >IF YES, Select source of Interview: []   Proxy [x]   Patient     3 Month Patient Questionnaire (Intervention)  Instructions:   1. Prior to speaking with the patient either over the phone or in person, print off or have available the patient's current list of medications.      IF conducting follow-up visit over the phone - Instruct the patient to gather all their pill bottles. 2. For the purpose of the COORDINATE-Diabetes Study, please document whether the patient is currently taking each of the following medication classes. 3. DO NOT PROMPT DURING FOLLOW-UP VISIT: IF subject mentions reaction to one of the following medications, complete the AE/SAE section and follow instructions in operations manual regarding Adverse event reporting to [employer] ([facility]).  MEDICATIONS  Medication Are you currently taking [insert name of previous med]? If currently taking: If started since last visit: If stopped since last visit:  ACE Inhibitor / Angiotensin Receptor Blocker (ARB) / Angiotensin Receptor Neprilysin inhibitor (ARNi) [] No >  [x] Yes > [] Stopped since last visit [] Never prescribed [] Started since last visit [x] Same medication as last      visit [] Different medication than       last visit [Study personnel to answer]  Documented in EHR? [] No  [x] Yes   If no, Verified by: [] Photo of bottle [] Copy of rx [] Dispensing       pharmacy [] Prescribing provider [] Other (specify):                    Date started:        / /             MM DD YYYY Who prescribed this medication for you? [] Cardiology provider > [] Study clinic [] Outside clinic [] Endocrinology provider [] Primary care provider [] Other provider > Specify:                             [] Unknown Date discontinued:        / /             MM DD YYYY Why did you stop taking this medication? (  check all that apply) [] Allergic reaction [] Medication side effects [] Unable to       adhere/monitor [] Had an      operation/procedure      that required      stopping it [] Unable to afford it [] No longer wants        to take        this medication [] Provider decision [] Pregnancy [] Other (specify: ) [] Unknown Reason   If started or changed  What medication are you taking now? [] Benazepril (Lotensin) [] Captopril (Capoten) [] Enalapril (Vasotec) [] Fosinopril (Monopril) [] Lisinopril (Zestril, Prinivil) [] Quinapril (Accupril) [] Ramipril (Altace) [] Azilsartan (Edarbi) [] Candesartan (Atacand) [x] Irbesartan (Avapro) [] Losartan (Cozaar) [] Olmesartan (Benicar) [] Telmisartan (Micardis) [] Valsartan (Diovan) [] Sacrubitril/Valsartan Delene Loll)      Medication Are you currently taking [insert name of previous med]? If currently taking: If started since last visit: If stopped since last visit:  Statin []  No >  [x] Yes > [] Stopped since last visit [] Never prescribed [] Started since last visit [x] Same medication as last        visit [] Different medication or dose     than last visit [Study personnel to answer]  Documented in EHR? [] No [x] Yes    If no, Verified by: [] Photo of bottle [] Copy of rx [] Dispensing pharmacy [] Prescribing  provider [] Other (specify):                    Date started:        / /             MM DD YYYY Who prescribed this medication for you? [] Cardiology provider  [] Study clinic [] Outside clinic [] Endocrinology provider [] Primary care provider [] Other provider  Specify:                             [] Unknown Date discontinued:        / /             MM DD YYYY Why did you stop taking this medication? (check all that apply) [] Allergic reaction [] Medication side effects  [] Muscle aches [] Weakness [] Joint pain [] Cognitive symptoms [] Other (specify):                          [] Unable to        adhere/monitor [] Had an    operation/procedure    that required stopping it [] Unable to afford it [] No longer wants       to take this            medication [] Provider decision [] Pregnancy [] Other (specify: ) [] Unknown Reason   If started or changed  What medication are you taking now? [] Atorvastatin (Lipitor) [] Fluvastatin (Lescol) [] Lovastatin (Mevacor) [] Pravastatin (Pravachol) [x] Rosuvastatin (Crestor) [] Simvastatin (Zocor) [] Pitatavastatin (Livalo)  Dose: [] 1 mg  [] 10 mg [] 2 mg  []  20 mg [] 3 mg  [x]  40 mg [] 4 mg  []  60 mg [] 5 mg  []  80 mg  Frequency: [x] Daily []  Less than daily      Medication Are you currently taking [insert name of previous med]? If currently taking: If started since last visit: If stopped since last visit:  SGLT2 Inhibitor [x] No   [] Yes [] Stopped since last visit [x] Never prescribed [] Started since last visit [] Same medication as last      visit [] Different medication than last visit [Study personnel to answer]  Documented in EHR? [] No [] Yes   If no, Verified by: [] Photo of bottle [] Copy of rx [] Dispensing  pharmacy [] Prescribing       provider [] Other (specify):                    Date started:        / /             MM DD YYYY Who prescribed this medication for you? [] Cardiology provider  [] Study clinic [] Outside  clinic [] Endocrinology provider [] Primary care provider [] Other provider  Specify:                             [] Unknown Date discontinued:        / /             MM DD YYYY Why did you stop taking this medication? (check all that apply) [] Allergic reaction [] Medication side effects [] Unable to       adhere/monitor [] Had an        operation/procedure         that required           stopping it [] Patient unable to       afford it [] Patient no longer       wants to       take this medication [] Provider decision [] Pregnancy [] Other (specify: ) [] Unknown Reason   If started or changed  What medication are you taking now? [] Canaglifozin (Invokana) [] Dapagliflozin (Farxiga) [] Empaglifozin (Jardiance) [] Ertugliflozin (Steglatro)     GLP1 Receptor Agonist [x] No   [] Yes [] Stopped since last visit [x] Never prescribed [] Started since last visit [] Same medication as last      visit [] Different medication than       last visit [Study personnel to answer]  Documented in EHR? [] No [] Yes   If no, Verified by: [] Photo of bottle [] Copy of rx [] Dispensing       pharmacy [] Prescribing       provider [] Other (specify):                    Date started:        / /             MM DD YYYY Who prescribed this medication for you? [] Cardiology provider  [] Study clinic [] Outside clinic [] Endocrinology provider [] Primary care provider [] Other provider  Specify:                             [] Unknown Date discontinued:        / /             MM DD YYYY Why did you stop taking this medication? (check all that apply) [] Allergic reaction [] Medication side effects [] Unable to      adhere/monitor [] Had an    operation/procedure    that required stopping it [] Patient unable to        afford it [] Patient no longer        wants to take this        medication [] Provider decision [] Pregnancy [] Other (specify: ) [] Unknown Reason   If started or changed  What medication  are you taking now? [] Albiglutide (Tanzeum) [] Dulaglutide (Trulicity) [] Exanatide (Byetta, Bydureon) [] Liraglutide (Victoza, Saxenda) [] Lixisenatide (Adlyxin) [] Semaglutice (Ozempic)     03/15/2018 ([facility] Release)

## 2019-05-09 DIAGNOSIS — E7800 Pure hypercholesterolemia, unspecified: Secondary | ICD-10-CM | POA: Diagnosis not present

## 2019-05-09 DIAGNOSIS — E039 Hypothyroidism, unspecified: Secondary | ICD-10-CM | POA: Diagnosis not present

## 2019-05-09 DIAGNOSIS — E1165 Type 2 diabetes mellitus with hyperglycemia: Secondary | ICD-10-CM | POA: Diagnosis not present

## 2019-05-16 ENCOUNTER — Other Ambulatory Visit (HOSPITAL_COMMUNITY): Payer: Self-pay | Admitting: Endocrinology

## 2019-05-16 DIAGNOSIS — M25511 Pain in right shoulder: Secondary | ICD-10-CM | POA: Diagnosis not present

## 2019-05-16 DIAGNOSIS — E7800 Pure hypercholesterolemia, unspecified: Secondary | ICD-10-CM | POA: Diagnosis not present

## 2019-05-16 DIAGNOSIS — I10 Essential (primary) hypertension: Secondary | ICD-10-CM | POA: Diagnosis not present

## 2019-05-16 DIAGNOSIS — E039 Hypothyroidism, unspecified: Secondary | ICD-10-CM | POA: Diagnosis not present

## 2019-05-16 DIAGNOSIS — E1165 Type 2 diabetes mellitus with hyperglycemia: Secondary | ICD-10-CM | POA: Diagnosis not present

## 2019-06-08 MED FILL — CLOPIDOGREL 75 MG TABLET: 75 | 90 days supply | Qty: 90 | Fill #2

## 2019-06-08 MED FILL — POTASSIUM CHLORIDE CRYS ER: 10 | 30 days supply | Qty: 60 | Fill #3

## 2019-06-20 ENCOUNTER — Telehealth: Payer: Self-pay | Admitting: *Deleted

## 2019-06-20 NOTE — Telephone Encounter (Signed)
I spoke to patient and followed up with 59-month Coordinate-Diabetes Study.Patient stated all medications are the same since last call 3 months ago except Jardiance was added once a day. Patient also takes Amaryl in morning and in evening. The new medication change has changed blood sugars to 90's. Patient states she feels well.

## 2019-06-23 MED FILL — hydrOXYzine HCL 25 MG TABS: 25 | 30 days supply | Qty: 30 | Fill #9

## 2019-06-30 MED FILL — EZETIMIBE 10 MG TABS: 10 | 90 days supply | Qty: 90 | Fill #2

## 2019-06-30 MED FILL — ROSUVASTATIN CALCIUM 40 MG: 40 | 90 days supply | Qty: 90 | Fill #3

## 2019-07-07 MED FILL — GLIMEPIRIDE 1 MG TABLET: 1 | 30 days supply | Qty: 90 | Fill #2

## 2019-07-07 MED FILL — BISOPROLOL FUMARATE 5 MG TA: 5 | 90 days supply | Qty: 45 | Fill #1

## 2019-07-07 MED FILL — LEVOTHYROXINE 88 MCG TABLET: 88 | 90 days supply | Qty: 90 | Fill #2

## 2019-07-17 MED FILL — IRBESARTAN-HCTZ 150-12.5 MG: 150-12.5 | 30 days supply | Qty: 30 | Fill #7

## 2019-07-17 MED FILL — FREESTYLE LITE TEST STRIP: 50 days supply | Qty: 100 | Fill #2

## 2019-07-17 MED FILL — JARDIANCE 25 MG TABLET: 25 | 30 days supply | Qty: 30 | Fill #2

## 2019-07-19 DIAGNOSIS — I10 Essential (primary) hypertension: Secondary | ICD-10-CM | POA: Diagnosis not present

## 2019-07-19 DIAGNOSIS — Z6834 Body mass index (BMI) 34.0-34.9, adult: Secondary | ICD-10-CM | POA: Diagnosis not present

## 2019-07-19 DIAGNOSIS — E785 Hyperlipidemia, unspecified: Secondary | ICD-10-CM | POA: Diagnosis not present

## 2019-07-19 DIAGNOSIS — E039 Hypothyroidism, unspecified: Secondary | ICD-10-CM | POA: Diagnosis not present

## 2019-07-19 DIAGNOSIS — G4700 Insomnia, unspecified: Secondary | ICD-10-CM | POA: Diagnosis not present

## 2019-07-19 DIAGNOSIS — Z0001 Encounter for general adult medical examination with abnormal findings: Secondary | ICD-10-CM | POA: Diagnosis not present

## 2019-07-19 DIAGNOSIS — I2510 Atherosclerotic heart disease of native coronary artery without angina pectoris: Secondary | ICD-10-CM | POA: Diagnosis not present

## 2019-07-19 DIAGNOSIS — Z1231 Encounter for screening mammogram for malignant neoplasm of breast: Secondary | ICD-10-CM | POA: Diagnosis not present

## 2019-07-19 DIAGNOSIS — E6609 Other obesity due to excess calories: Secondary | ICD-10-CM | POA: Diagnosis not present

## 2019-07-20 DIAGNOSIS — I10 Essential (primary) hypertension: Secondary | ICD-10-CM | POA: Diagnosis not present

## 2019-07-25 ENCOUNTER — Other Ambulatory Visit (HOSPITAL_COMMUNITY): Payer: Self-pay | Admitting: Family Medicine

## 2019-07-25 MED FILL — hydrOXYzine HCL 25 MG TABS: 25 | 30 days supply | Qty: 30 | Fill #0

## 2019-07-31 MED FILL — MONTELUKAST SOD 10 MG TAB: 10 | 90 days supply | Qty: 90 | Fill #3

## 2019-08-01 ENCOUNTER — Encounter: Payer: Self-pay | Admitting: *Deleted

## 2019-08-01 DIAGNOSIS — I2510 Atherosclerotic heart disease of native coronary artery without angina pectoris: Secondary | ICD-10-CM

## 2019-08-01 NOTE — Research (Signed)
I spoke to patient for Coordinate-Diabetes Study 9 month visit.Patient is doing well. Her last hemoglobin A!C is 7.8% She will go back in 3 months to see her doctor. Her daily blood sugars are 79-100. Patient takes Glimepiride 2 mg qd and Jardiance 25 mg qd. I will talk to patient in  3 months.

## 2019-08-02 NOTE — Telephone Encounter (Signed)
Was able to get in touch with patient for Coordinate call. Updated all medication changes.

## 2019-08-15 MED FILL — GLIMEPIRIDE 1 MG TABLET: 1 | 30 days supply | Qty: 90 | Fill #3

## 2019-08-15 MED FILL — POTASSIUM CHLORIDE CRYS ER: 10 | 30 days supply | Qty: 60 | Fill #4

## 2019-08-15 MED FILL — IRBESARTAN-HCTZ 150-12.5 MG: 150-12.5 | 30 days supply | Qty: 30 | Fill #8

## 2019-08-15 MED FILL — JARDIANCE 25 MG TABLET: 25 | 30 days supply | Qty: 30 | Fill #3

## 2019-08-17 DIAGNOSIS — E039 Hypothyroidism, unspecified: Secondary | ICD-10-CM | POA: Diagnosis not present

## 2019-08-17 DIAGNOSIS — E7800 Pure hypercholesterolemia, unspecified: Secondary | ICD-10-CM | POA: Diagnosis not present

## 2019-08-17 DIAGNOSIS — E1165 Type 2 diabetes mellitus with hyperglycemia: Secondary | ICD-10-CM | POA: Diagnosis not present

## 2019-08-17 DIAGNOSIS — I10 Essential (primary) hypertension: Secondary | ICD-10-CM | POA: Diagnosis not present

## 2019-08-23 MED FILL — hydrOXYzine HCL 25 MG TABS: 25 | 30 days supply | Qty: 30 | Fill #1

## 2019-08-25 ENCOUNTER — Ambulatory Visit: Payer: 59 | Admitting: Adult Health

## 2019-08-25 ENCOUNTER — Other Ambulatory Visit: Payer: Self-pay

## 2019-08-25 ENCOUNTER — Encounter: Payer: Self-pay | Admitting: Adult Health

## 2019-08-25 ENCOUNTER — Telehealth: Payer: Self-pay | Admitting: Adult Health

## 2019-08-25 ENCOUNTER — Ambulatory Visit (INDEPENDENT_AMBULATORY_CARE_PROVIDER_SITE_OTHER): Payer: 59

## 2019-08-25 VITALS — BP 122/70 | HR 88 | Temp 97.9°F | Ht 64.5 in | Wt 204.6 lb

## 2019-08-25 DIAGNOSIS — R0989 Other specified symptoms and signs involving the circulatory and respiratory systems: Secondary | ICD-10-CM

## 2019-08-25 DIAGNOSIS — J449 Chronic obstructive pulmonary disease, unspecified: Secondary | ICD-10-CM

## 2019-08-25 DIAGNOSIS — J3089 Other allergic rhinitis: Secondary | ICD-10-CM

## 2019-08-25 DIAGNOSIS — J9811 Atelectasis: Secondary | ICD-10-CM | POA: Diagnosis not present

## 2019-08-25 MED ORDER — BREZTRI AEROSPHERE 160-9-4.8 MCG/ACT IN AERO: 2.0000 | INHALATION_SPRAY | Freq: Two times a day (BID) | RESPIRATORY_TRACT | 0 refills | Status: DC

## 2019-08-25 MED ORDER — BREZTRI AEROSPHERE 160-9-4.8 MCG/ACT IN AERO: 2.0000 | INHALATION_SPRAY | Freq: Two times a day (BID) | RESPIRATORY_TRACT | 5 refills | Status: DC

## 2019-08-25 NOTE — Assessment & Plan Note (Signed)
Cont on rx

## 2019-08-25 NOTE — Progress Notes (Signed)
@Patient  ID: Shelley [name], female    DOB: 07-09-1955, 64 y.o.   MRN: [id number]  Chief Complaint  Patient presents with  . 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  Patient has underlying moderate COPD.  She still works full-time.  Says that she is doing about the same.  Does notice that she gets short of breath with heavy activity especially if she has to walk a long stretch at work.  She does try to walk at home and also bowls on a regular basis She denies any flare of cough or wheezing.  She is on Craig daily.  Does read she could not find something that helps with her breathing a little bit more so she did not get short of breath. Uses her albuterol couple times a week.  Allergies seem to be under control she is on Allegra and Singulair daily.   Allergies  Allergen Reactions  . Coreg [Carvedilol] Shortness Of Breath  . Levothyroxine Itching    Patient thinks it was the dye in the tablets she was allergic to, she feels like "something is crawling under my skin"  . Lopressor [Metoprolol Tartrate] Cough    Fatigue   . Metformin And Related Diarrhea  . Norvasc [Amlodipine Besylate] Palpitations    "made my heart race"    Immunization History  Administered Date(s) Administered  . Influenza Split 10/03/2016  . Pneumococcal Polysaccharide-23 08/23/2017    Past Medical History:  Diagnosis Date  .  CAD (coronary artery disease)   . Cardiac arrhythmia   . DES exposure in utero   . Diabetes mellitus   . Dyslipidemia   . Hypertension   . MI (myocardial infarction) ([place])   . Shingles   . Thyroid disease     Tobacco History: Social History   Tobacco Use  Smoking Status Former Smoker  . Packs/day: 0.50  . Years: 34.00  . Pack years: 17.00  . Types: Cigarettes  . Quit date: 2008  . Years since quitting: 13.6  Smokeless Tobacco Former Systems developer  . Quit date: 01/05/2005   Counseling given: Not Answered   Outpatient Medications Prior to Visit  Medication Sig Dispense Refill  . ACCU-CHEK FASTCLIX LANCETS MISC   3  . ACCU-CHEK GUIDE test strip   3  . albuterol (VENTOLIN HFA) 108 (90 Base) MCG/ACT inhaler Inhale 2 puffs into the lungs every 6 (six) hours as needed for wheezing or shortness of breath. 1 Inhaler 5  . ASPIRIN ADULT LOW STRENGTH 81 MG EC tablet TAKE 1 TABLET BY MOUTH ONCE DAILY 30 tablet 3  . bisoprolol (ZEBETA) 5 MG tablet TAKE 1/2 TABLET BY MOUTH DAILY. NEEDS OFFICE VISIT. 45 tablet 2  . clopidogrel (PLAVIX) 75 MG tablet TAKE 1 TABLET BY MOUTH DAILY. 90 tablet 3  . diltiazem (CARDIZEM) 60 MG tablet Take 1 tablet (  60 mg total) by mouth 2 (two) times daily.    Marland Kitchen doxepin (SINEQUAN) 10 MG capsule Take 10 mg by mouth at bedtime as needed (sleep).    . ezetimibe (ZETIA) 10 MG tablet TAKE 1 TABLET BY MOUTH DAILY. 90 tablet 3  . fexofenadine (ALLEGRA) 180 MG tablet Take 180 mg by mouth daily.     . fluticasone furoate-vilanterol (BREO ELLIPTA) 100-25 MCG/INH AEPB Inhale 1 puff into the lungs daily. 1 each 0  . glimepiride (AMARYL) 1 MG tablet Take 3 mg by mouth in the morning and at bedtime.     . hydrOXYzine (ATARAX/VISTARIL) 25 MG tablet Take 25 mg by mouth 3 (three) times daily as needed (allergies).    . irbesartan-hydrochlorothiazide (AVALIDE) 150-12.5 MG tablet TAKE 1 TABLET BY MOUTH DAILY. 90 tablet 3  . JARDIANCE 25 MG TABS tablet Take 25 mg by mouth daily.    Marland Kitchen  levothyroxine (SYNTHROID, LEVOTHROID) 88 MCG tablet Take 88 mcg by mouth daily before breakfast.   4  . methocarbamol (ROBAXIN) 500 MG tablet Take 1 tablet (500 mg total) by mouth every 6 (six) hours as needed for muscle spasms. 40 tablet 0  . potassium chloride (KLOR-CON) 10 MEQ tablet Take 2 tablets (20 mEq total) by mouth daily. 60 tablet 9  . rosuvastatin (CRESTOR) 40 MG tablet Take 40 mg by mouth daily.   3  . montelukast (SINGULAIR) 10 MG tablet Take 1 tablet (10 mg total) by mouth at bedtime. 90 tablet 3  . glimepiride (AMARYL) 2 MG tablet Take 2 mg by mouth daily with breakfast. (Patient not taking: Reported on 08/25/2019)     No facility-administered medications prior to visit.     Review of Systems:   Constitutional:   No  weight loss, night sweats,  Fevers, chills, fatigue, or  lassitude.  HEENT:   No headaches,  Difficulty swallowing,  Tooth/dental problems, or  Sore throat,                No sneezing, itching, ear ache, + nasal congestion, post nasal drip,   CV:  No chest pain,  Orthopnea, PND, swelling in lower extremities, anasarca, dizziness, palpitations, syncope.   GI  No heartburn, indigestion, abdominal pain, nausea, vomiting, diarrhea, change in bowel habits, loss of appetite, bloody stools.   Resp:    No excess mucus, no productive cough,  No non-productive cough,  No coughing up of blood.  No change in color of mucus.  No wheezing.  No chest wall deformity  Skin: no rash or lesions.  GU: no dysuria, change in color of urine, no urgency or frequency.  No flank pain, no hematuria   MS:  No joint pain or swelling.  No decreased range of motion.  No back pain.    Physical Exam  BP 122/70 (BP Location: Left Arm, Cuff Size: Normal)   Pulse 88   Temp 97.9 F (36.6 C) (Other (Comment)) Comment (Src): wrist  Ht 5' 4.5" (1.638 m)   Wt 204 lb 9.6 oz (92.8 kg)   LMP 01/06/2003 (Approximate)   SpO2 95% Comment: Room air  BMI 34.58 kg/m   GEN: A/Ox3; pleasant ,  NAD, well nourished    HEENT:  [place]/AT,   NOSE-clear, THROAT-clear, no lesions, no postnasal drip or exudate noted.   NECK:  Supple w/ fair ROM; no JVD; normal carotid impulses w/o bruits; no thyromegaly or nodules palpated; no lymphadenopathy.    RESP  Clear  P & A; w/o, wheezes/ rales/  or rhonchi. no accessory muscle use, no dullness to percussion  CARD:  RRR, no m/r/g, no peripheral edema, pulses intact, no cyanosis or clubbing.  GI:   Soft & nt; nml bowel sounds; no organomegaly or masses detected.   Musco: Warm bil, no deformities or joint swelling noted.   Neuro: alert, no focal deficits noted.    Skin: Warm, no lesions or rashes    Lab Results:   BNP No results found for: BNP  ProBNP No results found for: PROBNP  Imaging: No results found.    PFT Results Latest Ref Rng & Units 08/23/2017  FVC-Pre L 2.21  FVC-Predicted Pre % 83  FVC-Post L 2.11  FVC-Predicted Post % 79  Pre FEV1/FVC % % 59  Post FEV1/FCV % % 59  FEV1-Pre L 1.31  FEV1-Predicted Pre % 62  FEV1-Post L 1.25  DLCO uncorrected ml/min/mmHg 10.95  DLCO UNC% % 43  DLVA Predicted % 57  TLC L 5.05  TLC % Predicted % 98  RV % Predicted % 131    Lab Results  Component Value Date   NITRICOXIDE 69 06/23/2017        Assessment & Plan:   No problem-specific Assessment & Plan notes found for this encounter.     Rexene Edison, NP 08/25/2019

## 2019-08-25 NOTE — Telephone Encounter (Signed)
Junie Panning is the pharmacist and you can reach her at [phone].

## 2019-08-25 NOTE — Patient Instructions (Addendum)
Chest xray today  Trial of BREZTRI 2 puffs Twice daily  , rinse after use  Stop BREO .  Albuterol As needed   Follow up with Dr. Halford Chessman  In 4- 6 months and As needed

## 2019-08-25 NOTE — Assessment & Plan Note (Signed)
COPD-seems to be under control.  Patient does have some ongoing symptoms of shortness of breath with activity suspect is a combination of her moderate COPD and deconditioning.  We will check chest x-ray today.  Change to a triple therapy inhaler.  Plan  Patient Instructions  Chest xray today  Trial of BREZTRI 2 puffs Twice daily  , rinse after use  Stop BREO .  Albuterol As needed   Follow up with Dr. Halford Chessman  In 4- 6 months and As needed

## 2019-08-25 NOTE — Telephone Encounter (Signed)
Message from patient's pharmacy, please advise.  Pt takes potassium and Breztri that was called in is a contraindication with the potassium. Please advise.

## 2019-08-25 NOTE — Telephone Encounter (Signed)
I have never heard that please have pharmacist call our office with explanation

## 2019-08-26 NOTE — Progress Notes (Signed)
Reviewed and agree with assessment/plan.   Chesley Mires, MD [facility] Pulmonary/Critical Care 08/26/2019, 10:34 AM Pager:  [phone]

## 2019-08-29 NOTE — Telephone Encounter (Signed)
I have not heard of the interaction of BREZTRI and Potassium  Did not hear back from Pharmacy . Tried to call number no answer.  Please advise patient, not aware of interaction. She is welcome to discuss reason with Pharmacy , if not able to take resume BREO .  Please contact office for sooner follow up if symptoms do not improve or worsen or seek emergency care

## 2019-08-29 NOTE — Telephone Encounter (Signed)
[place] stating to them that provider has never heard of a contraindication between [facility] and Potassium and they verbalized understanding stating that they were going to put an override in. Nothing further needed.

## 2019-09-15 MED FILL — JARDIANCE 25 MG TABLET: 25 | 30 days supply | Qty: 30 | Fill #4

## 2019-09-15 MED FILL — CLOPIDOGREL 75 MG TABLET: 75 | 90 days supply | Qty: 90 | Fill #3

## 2019-09-15 MED FILL — IRBESARTAN-HCTZ 150-12.5 MG: 150-12.5 | 30 days supply | Qty: 30 | Fill #9

## 2019-09-22 MED FILL — hydrOXYzine HCL 25 MG TABS: 25 | 30 days supply | Qty: 30 | Fill #2

## 2019-10-04 ENCOUNTER — Other Ambulatory Visit (HOSPITAL_COMMUNITY): Payer: Self-pay | Admitting: Family Medicine

## 2019-10-04 MED FILL — EZETIMIBE 10 MG TABS: 10 | 90 days supply | Qty: 90 | Fill #3

## 2019-10-04 MED FILL — ROSUVASTATIN CALCIUM 40 MG: 40 | 90 days supply | Qty: 90 | Fill #0

## 2019-10-04 MED FILL — BISOPROLOL FUMARATE 5 MG TA: 5 | 90 days supply | Qty: 45 | Fill #2

## 2019-10-09 ENCOUNTER — Other Ambulatory Visit (HOSPITAL_COMMUNITY): Payer: Self-pay | Admitting: Endocrinology

## 2019-10-09 MED FILL — GLIMEPIRIDE 1 MG TABLET: 1 | 30 days supply | Qty: 90 | Fill #4

## 2019-10-09 MED FILL — LEVOTHYROXINE 88 MCG TABLET: 88 | 90 days supply | Qty: 90 | Fill #0

## 2019-10-20 MED FILL — IRBESARTAN-HCTZ 150-12.5 MG: 150-12.5 | 30 days supply | Qty: 30 | Fill #10

## 2019-10-20 MED FILL — POTASSIUM CHL ER M10 TABLET: 10 | 30 days supply | Qty: 60 | Fill #5

## 2019-10-20 MED FILL — JARDIANCE 25 MG TABLET: 25 | 30 days supply | Qty: 30 | Fill #5

## 2019-10-27 MED FILL — hydrOXYzine HCL 25 MG TABS: 25 | 30 days supply | Qty: 30 | Fill #3

## 2019-10-27 MED FILL — FREESTYLE LITE TEST STRIP: 50 days supply | Qty: 100 | Fill #3

## 2019-11-01 ENCOUNTER — Other Ambulatory Visit: Payer: Self-pay | Admitting: Pulmonary Disease

## 2019-11-01 NOTE — Telephone Encounter (Addendum)
Pt is requesting a refill on MONTELUKAST SOD 10 MG TAB 10 Tablet next ov with tammy parrett 02/21/20. Pt  Was left vm stating meds are available for pick up at pharmacy

## 2019-11-02 ENCOUNTER — Encounter: Payer: Self-pay | Admitting: *Deleted

## 2019-11-02 ENCOUNTER — Other Ambulatory Visit: Payer: Self-pay | Admitting: Pulmonary Disease

## 2019-11-02 DIAGNOSIS — Z006 Encounter for examination for normal comparison and control in clinical research program: Secondary | ICD-10-CM

## 2019-11-02 MED FILL — MONTELUKAST SOD 10 MG TAB: 10 | 90 days supply | Qty: 90 | Fill #0

## 2019-11-02 NOTE — Research (Signed)
I spoke to patient for 4-month Coordinate-Diabetes study. Patient is following closely with  her physician for diabetes. Last hemoglobin A1C was 7.0 which improved from 7.5. I thanked patient for participating in the study.

## 2019-11-03 ENCOUNTER — Encounter: Payer: Self-pay | Admitting: *Deleted

## 2019-11-03 NOTE — Progress Notes (Signed)
COORDINATE-Diabetes 12 Month CASE REPORT FORM (Intervention) -EHR Site #:   [id number]              Patient ID: [id number]   84 ZYSAY EHR REVIEW  Medical Record Check Date 11/02/2019  Vital Status '[x]' Patient Alive >Date last known alive per EHR: 08/29/2019  '[]' Patient Dead >> Complete Death Form  '[]' Unknown   CLINICAL EVENTS / PROCEDURES  Hospitalization since last visit? (>=24 hour stay) '[x]' No '[]' Yes  >> if yes, Complete the following  Date of hospital admission:        / /             MM DD YYYY  Primary discharge diagnosis:  *Complete appropriate event validation form '[]' acute myocardial infarction (heart attack)* '[]' stroke* '[]' heart failure* '[]' coronary revascularization* '[]' peripheral revascularization* '[]' cerebral revascularization* '[]' diabetes (e.g. hypoglycemia, DKA) '[]' renal failure '[]' amputation '[]' other cardiovascular reason '[]' other NON-cardiovascular reason '[]' unknown  Other diagnoses not documented above: (check all that apply)  *Complete appropriate event validation form '[]' acute myocardial infarction (heart attack)* '[]' stroke* '[]' heart failure* '[]' coronary revascularization* '[]' peripheral revascularization* '[]' cerebral revascularization* '[]' diabetes (e.g. hypoglycemia, DKA) '[]' renal failure '[]' amputation '[]' other cardiovascular reason '[]' other NON-cardiovascular reason '[]' unknown  Were any of the following outpatient procedures done since the last visit? (I.e. procedures not captured above)  Coronary revascularization '[x]' No '[]' Yes >IF YES, Date / /             MM DD YYYY  Peripheral revascularization '[x]' No '[]' Yes > IF YES, Date / /             MM DD YYYY  Cerebral revascularization '[x]' No '[]' Yes > IF YES, Date / /             MM DD YYYY  Extremity amputation    '[x]' No '[]' Yes >IF YES, Date / /             MM      DD       YYYY  Renal replacement therapy (i.e. dialysis)    '[x]' No '[]' Yes > IF YES, Date of initiation / /             MM      DD       YYYY   **EDC will allow for  collection of multiple hospitalizations and procedures   MEDICATIONS  Medication Currently Prescribed? If started since last visit: If not started since last visit: If stopped since last visit:  Cardiac Medications  ACE Inhibitor / Angiotensin Receptor Blocker (ARB) / Angiotensin Receptor Neprilysin inhibitor (ARNi)  '[]' No >  '[x]' Yes > Since last visit, medication was: '[]' Stopped '[]' Not started '[]' Started '[x]' Continued same medication '[]' Continued with medication changes Date started:        / /             MM DD YYYY Who prescribed? '[]' Cardiology provider  '[]' Study clinic '[]' Outside clinic '[]' Endocrinology provider '[]' Primary care provider '[]' Other provider  Specify:                             '[]' Unknown Reason (check all that apply): '[]' History of swelling around lips, eyes or face '[]' Feeling dizzy/lightheaded '[]' Low blood pressure '[]' Poor or fluctuating kidney function '[]' High potassium '[]' Patient has experienced other side effects to this medication  before '[]' Patient will be unable to adhere/monitor '[]' Patient unable to afford it '[]' Patient does not want to      take this medication '[]' Pregnancy '[]' Other (specify: ) '[]' Unknown Reason Date discontinued:        / /  MM DD YYYY Reason (check all that apply): '[]' Swelling around lips, eyes       or face '[]' Feeling dizzy/lightheaded '[]' Low blood pressure '[]' Poor or fluctuating kidney function '[]' High potassium '[]' Other medication side       effects '[]' Patient unable to        adhere/monitor '[]' Had an       operation/procedure that        required stopping it '[]' Patient unable to afford it '[]' Patient no longer wants       to take this medication '[]' Pregnancy '[]' Other (specify: ) '[]' Unknown Reason   If started or changed  Medication Name: '[]' Benazepril (Lotensin) '[]' Captopril (Capoten) '[]' Enalapril (Vasotec) '[]' Fosinopril (Monopril) '[]' Lisinopril (Zestril, Prinivil) '[]' Quinapril (Accupril) '[]' Ramipril (Altace) '[]' Azilsartan  (Edarbi) '[]' Candesartan (Atacand) '[]' Irbesartan (Avapro) '[]' Losartan (Cozaar) '[]' Olmesartan (Benicar) '[]' Telmisartan (Micardis) '[]' Valsartan (Diovan) '[]' Sacrubitril/Valsartan (Entresto)     Beta Blocker '[]' No '[x]'  Yes > '[]' Acebutolol (Sectral) '[x]' Bisoprolol (Zebeta) '[]' Carvedilol (Coreg) '[]' Labetalol (Trandate,      Normodyne) '[]' Metoprolol succinate (Toprol) '[]' Metoprolol tartrate       (Lopressor) '[]' Nadolol (Corgard) '[]' Nebivolol (Bystolic) '[]' Propranolol (Inderal) '[]' Sotalol (Betapace)     Medication Currently Prescribed? If started since last visit: If not started since last visit: If stopped since last visit:  Aldosterone Antagonist '[x]' No '[]' Yes > '[]' Amiloride '[]' Eplerenone (Inspra) '[]' Spirinolactone (Aldactone) '[]' Traimterene (Dyrenium)   Calcium Channel Blocker '[]' No '[x]'  Yes > Medication Name: '[]' Amlodipine (Norvasc) '[x]' Diltiazem (Cardizem) '[]' Felodipine (Plendil) '[]' Nifedipine (Procardia) '[]' Verapamil (Calan)   Diuretic Loop '[x]' No '[]'  Yes > Medication Name: '[]' Bumetanide (Bumex) '[]' Ethacrynic acid (Edecrin) '[]' Furosemide (Lasix) '[]' Torsemide (Demadex)   Diuretic Thiazide- type '[]' No '[x]'  Yes > Medication Name: '[]' Chlorothiazide      '[]' Chlorthalidone '[x]' Hydrochlorothiazide '[]' Indapamide '[]' Metolazone   Anticoagulation Therapy (other than Warfarin) '[x]' No '[]'  Yes > Medication Name: '[]' Apixaban (Eliquis) '[]' Edoxaban (Lixiana) '[]' Rivaroxaban Alen Blew) '[]' Dabigatran (Redaxa)   Warfarin '[x]' No '[]'  Yes    Antiplatelet Agent (including aspirin) '[]' No '[x]'  Yes > Medication Name (check all that apply): '[x]' Aspirin '[x]' Clopidogrel (Plavix) '[]' Prasugrel (Effient) '[]' Ticagrelor (Brilinta) '[]' Ticlopidine (Ticlid) '[]' Dipyridamole (Persantine)    Medication Currently Prescribed? If started since last visit: If not started since last visit: If stopped since last visit:  Statin  '[]' No >  '[x]' Yes > Since last visit, medication was: '[]' Stopped '[]' Not started '[]' Started '[x]' Continued  same medication and dose '[]' Continued with dose or medication changes Date started:        / /             MM DD YYYY Who prescribed? '[]' Cardiology provider '[]' Endocrinology provider '[]' Primary care provider '[]' Other provider  Specify:                             '[]' Unknown Reason (check all that apply): '[]' History of Rhabdomyolysis '[]' LDL-cholesterol already       <70 '[]' Muscle      aches/pain/weakness '[]' Mental       fogginess/memory loss '[]' Liver dysfunction '[]' Patient has  experienced other side   effects to this medication before '[]' Patient will be unable to adhere/monitor '[]' Patient unable to afford it '[]' Patient does not want to take this medication '[]' Pregnancy '[]' Other (specify: ) '[]' Unknown Reason Date discontinued:        / /             MM DD YYYY Reason (check all that apply): '[]' Rhabdomyolysis '[]' Muscle aches/pain/weakness '[]' Mental fogginess/memory loss '[]' Liver dysfunction '[]' Other medication side effects '[]' Patient unable to          adhere/monitor '[]' Patient unable to afford it '[]' Patient no longer wants to take  this medication '[]' Pregnancy '[]' Other (specify: ) '[]' Unknown Reason   If started or changed  Medication Name: '[]' Atorvastatin (Lipitor) '[]' Fluvastatin (Lescol) '[]' Lovastatin (Mevacor) '[]' Pravastatin (Pravachol) '[]' Rosuvastatin (Crestor) '[]' Simvastatin (Zocor) '[]' Pitatavastatin (Livalo)  Dose: '[]' 1 mg '[]' 10 mg '[]' 2 mg '[]'  20 mg '[]' 3 mg '[]'  40 mg '[]' 4 mg '[]'  60 mg '[]' 5 mg '[]'  80 mg  Frequency: '[]' Daily  '[]' Less than daily      Does the patient have statin intolerance that prevents the use of maximum dose of high potency statin? '[x]' No '[]' Yes >IF YES, Complete Statin Intolerance form   Non-statin lipid lowering therapy '[]' No '[x]' Yes > Medication Name (check all that apply): '[]' Colesevelam (Welchol) '[x]' Ezetimibe (Zetia) '[]' Fibrate '[]' Niacin '[]' PCSK9 inhibitor '[]' Omega 3 acid ethyl esters (Lovaza) '[]' Icosapent Ethyl (Vascepa) '[]' Over the counter omega 3 fatty acid  or fish oil supplement     Medication Currently Prescribed? If started since last visit: If not started since last visit: If stopped since last visit:  Diabetes Medications  SGLT2 Inhibitor  '[]' No >  '[x]' Yes > Since last visit, medication was: '[]' Stopped '[]' Not started '[]' Started '[x]' Continued same medication '[]' Continued with medication changes Date started:        / /             MM DD YYYY Who prescribed? '[]' Cardiology provider  '[]' Study clinic '[]' Outside clinic '[]' Endocrinology      provider '[]' Primary care provider '[]' Other provider  Specify:                             '[]' Unknown Reason (check all that apply): '[]' eGFR <45 '[]' HbA1c<7% on metformin monotherapy OR already on GLP1RA and do not need to start another anti- hyperglycemic '[]' Already dehydrated '[]' Low blood pressure '[]' High risk of Hypoglycemia '[]' Prior DKA '[]' Recurrent mycotic genital infections '[]' History of or at risk for amputation '[]' Patient has experienced other side effects to this medication before '[]' Patient will be unable to adhere/monitor '[]' Patient unable to afford it '[]' Patient does not want to take this medication '[]' Pregnancy '[]' Other (specify: ) '[]' Unknown Reason Date discontinued:        / /             MM DD YYYY Reason (check all that apply  '[]' eGFR now <45 '[]' Dehydration '[]' Low blood pressure '[]' Hypoglycemia '[]' DKA '[]' Mycotic genital infection '[]' Amputation '[]' Other medication side effects '[]' Patient unable    to adhere/monitor  '[]' Had an operation/procedure     that required stopping it '[]' Patient unable to afford it '[]' Patient no longer wants to      take this medication '[]' Pregnancy '[]' Other (specify: ) '[]' Unknown Reason   If started or changed  Medication Name: '[]' Canaglifozin (Invokana) '[]' Dapagliflozin Wilder Glade) '[]' Empaglifozin (Jardiance) '[]' Ertugliflozin Actuary)           Medication Currently Prescribed? If started since last visit: If not started since last visit: If stopped since last  visit:  GLP1 Receptor Agonist  '[x]' No >  '[]' Yes > Since last visit, medication was: '[]' Stopped '[x]' Not started '[]' Started '[]' Continued same medication '[]' Continued with medication changes Date started:        / /             MM DD YYYY Who prescribed? '[]' Cardiology provider  '[]' Study clinic '[]' Outside clinic '[]' Endocrinology       provider '[]' Primary care provider '[]' Other provider > Specify:                             '[]' Unknown Reason (check all that apply): '[]' Personal or family  history of medullary thyroid cancer '[]' MEN2 '[]' HbA1c<7% on metformin monotherapy OR already on SGLT2i and do not need to start another anti-hyperglycemic '[]' eGFR now <30 '[]' High risk of Hypoglycemia '[]' History of pancreatitis '[]' Significant gastroparesis '[]' Prior gastric surgery '[]' Patient has experienced other side effects to this medication before '[]' Patient will be unable to adhere/monitor '[]' Patient unable to afford it '[]' Patient does not want to take this medication '[]' Pregnancy '[]' Other (specify: ) '[]' Unknown Reason Date discontinued:        / /             MM DD YYYY Reason (check all that apply): '[]' Medullary thyroid cancer '[]' MEN2 '[]' eGFR now <30 '[]' Hypoglycemia '[]' Pancreatitis '[]' Significant gastroparesis '[]' Gastric surgery '[]' Other medication side       effects '[]'   Patient  unable    to adhere/monitor                                  '[]' Had an operation/procedure that required stopping it '[]' Patient unable to afford it '[]' Patient no longer wants to take this medication '[]' Pregnancy '[]' Other (specify: ) '[]' Unknown Reason   If started or changed > Medication Name: '[]' Albiglutide (Tanzeum) '[]' Dulaglutide (Trulicity) '[]' Exanatide (Byetta, Bydureon) '[]' Liraglutide (Victoza, Saxenda) '[]' Lixisenatide (Adlyxin) '[]' Semaglutice (Ozempic)      Medication Currently Prescribed? If started since last visit: If not started since last visit: If stopped since last visit:  Other non Insulin diabetes medications '[]' No  '[x]' Yes > Medication Name (check all that apply): '[]' Acarbose (Precose) '[]' Miglitol (Glyset) '[]' Glimepiride (Amaryl) '[x]' Glipizide (Amaryl) '[]' Glyburide (Diabeta,       Glynase,   Micronase) '[]' Metformin (Fortamet,        Glucophage[including XR],        Glumetza, Riomet) '[]' Pioglitazone (Actos) '[]' Nateglinide (Starlix) '[]' Pramlintide (Symilin) '[]' Repaglinide (Prandin) '[]' Rosiglitazone (Avandia) '[]' Alogliptin (Nesina) '[]' Linagliptin (Tradjenta) '[]' Saxagliptin (Onglyza) '[]' Sitagliptin (Januvia) '[]' Bromocriptine Quick Release (Cycloset)     Insulin '[x]' No '[]'  Yes > total daily dose: units     STATIN INTOLERANCE (PER EHR/OTHER SOURCE DATA)  1. Was CK checked? '[]' No '[]' Yes   >If yes, select from the following: '[]' CK not elevated '[]' CK elevated 1-5x upper limit of normal '[]' CK elevated >5x upper limit of normal  2. Does the patient have muscle symptoms? '[]' No '[]' Yes    >If yes, select from the following: Location and pattern of muscle symptoms (select all that apply) '[]' Symmetric, hip flexors or thighs '[]' Symmetric, calves '[]' Symmetric, proximal upper extremity '[]' Asymmetric, intermittent, or not specific to any area '[]' Unknown   Timing of muscle symptom in relation to starting statin regimen '[]' <4 weeks '[]' 4-12 weeks '[]' >12 weeks '[]' Unknown   Timing of muscle symptoms improvement after withdrawal of statin '[]' <2 weeks '[]' 2-4 weeks '[]' No improvement after 4 weeks '[]' Unknown  3. Was patient re-challenged with a statin regimen (even if same statin compound or regimen as above)?  '[]' No  '[]' Yes  '[]' Unknown  >If yes, select from the following: Timing of recurrence of similar muscle symptoms in relation to starting second regimen '[]' <4 weeks '[]' 4-12 weeks '[]' >12 weeks '[]' Similar symptoms did not recur '[]' Unknown   [email or web address]

## 2019-11-20 ENCOUNTER — Other Ambulatory Visit: Payer: Self-pay | Admitting: Cardiology

## 2019-11-20 MED FILL — IRBESARTAN-HCTZ 150-12.5 MG: 150-12.5 | 30 days supply | Qty: 30 | Fill #11

## 2019-11-20 MED FILL — GLIMEPIRIDE 1 MG TABLET: 1 | 30 days supply | Qty: 90 | Fill #5

## 2019-11-20 MED FILL — JARDIANCE 25 MG TABLET: 25 | 30 days supply | Qty: 30 | Fill #6

## 2019-11-22 MED FILL — hydrOXYzine HCL 25 MG TABS: 25 | 30 days supply | Qty: 30 | Fill #4

## 2019-11-23 ENCOUNTER — Other Ambulatory Visit: Payer: Self-pay

## 2019-11-23 ENCOUNTER — Ambulatory Visit: Payer: 59 | Admitting: Cardiovascular Disease

## 2019-11-23 VITALS — BP 116/78 | HR 69 | Ht 65.0 in | Wt 203.0 lb

## 2019-11-23 DIAGNOSIS — E119 Type 2 diabetes mellitus without complications: Secondary | ICD-10-CM

## 2019-11-23 DIAGNOSIS — E669 Obesity, unspecified: Secondary | ICD-10-CM

## 2019-11-23 DIAGNOSIS — E785 Hyperlipidemia, unspecified: Secondary | ICD-10-CM | POA: Diagnosis not present

## 2019-11-23 DIAGNOSIS — I2510 Atherosclerotic heart disease of native coronary artery without angina pectoris: Secondary | ICD-10-CM | POA: Diagnosis not present

## 2019-11-23 DIAGNOSIS — E66811 Obesity, class 1: Secondary | ICD-10-CM

## 2019-11-23 DIAGNOSIS — I10 Essential (primary) hypertension: Secondary | ICD-10-CM | POA: Diagnosis not present

## 2019-11-23 MED ORDER — DILTIAZEM HCL 60 MG PO TABS: 60.0000 mg | ORAL_TABLET | Freq: Two times a day (BID) | ORAL | 11 refills | Status: DC

## 2019-11-23 NOTE — Patient Instructions (Signed)

## 2019-11-24 ENCOUNTER — Telehealth: Payer: Self-pay | Admitting: Cardiovascular Disease

## 2019-11-24 ENCOUNTER — Other Ambulatory Visit: Payer: Self-pay | Admitting: Cardiovascular Disease

## 2019-11-24 MED ORDER — DILTIAZEM HCL 60 MG PO TABS: 60.0000 mg | ORAL_TABLET | Freq: Two times a day (BID) | ORAL | 3 refills | Status: DC

## 2019-11-24 MED FILL — dilTIAZem HCL 60 MG TABS: 60 | 90 days supply | Qty: 180 | Fill #0

## 2019-11-24 NOTE — Progress Notes (Signed)
Cardiology office note    Date:  11/25/2019   ID:  Shelley [name], DOB [birth year], MRN [id number]  PCP:  [name], MD  Cardiologist:  Keidrick Murty Electrophysiologist:  None   Evaluation Performed:  Follow-Up Visit  Chief Complaint:  CAD  History of Present Illness:    Shelley [name] is a 64 y.o. female with premature onset coronary artery disease, diabetes mellitus, hypertension and obesity.  Shelley [name] is feeling quite well and has no cardiovascular complaints.  She still works full-time but is thinking of retiring next year.  She enjoys bowling competitively.  The patient specifically denies any chest pain at rest exertion, dyspnea at rest or with exertion, orthopnea, paroxysmal nocturnal dyspnea, syncope, palpitations, focal neurological deficits, intermittent claudication, lower extremity edema, unexplained weight gain, cough, hemoptysis or wheezing.  Her last revascularization was in July 2017 for a non-STEMI when she received a drug-eluting stent to the RCA.     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 She has a rich history of coronary disease. In 2001, when she was only 64 years old she received a bare-metal stent to the mid LAD artery. In 2008 she presented with an acute myocardial infarction and received a 3 x 24 mm drug-eluting Taxus stent to the LAD artery. Subsequently underwent staged revascularization with a drug-eluting Cypher 2.5 x 13 mm to the distal LAD artery and a 3.0 x 23 mm drug-eluting Cypher stent to the right coronary artery. July 31 2014 received drug-eluting stent to the distal RCA PROMUS PREM MR 3.0X16 for STEMI. She has normal left ventricular systolic function. She has never had symptoms of congestive heart failure. She had pruritus that she associated with taking long-acting diltiazem resolved once we switched to immediate release diltiazem. She tried [place] but developed yeast infections.   Past Medical History:  Diagnosis Date  .  CAD (coronary artery disease)   . Cardiac arrhythmia   . DES exposure in utero   . Diabetes mellitus   . Dyslipidemia   . Hypertension   . MI (myocardial infarction) ([place])   . Shingles   . Thyroid disease    Past Surgical History:  Procedure Laterality Date  . BACK SURGERY  5/99 & 6/99  . CARDIAC CATHETERIZATION  01/12/2002   stent patent  . CARDIAC CATHETERIZATION  09/03/2006   patent stents  . CARDIAC CATHETERIZATION N/A 07/31/2015   Procedure: Left Heart Cath and Coronary Angiography;  Surgeon: Burnell Blanks, MD;  Location: [facility];  Service: Cardiovascular;  Laterality: N/A;  . CARDIAC CATHETERIZATION N/A 07/31/2015   Procedure: Coronary Stent Intervention;  Surgeon: Burnell Blanks, MD;  Location: [facility];  Service: Cardiovascular;  Laterality: N/A;  . CARDIAC CATHETERIZATION N/A 08/02/2015   Procedure: Coronary Stent Intervention;  Surgeon: Leonie Man, MD;  Location: [facility];  Service: Cardiovascular;  Laterality: N/A;  . CORONARY ANGIOPLASTY WITH STENT PLACEMENT  07/17/1998   stent LAD  . CORONARY ANGIOPLASTY WITH STENT PLACEMENT  01/07/2006   stent RCA,LAD stent patent  . CORONARY ANGIOPLASTY WITH STENT PLACEMENT  03/18/2006   stent to mid RCA,prox.RCA and LAD stent patent  . CRYOTHERAPY    . THYROID SURGERY     PT. DENIES/   just radiation RADIATION  . TOTAL HIP ARTHROPLASTY Right 12/21/2017   Procedure: TOTAL HIP ARTHROPLASTY ANTERIOR APPROACH;  Surgeon: Paralee Cancel, MD;  Location: [facility];  Service: Orthopedics;  Laterality: Right;  70 mins  . TUBAL  LIGATION       No outpatient medications have been marked as taking for the 11/23/19 encounter (Office Visit) with Sanda Klein, MD.     Allergies:   Coreg [carvedilol], Levothyroxine, Lopressor [metoprolol tartrate], Metformin and related, and Norvasc [amlodipine besylate]   Social History   Tobacco Use  . Smoking status: Former Smoker    Packs/day: 0.50    Years:  34.00    Pack years: 17.00    Types: Cigarettes    Quit date: 2008    Years since quitting: 13.8  . Smokeless tobacco: Former Systems developer    Quit date: 01/05/2005  Vaping Use  . Vaping Use: Never used  Substance Use Topics  . Alcohol use: No    Alcohol/week: 0.0 standard drinks  . Drug use: No     Family Hx: The patient's family history includes Esophageal cancer in her maternal aunt; Hypertension in her mother; Prostate cancer in her maternal uncle; Thyroid disease in her mother.  ROS:   Please see the history of present illness.   All other systems are reviewed and are negative.   Prior CV studies:   The following studies were reviewed today:  Labs/Other Tests and Data Reviewed:    EKG: ECG was ordered today shows normal sinus rhythm.  T wave inversions are seen in old inferior leads as well as the anterior leads V2-V6, but are unchanged from 2019.  The QTC is normal at 430 ms.  Recent Labs: No results found for requested labs within last 8760 hours.  05/09/2019 Creatinine 0.97, normal liver function tests, TSH 1.52 Recent Lipid Panel Lab Results  Component Value Date/Time   CHOL 209 (H) 08/03/2015 04:50 AM   TRIG 105 08/03/2015 04:50 AM   HDL 32 (L) 08/03/2015 04:50 AM   CHOLHDL 6.5 08/03/2015 04:50 AM   LDLCALC 156 (H) 08/03/2015 04:50 AM   07/12/2018 Chol 102, HDL 32, LDL 69, TG 88 Creatinine 1.06, K 3.7, Hgb 12.6, normal LFTs  05/09/2019 Cholesterol 120, HDL 41, LDL 54, triglycerides 145, hemoglobin A1c 7.8%  Wt Readings from Last 3 Encounters:  11/23/19 203 lb (92.1 kg)  08/25/19 204 lb 9.6 oz (92.8 kg)  11/04/18 205 lb (93 kg)     Objective:    Vital Signs:  BP 116/78   Pulse 69   Ht 5\' 5"  (1.651 m)   Wt 203 lb (92.1 kg)   LMP 01/06/2003 (Approximate)   SpO2 94%   BMI 33.78 kg/m     General: Alert, oriented x3, no distress, appears well, mildly obese Head: no evidence of trauma, PERRL, EOMI, no exophtalmos or lid lag, no myxedema, no xanthelasma; normal  ears, nose and oropharynx Neck: normal jugular venous pulsations and no hepatojugular reflux; brisk carotid pulses without delay and no carotid bruits Chest: clear to auscultation, no signs of consolidation by percussion or palpation, normal fremitus, symmetrical and full respiratory excursions Cardiovascular: normal position and quality of the apical impulse, regular rhythm, normal first and second heart sounds, no murmurs, rubs or gallops Abdomen: no tenderness or distention, no masses by palpation, no abnormal pulsatility or arterial bruits, normal bowel sounds, no hepatosplenomegaly Extremities: no clubbing, cyanosis or edema; 2+ radial, ulnar and brachial pulses bilaterally; 2+ right femoral, posterior tibial and dorsalis pedis pulses; 2+ left femoral, posterior tibial and dorsalis pedis pulses; no subclavian or femoral bruits Neurological: grossly nonfocal Psych: Normal mood and affect   ASSESSMENT & PLAN:    1. Coronary artery disease involving native coronary artery without  angina pectoris, unspecified whether native or transplanted heart   2. Essential hypertension   3. Dyslipidemia (high LDL; low HDL)   4. Obesity (BMI 30.0-34.9)   5. Non-insulin treated type 2 diabetes mellitus ([place])      1. CAD:  Asymptomatic despite active lifestyle.  Normal LV function.  Last revascularization was more than 4 years ago.  Extensive coronary disease so we have planned lifelong clopidogrel therapy.  On beta-blocker (preferred highly selective bisoprolol due to reactive airway disease; tolerates as long as the dose is low). 2. HTN:  Very well controlled. on immediate release diltiazem since she had pruritus with the sustained release formulation capsules. Needs to take the IR preparation at least twice daily. Feels better on calcium channel blockers than on higher dose beta blockers. 3. HLP:   On statin with excellent LDL cholesterol. 4. Obesity: Strongly encourage weight loss to help improve her low  HDL cholesterol level. 5. DM:  Glycemic control is not perfect, considering her underlying comorbid conditions which would impose a hemoglobin A1c of 7% or less. She had side effects with SGLT-2 inhibitors at first, but currently seems to be tolerating Jardiance.  On ARB for renal protection.  Patient Instructions  Medication Instructions:  No changes *If you need a refill on your cardiac medications before your next appointment, please call your pharmacy*   Lab Work: None ordered If you have labs (blood work) drawn today and your tests are completely normal, you will receive your results only by: Marland Kitchen MyChart Message (if you have MyChart) OR . A paper copy in the mail If you have any lab test that is abnormal or we need to change your treatment, we will call you to review the results.   Testing/Procedures: None ordered   Follow-Up: At [facility], you and your health needs are our priority.  As part of our continuing mission to provide you with exceptional heart care, we have created designated Provider Care Teams.  These Care Teams include your primary Cardiologist (physician) and Advanced Practice Providers (APPs -  Physician Assistants and Nurse Practitioners) who all work together to provide you with the care you need, when you need it.  We recommend signing up for the patient portal called "MyChart".  Sign up information is provided on this After Visit Summary.  MyChart is used to connect with patients for Virtual Visits (Telemedicine).  Patients are able to view lab/test results, encounter notes, upcoming appointments, etc.  Non-urgent messages can be sent to your provider as well.   To learn more about what you can do with MyChart, go to [email or web address].    Your next appointment:   12 month(s)  The format for your next appointment:   In Person  Provider:   You may see Sanda Klein, MD or one of the following Advanced Practice Providers on your designated Care Team:     Almyra Deforest, PA-C  Fabian Sharp, PA-C or   Roby Lofts, [place]    Follow Up:  In Person 1 year  Signed, Sanda Klein, MD  11/25/2019 10:02 AM    [place]

## 2019-11-24 NOTE — Telephone Encounter (Signed)
Spoke with Jinny Blossom at [facility] regarding patients Diltiazem 60mg  BID prescription. Jinny Blossom states that they have not received order electronically. Patient seen yesterday in office by Dr. Sallyanne Kuster. Diltiazem 60mg  BID refilled, advised Megan to return call if any issues.

## 2019-11-24 NOTE — Telephone Encounter (Signed)
Spoke with patient, patient states that the pharmacy may have her prescription now and she is going to go in the pharmacy and check. Advised patient if they do not have the refill to reach back out to the office. Patient verbalized understanding.

## 2019-11-24 NOTE — Telephone Encounter (Signed)
*  STAT* If patient is at the pharmacy, call can be transferred to refill team.   1. Which medications need to be refilled? (please list name of each medication and dose if known) diltiazem (CARDIZEM) 60 MG tablet  2. Which pharmacy/location (including street and city if local pharmacy) is medication to be sent to? [place], [place].  3. Do they need a 30 day or 90 day supply? [place]

## 2019-11-24 NOTE — Telephone Encounter (Signed)
Attempted to call patient, left message for patient to call back to office.   

## 2019-11-25 ENCOUNTER — Encounter: Payer: Self-pay | Admitting: Cardiovascular Disease

## 2019-12-12 ENCOUNTER — Other Ambulatory Visit: Payer: Self-pay | Admitting: Cardiovascular Disease

## 2019-12-13 ENCOUNTER — Other Ambulatory Visit: Payer: Self-pay | Admitting: Cardiovascular Disease

## 2019-12-13 MED FILL — CLOPIDOGREL 75 MG TABLET: 75 | 90 days supply | Qty: 90 | Fill #0

## 2019-12-19 ENCOUNTER — Other Ambulatory Visit: Payer: Self-pay | Admitting: Cardiovascular Disease

## 2019-12-19 ENCOUNTER — Other Ambulatory Visit (HOSPITAL_COMMUNITY): Payer: Self-pay | Admitting: Endocrinology

## 2019-12-19 MED FILL — JARDIANCE 25 MG TABLET: 25 | 30 days supply | Qty: 30 | Fill #0

## 2019-12-19 MED FILL — POTASSIUM CHLORIDE CRYS ER: 10 | 30 days supply | Qty: 60 | Fill #0

## 2019-12-19 MED FILL — IRBESARTAN-HCTZ 150-12.5 MG: 150-12.5 | 30 days supply | Qty: 30 | Fill #0

## 2019-12-26 MED FILL — hydrOXYzine HCL 25 MG TABS: 25 | 30 days supply | Qty: 30 | Fill #5

## 2020-01-01 ENCOUNTER — Other Ambulatory Visit: Payer: Self-pay | Admitting: Cardiovascular Disease

## 2020-01-01 ENCOUNTER — Other Ambulatory Visit: Payer: Self-pay | Admitting: Cardiology

## 2020-01-01 MED FILL — LEVOTHYROXINE 88 MCG TABLET: 88 | 90 days supply | Qty: 90 | Fill #1

## 2020-01-01 MED FILL — ROSUVASTATIN CALCIUM 40 MG: 40 | 90 days supply | Qty: 90 | Fill #1

## 2020-01-01 MED FILL — BISOPROLOL FUMARATE 5 MG TA: 5 | 90 days supply | Qty: 45 | Fill #0

## 2020-01-01 MED FILL — EZETIMIBE 10 MG TABS: 10 | 90 days supply | Qty: 90 | Fill #0

## 2020-01-01 MED FILL — GLIMEPIRIDE 1 MG TABLET: 1 | 30 days supply | Qty: 90 | Fill #0

## 2020-01-19 MED FILL — IRBESARTAN-HCTZ 150-12.5 MG: 150-12.5 | 30 days supply | Qty: 30 | Fill #1

## 2020-01-19 MED FILL — JARDIANCE 25 MG TABLET: 25 | 30 days supply | Qty: 30 | Fill #1

## 2020-01-31 MED FILL — hydrOXYzine HCL 25 MG TABS: 25 | 30 days supply | Qty: 30 | Fill #6

## 2020-01-31 MED FILL — MONTELUKAST SOD 10 MG TAB: 10 | 90 days supply | Qty: 90 | Fill #1

## 2020-02-08 DIAGNOSIS — E039 Hypothyroidism, unspecified: Secondary | ICD-10-CM | POA: Diagnosis not present

## 2020-02-08 DIAGNOSIS — E7800 Pure hypercholesterolemia, unspecified: Secondary | ICD-10-CM | POA: Diagnosis not present

## 2020-02-08 DIAGNOSIS — E1165 Type 2 diabetes mellitus with hyperglycemia: Secondary | ICD-10-CM | POA: Diagnosis not present

## 2020-02-13 DIAGNOSIS — I10 Essential (primary) hypertension: Secondary | ICD-10-CM | POA: Diagnosis not present

## 2020-02-13 DIAGNOSIS — E039 Hypothyroidism, unspecified: Secondary | ICD-10-CM | POA: Diagnosis not present

## 2020-02-13 DIAGNOSIS — E1165 Type 2 diabetes mellitus with hyperglycemia: Secondary | ICD-10-CM | POA: Diagnosis not present

## 2020-02-13 DIAGNOSIS — E7800 Pure hypercholesterolemia, unspecified: Secondary | ICD-10-CM | POA: Diagnosis not present

## 2020-02-20 MED FILL — JARDIANCE 25 MG TABLET: 25 | 30 days supply | Qty: 30 | Fill #2

## 2020-02-20 MED FILL — IRBESARTAN-HCTZ 150-12.5 MG: 150-12.5 | 30 days supply | Qty: 30 | Fill #2

## 2020-02-20 MED FILL — POTASSIUM CHLORIDE CRYS ER: 10 | 30 days supply | Qty: 60 | Fill #1

## 2020-02-20 MED FILL — dilTIAZem HCL 60 MG TABS: 60 | 90 days supply | Qty: 180 | Fill #1

## 2020-03-01 ENCOUNTER — Other Ambulatory Visit (HOSPITAL_COMMUNITY): Payer: Self-pay | Admitting: Endocrinology

## 2020-03-01 MED FILL — FREESTYLE LITE TEST STRIP: 50 days supply | Qty: 100 | Fill #0

## 2020-03-06 MED FILL — GLIMEPIRIDE 1 MG TABLET: 1 | 30 days supply | Qty: 90 | Fill #1

## 2020-03-06 MED FILL — hydrOXYzine HCL 25 MG TABS: 25 | 30 days supply | Qty: 30 | Fill #7

## 2020-03-25 MED FILL — IRBESARTAN-HCTZ 150-12.5 MG: 150-12.5 | 30 days supply | Qty: 30 | Fill #3

## 2020-03-25 MED FILL — JARDIANCE 25 MG TABLET: 25 | 30 days supply | Qty: 30 | Fill #3

## 2020-04-04 MED FILL — hydrOXYzine HCL 25 MG TABS: 25 | 30 days supply | Qty: 30 | Fill #8

## 2020-04-04 MED FILL — LEVOTHYROXINE 88 MCG TABLET: 88 | 90 days supply | Qty: 90 | Fill #2

## 2020-04-15 ENCOUNTER — Other Ambulatory Visit (HOSPITAL_COMMUNITY): Payer: Self-pay

## 2020-04-15 MED FILL — Bisoprolol Fumarate Tab 5 MG: ORAL | 90 days supply | Qty: 45 | Fill #0 | Status: AC

## 2020-04-15 MED FILL — Rosuvastatin Calcium Tab 40 MG: ORAL | 90 days supply | Qty: 90 | Fill #0 | Status: AC

## 2020-04-15 MED FILL — Ezetimibe Tab 10 MG: ORAL | 90 days supply | Qty: 90 | Fill #0 | Status: AC

## 2020-04-24 ENCOUNTER — Other Ambulatory Visit (HOSPITAL_COMMUNITY): Payer: Self-pay

## 2020-04-24 ENCOUNTER — Other Ambulatory Visit: Payer: Self-pay | Admitting: Cardiovascular Disease

## 2020-04-24 MED ORDER — IRBESARTAN-HYDROCHLOROTHIAZIDE 150-12.5 MG PO TABS
1.0000 | ORAL_TABLET | Freq: Every day | ORAL | 3 refills | Status: DC
  Filled 2020-04-24: qty 30, 30d supply, fill #0
  Filled 2020-05-31: qty 30, 30d supply, fill #1
  Filled 2020-06-28: qty 30, 30d supply, fill #2
  Filled 2020-07-31: qty 30, 30d supply, fill #3

## 2020-04-24 MED FILL — Potassium Chloride Microencapsulated Crys ER Tab 10 mEq: ORAL | 30 days supply | Qty: 60 | Fill #0 | Status: AC

## 2020-04-24 MED FILL — Empagliflozin Tab 25 MG: ORAL | 30 days supply | Qty: 30 | Fill #0 | Status: AC

## 2020-05-01 ENCOUNTER — Other Ambulatory Visit (HOSPITAL_COMMUNITY): Payer: Self-pay

## 2020-05-01 MED FILL — Montelukast Sodium Tab 10 MG (Base Equiv): ORAL | 90 days supply | Qty: 90 | Fill #0 | Status: AC

## 2020-05-07 ENCOUNTER — Other Ambulatory Visit (HOSPITAL_COMMUNITY): Payer: Self-pay

## 2020-05-07 MED FILL — Hydroxyzine HCl Tab 25 MG: ORAL | 30 days supply | Qty: 30 | Fill #0 | Status: AC

## 2020-05-24 ENCOUNTER — Encounter: Payer: Self-pay | Admitting: Cardiovascular Disease

## 2020-05-24 DIAGNOSIS — M25561 Pain in right knee: Secondary | ICD-10-CM | POA: Diagnosis not present

## 2020-05-24 NOTE — Progress Notes (Signed)
Felt dizzy yesterday and fell to the ground, hit her knee. Did not lose consciousness or hit her head. BP was low 95/40. Blood glucose was normal. Recommended stopping the diltiazem, stay well hydrated especially with the warm weather (also on Jardiance, Bystolic irbesartan-HCTZ).

## 2020-05-31 ENCOUNTER — Other Ambulatory Visit (HOSPITAL_COMMUNITY): Payer: Self-pay

## 2020-05-31 MED FILL — Empagliflozin Tab 25 MG: ORAL | 30 days supply | Qty: 30 | Fill #1 | Status: AC

## 2020-06-07 ENCOUNTER — Other Ambulatory Visit (HOSPITAL_COMMUNITY): Payer: Self-pay

## 2020-06-10 ENCOUNTER — Other Ambulatory Visit (HOSPITAL_COMMUNITY): Payer: Self-pay

## 2020-06-10 MED FILL — Glimepiride Tab 1 MG: ORAL | 90 days supply | Qty: 270 | Fill #0 | Status: AC

## 2020-06-11 ENCOUNTER — Other Ambulatory Visit (HOSPITAL_COMMUNITY): Payer: Self-pay

## 2020-06-12 ENCOUNTER — Other Ambulatory Visit (HOSPITAL_COMMUNITY): Payer: Self-pay

## 2020-06-12 MED ORDER — HYDROXYZINE HCL 25 MG PO TABS
25.0000 mg | ORAL_TABLET | Freq: Every day | ORAL | 9 refills | Status: DC | PRN
  Filled 2020-06-12: qty 30, 30d supply, fill #0
  Filled 2020-07-12: qty 30, 30d supply, fill #1
  Filled 2020-08-16: qty 30, 30d supply, fill #2
  Filled 2020-09-16: qty 30, 30d supply, fill #3
  Filled 2020-10-16: qty 30, 30d supply, fill #4
  Filled 2020-11-12: qty 30, 30d supply, fill #5
  Filled 2020-12-13: qty 30, 30d supply, fill #6
  Filled 2021-01-13: qty 30, 30d supply, fill #7
  Filled 2021-02-13: qty 30, 30d supply, fill #8
  Filled 2021-03-17: qty 30, 30d supply, fill #9

## 2020-06-21 ENCOUNTER — Other Ambulatory Visit (HOSPITAL_COMMUNITY): Payer: Self-pay

## 2020-06-21 MED FILL — Potassium Chloride Microencapsulated Crys ER Tab 10 mEq: ORAL | 30 days supply | Qty: 60 | Fill #1 | Status: AC

## 2020-06-21 MED FILL — Clopidogrel Bisulfate Tab 75 MG (Base Equiv): ORAL | 90 days supply | Qty: 90 | Fill #0 | Status: AC

## 2020-06-28 ENCOUNTER — Other Ambulatory Visit (HOSPITAL_COMMUNITY): Payer: Self-pay

## 2020-06-28 MED FILL — Empagliflozin Tab 25 MG: ORAL | 30 days supply | Qty: 30 | Fill #2 | Status: AC

## 2020-07-05 ENCOUNTER — Other Ambulatory Visit (HOSPITAL_COMMUNITY): Payer: Self-pay

## 2020-07-05 MED ORDER — LEVOTHYROXINE SODIUM 88 MCG PO TABS
88.0000 ug | ORAL_TABLET | Freq: Every day | ORAL | 2 refills | Status: DC
  Filled 2020-07-05: qty 90, 90d supply, fill #0
  Filled 2020-09-27: qty 90, 90d supply, fill #1
  Filled 2020-12-30: qty 90, 90d supply, fill #2

## 2020-07-12 ENCOUNTER — Other Ambulatory Visit (HOSPITAL_COMMUNITY): Payer: Self-pay

## 2020-07-12 MED FILL — Ezetimibe Tab 10 MG: ORAL | 90 days supply | Qty: 90 | Fill #1 | Status: AC

## 2020-07-17 ENCOUNTER — Other Ambulatory Visit (HOSPITAL_COMMUNITY): Payer: Self-pay

## 2020-07-17 MED FILL — Bisoprolol Fumarate Tab 5 MG: ORAL | 90 days supply | Qty: 45 | Fill #1 | Status: AC

## 2020-07-18 ENCOUNTER — Other Ambulatory Visit (HOSPITAL_COMMUNITY): Payer: Self-pay

## 2020-07-19 ENCOUNTER — Other Ambulatory Visit (HOSPITAL_COMMUNITY): Payer: Self-pay

## 2020-07-22 ENCOUNTER — Other Ambulatory Visit (HOSPITAL_COMMUNITY): Payer: Self-pay

## 2020-07-22 MED ORDER — ROSUVASTATIN CALCIUM 40 MG PO TABS
40.0000 mg | ORAL_TABLET | Freq: Every evening | ORAL | 2 refills | Status: DC | PRN
  Filled 2020-07-22: qty 90, 90d supply, fill #0
  Filled 2020-11-01: qty 90, 90d supply, fill #1
  Filled 2021-01-31: qty 90, 90d supply, fill #2

## 2020-07-24 ENCOUNTER — Other Ambulatory Visit (HOSPITAL_COMMUNITY): Payer: Self-pay

## 2020-07-31 ENCOUNTER — Other Ambulatory Visit (HOSPITAL_COMMUNITY): Payer: Self-pay

## 2020-07-31 MED ORDER — JARDIANCE 25 MG PO TABS
25.0000 mg | ORAL_TABLET | Freq: Every day | ORAL | 1 refills | Status: DC
  Filled 2020-07-31: qty 30, 30d supply, fill #0
  Filled 2020-09-06: qty 30, 30d supply, fill #1

## 2020-07-31 MED FILL — Montelukast Sodium Tab 10 MG (Base Equiv): ORAL | 90 days supply | Qty: 90 | Fill #1 | Status: AC

## 2020-08-07 ENCOUNTER — Other Ambulatory Visit (HOSPITAL_COMMUNITY): Payer: Self-pay

## 2020-08-07 MED ORDER — CARESTART COVID-19 HOME TEST VI KIT
PACK | 0 refills | Status: DC
  Filled 2020-08-07: qty 4, 4d supply, fill #0

## 2020-08-09 ENCOUNTER — Encounter: Payer: Self-pay | Admitting: Nurse Practitioner

## 2020-08-09 ENCOUNTER — Telehealth: Payer: 59 | Admitting: Nurse Practitioner

## 2020-08-09 DIAGNOSIS — U071 COVID-19: Secondary | ICD-10-CM

## 2020-08-09 MED ORDER — MOLNUPIRAVIR EUA 200MG CAPSULE: 4.0000 | ORAL_CAPSULE | Freq: Two times a day (BID) | ORAL | 0 refills | Status: AC

## 2020-08-09 NOTE — Progress Notes (Signed)
Virtual Visit Consent   Shelley [name], you are scheduled for a virtual visit with a [facility] provider today.     Just as with appointments in the office, your consent must be obtained to participate.  Your consent will be active for this visit and any virtual visit you may have with one of our providers in the next 365 days.     If you have a MyChart account, a copy of this consent can be sent to you electronically.  All virtual visits are billed to your insurance company just like a traditional visit in the office.    As this is a virtual visit, video technology does not allow for your provider to perform a traditional examination.  This may limit your provider's ability to fully assess your condition.  If your provider identifies any concerns that need to be evaluated in person or the need to arrange testing (such as labs, EKG, etc.), we will make arrangements to do so.     Although advances in technology are sophisticated, we cannot ensure that it will always work on either your end or our end.  If the connection with a video visit is poor, the visit may have to be switched to a telephone visit.  With either a video or telephone visit, we are not always able to ensure that we have a secure connection.     I need to obtain your verbal consent now.   Are you willing to proceed with your visit today?    Shelley [name] has provided verbal consent on 08/09/2020 for a virtual visit (video or telephone).   [name], FNP   Date: 08/09/2020 6:51 PM   Virtual Visit via Video Note   I, [name], connected with  Shelley [name]  ([id number], 65/23/1957) on 08/09/20 at  7:00 PM EDT by a video-enabled telemedicine application and verified that I am speaking with the correct person using two identifiers.  Location: Patient: Virtual Visit Location Patient: Home Provider: Virtual Visit Location Provider: Office/Clinic   I discussed the limitations of evaluation and management  by telemedicine and the availability of in person appointments. The patient expressed understanding and agreed to proceed.    History of Present Illness: Shelley [name] is a 65 y.o. who identifies as a female who was assigned female at birth, and is being seen today after testing positive for COVID-19. She reported being a close contact on Monday to Health at work and went for testing today as recommended and was positive.   She has previously tested last Sunday and Wednesday (two days prior) and was negative.   She has had some nasal congestion since yesterday 08/09/2020 Today she has mostly nasal congestion and a mild cough   She has not had COVID infection in the past  She works at [employer]   She has currently had 3 vaccines of [place]   She is a former smoker, quit in 2008.  She has had an MI in the past.   She does have prescription inhalers "as needed' and has not used in over a year because she has not felt that she has needed them. 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   COPD mixed type ([place]) 06/23/2017   Allergic rhinitis 06/23/2017   Hypokalemia 08/03/2015   Status post coronary artery stent placement    Coronary artery disease involving  native coronary artery    ST elevation myocardial infarction (STEMI) of inferior wall (HCC) 07/31/2015   ST elevation myocardial infarction involving right coronary artery (HCC)    DES exposure in utero    CAD S/P percutaneous coronary angioplasty 08/30/2012   HTN (hypertension) 08/30/2012   Obesity (BMI 30.0-34.9) 08/30/2012   Dyslipidemia, goal LDL below 70 08/30/2012   Non-insulin treated type 2 diabetes mellitus ([place]) 03/14/2012    Allergies:  Allergies  Allergen Reactions   Coreg [Carvedilol] Shortness Of Breath   Levothyroxine Itching    Patient thinks it was the dye in the tablets she was allergic to, she feels like "something  is crawling under my skin"   Lopressor [Metoprolol Tartrate] Cough    Fatigue    Metformin And Related Diarrhea   Norvasc [Amlodipine Besylate] Palpitations    "made my heart race"   Medications:  Current Outpatient Medications:    ACCU-CHEK FASTCLIX LANCETS MISC, , Disp: , Rfl: 3   ACCU-CHEK GUIDE test strip, , Disp: , Rfl: 3   albuterol (VENTOLIN HFA) 108 (90 Base) MCG/ACT inhaler, Inhale 2 puffs into the lungs every 6 (six) hours as needed for wheezing or shortness of breath., Disp: 1 Inhaler, Rfl: 5   ASPIRIN ADULT LOW STRENGTH 81 MG EC tablet, TAKE 1 TABLET BY MOUTH ONCE DAILY, Disp: 30 tablet, Rfl: 3   bisoprolol (ZEBETA) 5 MG tablet, TAKE 1/2 TABLET BY MOUTH DAILY. NEEDS OFFICE VISIT., Disp: 45 tablet, Rfl: 2   Budeson-Glycopyrrol-Formoterol (BREZTRI AEROSPHERE) 160-9-4.8 MCG/ACT AERO, Inhale 2 puffs into the lungs in the morning and at bedtime., Disp: 10.7 g, Rfl: 0   Budeson-Glycopyrrol-Formoterol (BREZTRI AEROSPHERE) 160-9-4.8 MCG/ACT AERO, Inhale 2 puffs into the lungs 2 (two) times daily., Disp: 1 g, Rfl: 5   budesonide-formoterol (SYMBICORT) 160-4.5 MCG/ACT inhaler, Symbicort 160 mcg-4.5 mcg/actuation HFA aerosol inhaler  INHALE 2 PUFFS INTO THE LUNGS 2 (TWO) TIMES DAILY., Disp: , Rfl:    clopidogrel (PLAVIX) 75 MG tablet, TAKE 1 TABLET BY MOUTH DAILY., Disp: 90 tablet, Rfl: 3   COVID-19 At Home Antigen Test (CARESTART COVID-19 HOME TEST) KIT, Use as directed, Disp: 4 each, Rfl: 0   diclofenac (VOLTAREN) 75 MG EC tablet, diclofenac sodium 75 mg tablet,delayed release, Disp: , Rfl:    diltiazem (CARDIZEM) 120 MG tablet, diltiazem 120 mg tablet  TAKE 1 TABLET (120 MG TOTAL) BY MOUTH TWICE A DAY, Disp: , Rfl:    doxepin (SINEQUAN) 10 MG capsule, Take 10 mg by mouth at bedtime as needed (sleep)., Disp: , Rfl:    empagliflozin (JARDIANCE) 25 MG TABS tablet, Take 1 tablet (25 mg total) by mouth daily., Disp: 30 tablet, Rfl: 1   ezetimibe (ZETIA) 10 MG tablet, TAKE 1 TABLET BY MOUTH  ONCE DAILY., Disp: 90 tablet, Rfl: 3   fexofenadine (ALLEGRA) 180 MG tablet, Take 180 mg by mouth daily. , Disp: , Rfl:    fluticasone furoate-vilanterol (BREO ELLIPTA) 100-25 MCG/INH AEPB, Inhale 1 puff into the lungs daily., Disp: 1 each, Rfl: 0   glimepiride (AMARYL) 1 MG tablet, Take 3 mg by mouth in the morning and at bedtime. , Disp: , Rfl:    glimepiride (AMARYL) 1 MG tablet, TAKE 3 TABLETS BY MOUTH ONCE A DAY, Disp: 90 tablet, Rfl: 5   glimepiride (AMARYL) 1 MG tablet, TAKE 3 TABLETS BY MOUTH ONCE A DAY, Disp: 90 tablet, Rfl: 5   glucose blood test strip, USE 1 STRIP TO CHECK BLOOD GLUCOSE TWICE DAILY., Disp: 100 strip, Rfl: 4  hydrOXYzine (ATARAX/VISTARIL) 25 MG tablet, Take 25 mg by mouth 3 (three) times daily as needed (allergies)., Disp: , Rfl:    hydrOXYzine (ATARAX/VISTARIL) 25 MG tablet, Take 1 tablet (25 mg total) by mouth daily as needed for itching., Disp: 30 tablet, Rfl: 9   irbesartan-hydrochlorothiazide (AVALIDE) 150-12.5 MG tablet, TAKE 1 TABLET BY MOUTH DAILY., Disp: 30 tablet, Rfl: 3   JARDIANCE 25 MG TABS tablet, Take 25 mg by mouth daily., Disp: , Rfl:    levothyroxine (SYNTHROID) 88 MCG tablet, Take 1 tablet (88 mcg total) by mouth daily., Disp: 90 tablet, Rfl: 2   levothyroxine (SYNTHROID, LEVOTHROID) 88 MCG tablet, Take 88 mcg by mouth daily before breakfast. , Disp: , Rfl: 4   methocarbamol (ROBAXIN) 500 MG tablet, Take 1 tablet (500 mg total) by mouth every 6 (six) hours as needed for muscle spasms., Disp: 40 tablet, Rfl: 0   montelukast (SINGULAIR) 10 MG tablet, TAKE 1 TABLET (10 MG TOTAL) BY MOUTH AT BEDTIME., Disp: 90 tablet, Rfl: 3   potassium chloride (KLOR-CON) 10 MEQ tablet, TAKE 2 TABLETS (20 MEQ TOTAL) BY MOUTH DAILY., Disp: 60 tablet, Rfl: 9   rosuvastatin (CRESTOR) 40 MG tablet, Take 40 mg by mouth daily. , Disp: , Rfl: 3   rosuvastatin (CRESTOR) 40 MG tablet, TAKE 1 TABLET BY MOUTH ONCE A DAY AT BEDTIME FOR CHOLESTEROL, Disp: 90 tablet, Rfl: 2    rosuvastatin (CRESTOR) 40 MG tablet, Take 1 tablet (40 mg total) by mouth at bedtime for cholesterol, Disp: 90 tablet, Rfl: 2  Observations/Objective: Patient is well-developed, well-nourished in no acute distress.  Resting comfortably at home.  Head is normocephalic, atraumatic.  No labored breathing.  Speech is clear and coherent with logical content.  Patient is alert and oriented at baseline.    Assessment and Plan: 1. COVID-19  - molnupiravir EUA 200 mg CAPS; Take 4 capsules (800 mg total) by mouth 2 (two) times daily for 5 days.  Dispense: 40 capsule; Refill: 0    Discussed mediation with patient, advised inhaler use as directed.  OTC Coricidin  for congestion as needed as directed by package   Advised messaging care team to update on status   Follow Up Instructions: I discussed the assessment and treatment plan with the patient. The patient was provided an opportunity to ask questions and all were answered. The patient agreed with the plan and demonstrated an understanding of the instructions.  A copy of instructions were sent to the patient via MyChart.  The patient was advised to call back or seek an in-person evaluation if the symptoms worsen or if the condition fails to improve as anticipated.  Time:  I spent 20 minutes with the patient via telehealth technology discussing the above problems/concerns.    Meds ordered this encounter  Medications   molnupiravir EUA 200 mg CAPS    Sig: Take 4 capsules (800 mg total) by mouth 2 (two) times daily for 5 days.    Dispense:  40 capsule    Refill:  0     [name], FNP

## 2020-08-09 NOTE — Patient Instructions (Signed)
You are being prescribed MOLNUPIRAVIR for COVID-19 infection.  ° ° °Please call the pharmacy or go through the drive through vs going inside if you are picking up the mediation yourself to prevent further spread. If prescribed to a [facility] affiliated pharmacy, a pharmacist will bring the medication out to your car. ° ° °ADMINISTRATION INSTRUCTIONS: °Take with or without food. Swallow the tablets whole. Don't chew, crush, or break the medications because it might not work as well ° °For each dose of the medication, you should be taking FOUR tablets at one time, TWICE a day  ° °Finish your full five-day course of Molnupiravir even if you feel better before you're done. Stopping this medication too early can make it less effective to prevent severe illness related to COVID19.   ° °Molnupiravir is prescribed for YOU ONLY. Don't share it with others, even if they have similar symptoms as you. This medication might not be right for everyone.  ° °Make sure to take steps to protect yourself and others while you're taking this medication in order to get well soon and to prevent others from getting sick with COVID-19. ° ° °**If you are of childbearing potential (any gender) - it is advised to not get pregnant while taking this medication and recommended that condoms are used for female partners the next 3 months after taking the medication out of extreme caution  ° ° °COMMON SIDE EFFECTS: °Diarrhea °Nausea  °Dizziness ° ° ° °If your COVID-19 symptoms get worse, get medical help right away. Call 911 if you experience symptoms such as worsening cough, trouble breathing, chest pain that doesn't go away, confusion, a hard time staying awake, and pale or blue-colored skin. °This medication won't prevent all COVID-19 cases from getting worse.  ° ° °

## 2020-08-09 NOTE — Progress Notes (Signed)
Shelley [name],   There are several options for COVID management now.  If you would like to be considered for a prescription anti-viral medication you will need to either schedule a video visit, or reach out to your primary care provider.   If you are not interested in prescription antiviral medication you can manage your symptoms with over the counter medications that treat your symptoms.   Since your COVID-19 symptoms started less than 5 days ago you may need a prescription for FDA-approved treatments (pills or IV therapy), which we cannot prescribe through an e-Visit. The best way for our providers to make a decision about which COVID treatment is right for you is through a Virtual Urgent Care Visit.  If you would like to discuss COVID therapy options with a provider, cancel this e-Visit and access a Virtual Urgent Care Visit from our [facility] menu.   You will not be charged for the e-Visit.    If you are not interested in the anti-virals and would just like additional advice please let us know and we can discuss isolation times and over the counter medications that can be helpful to manage symptoms.   Thank you Shelley Schneiders FNP-C   I spent approximately 7 minutes reviewing the patient's history, current symptoms and coordinating their plan of care today.

## 2020-08-14 DIAGNOSIS — Z7984 Long term (current) use of oral hypoglycemic drugs: Secondary | ICD-10-CM | POA: Diagnosis not present

## 2020-08-14 DIAGNOSIS — I2510 Atherosclerotic heart disease of native coronary artery without angina pectoris: Secondary | ICD-10-CM | POA: Diagnosis not present

## 2020-08-14 DIAGNOSIS — E119 Type 2 diabetes mellitus without complications: Secondary | ICD-10-CM | POA: Diagnosis not present

## 2020-08-14 DIAGNOSIS — Z79899 Other long term (current) drug therapy: Secondary | ICD-10-CM | POA: Diagnosis not present

## 2020-08-14 DIAGNOSIS — Z0001 Encounter for general adult medical examination with abnormal findings: Secondary | ICD-10-CM | POA: Diagnosis not present

## 2020-08-14 DIAGNOSIS — J4520 Mild intermittent asthma, uncomplicated: Secondary | ICD-10-CM | POA: Diagnosis not present

## 2020-08-14 DIAGNOSIS — G4700 Insomnia, unspecified: Secondary | ICD-10-CM | POA: Diagnosis not present

## 2020-08-14 DIAGNOSIS — I10 Essential (primary) hypertension: Secondary | ICD-10-CM | POA: Diagnosis not present

## 2020-08-14 DIAGNOSIS — E785 Hyperlipidemia, unspecified: Secondary | ICD-10-CM | POA: Diagnosis not present

## 2020-08-16 ENCOUNTER — Other Ambulatory Visit (HOSPITAL_COMMUNITY): Payer: Self-pay

## 2020-08-30 ENCOUNTER — Other Ambulatory Visit (HOSPITAL_COMMUNITY): Payer: Self-pay

## 2020-08-30 MED FILL — Potassium Chloride Microencapsulated Crys ER Tab 10 mEq: ORAL | 30 days supply | Qty: 60 | Fill #2 | Status: AC

## 2020-09-04 DIAGNOSIS — E668 Other obesity: Secondary | ICD-10-CM | POA: Diagnosis not present

## 2020-09-04 DIAGNOSIS — I10 Essential (primary) hypertension: Secondary | ICD-10-CM | POA: Diagnosis not present

## 2020-09-04 DIAGNOSIS — E119 Type 2 diabetes mellitus without complications: Secondary | ICD-10-CM | POA: Diagnosis not present

## 2020-09-04 DIAGNOSIS — E039 Hypothyroidism, unspecified: Secondary | ICD-10-CM | POA: Diagnosis not present

## 2020-09-04 DIAGNOSIS — Z79899 Other long term (current) drug therapy: Secondary | ICD-10-CM | POA: Diagnosis not present

## 2020-09-04 DIAGNOSIS — E785 Hyperlipidemia, unspecified: Secondary | ICD-10-CM | POA: Diagnosis not present

## 2020-09-04 DIAGNOSIS — Z130 Encounter for screening for diseases of the blood and blood-forming organs and certain disorders involving the immune mechanism: Secondary | ICD-10-CM | POA: Diagnosis not present

## 2020-09-04 DIAGNOSIS — Z6831 Body mass index (BMI) 31.0-31.9, adult: Secondary | ICD-10-CM | POA: Diagnosis not present

## 2020-09-04 DIAGNOSIS — Z7984 Long term (current) use of oral hypoglycemic drugs: Secondary | ICD-10-CM | POA: Diagnosis not present

## 2020-09-06 ENCOUNTER — Other Ambulatory Visit (HOSPITAL_COMMUNITY): Payer: Self-pay

## 2020-09-06 ENCOUNTER — Other Ambulatory Visit: Payer: Self-pay | Admitting: Cardiovascular Disease

## 2020-09-06 MED ORDER — IRBESARTAN-HYDROCHLOROTHIAZIDE 150-12.5 MG PO TABS
1.0000 | ORAL_TABLET | Freq: Every day | ORAL | 3 refills | Status: DC
Start: 2020-09-06 — End: 2020-12-30
  Filled 2020-09-06: qty 30, 30d supply, fill #0
  Filled 2020-09-27: qty 90, 90d supply, fill #1

## 2020-09-10 DIAGNOSIS — E785 Hyperlipidemia, unspecified: Secondary | ICD-10-CM | POA: Diagnosis not present

## 2020-09-10 DIAGNOSIS — I10 Essential (primary) hypertension: Secondary | ICD-10-CM | POA: Diagnosis not present

## 2020-09-10 DIAGNOSIS — E039 Hypothyroidism, unspecified: Secondary | ICD-10-CM | POA: Diagnosis not present

## 2020-09-10 DIAGNOSIS — Z130 Encounter for screening for diseases of the blood and blood-forming organs and certain disorders involving the immune mechanism: Secondary | ICD-10-CM | POA: Diagnosis not present

## 2020-09-10 DIAGNOSIS — Z79899 Other long term (current) drug therapy: Secondary | ICD-10-CM | POA: Diagnosis not present

## 2020-09-10 DIAGNOSIS — E119 Type 2 diabetes mellitus without complications: Secondary | ICD-10-CM | POA: Diagnosis not present

## 2020-09-16 ENCOUNTER — Other Ambulatory Visit (HOSPITAL_COMMUNITY): Payer: Self-pay

## 2020-09-19 IMAGING — DX DG CHEST 2V
2 series · 2 of 2 positions shown · non-contrast
Comparison: June 02, 2017

CLINICAL DATA: Chest congestion.

EXAM:
CHEST - 2 VIEW

[chest pa]
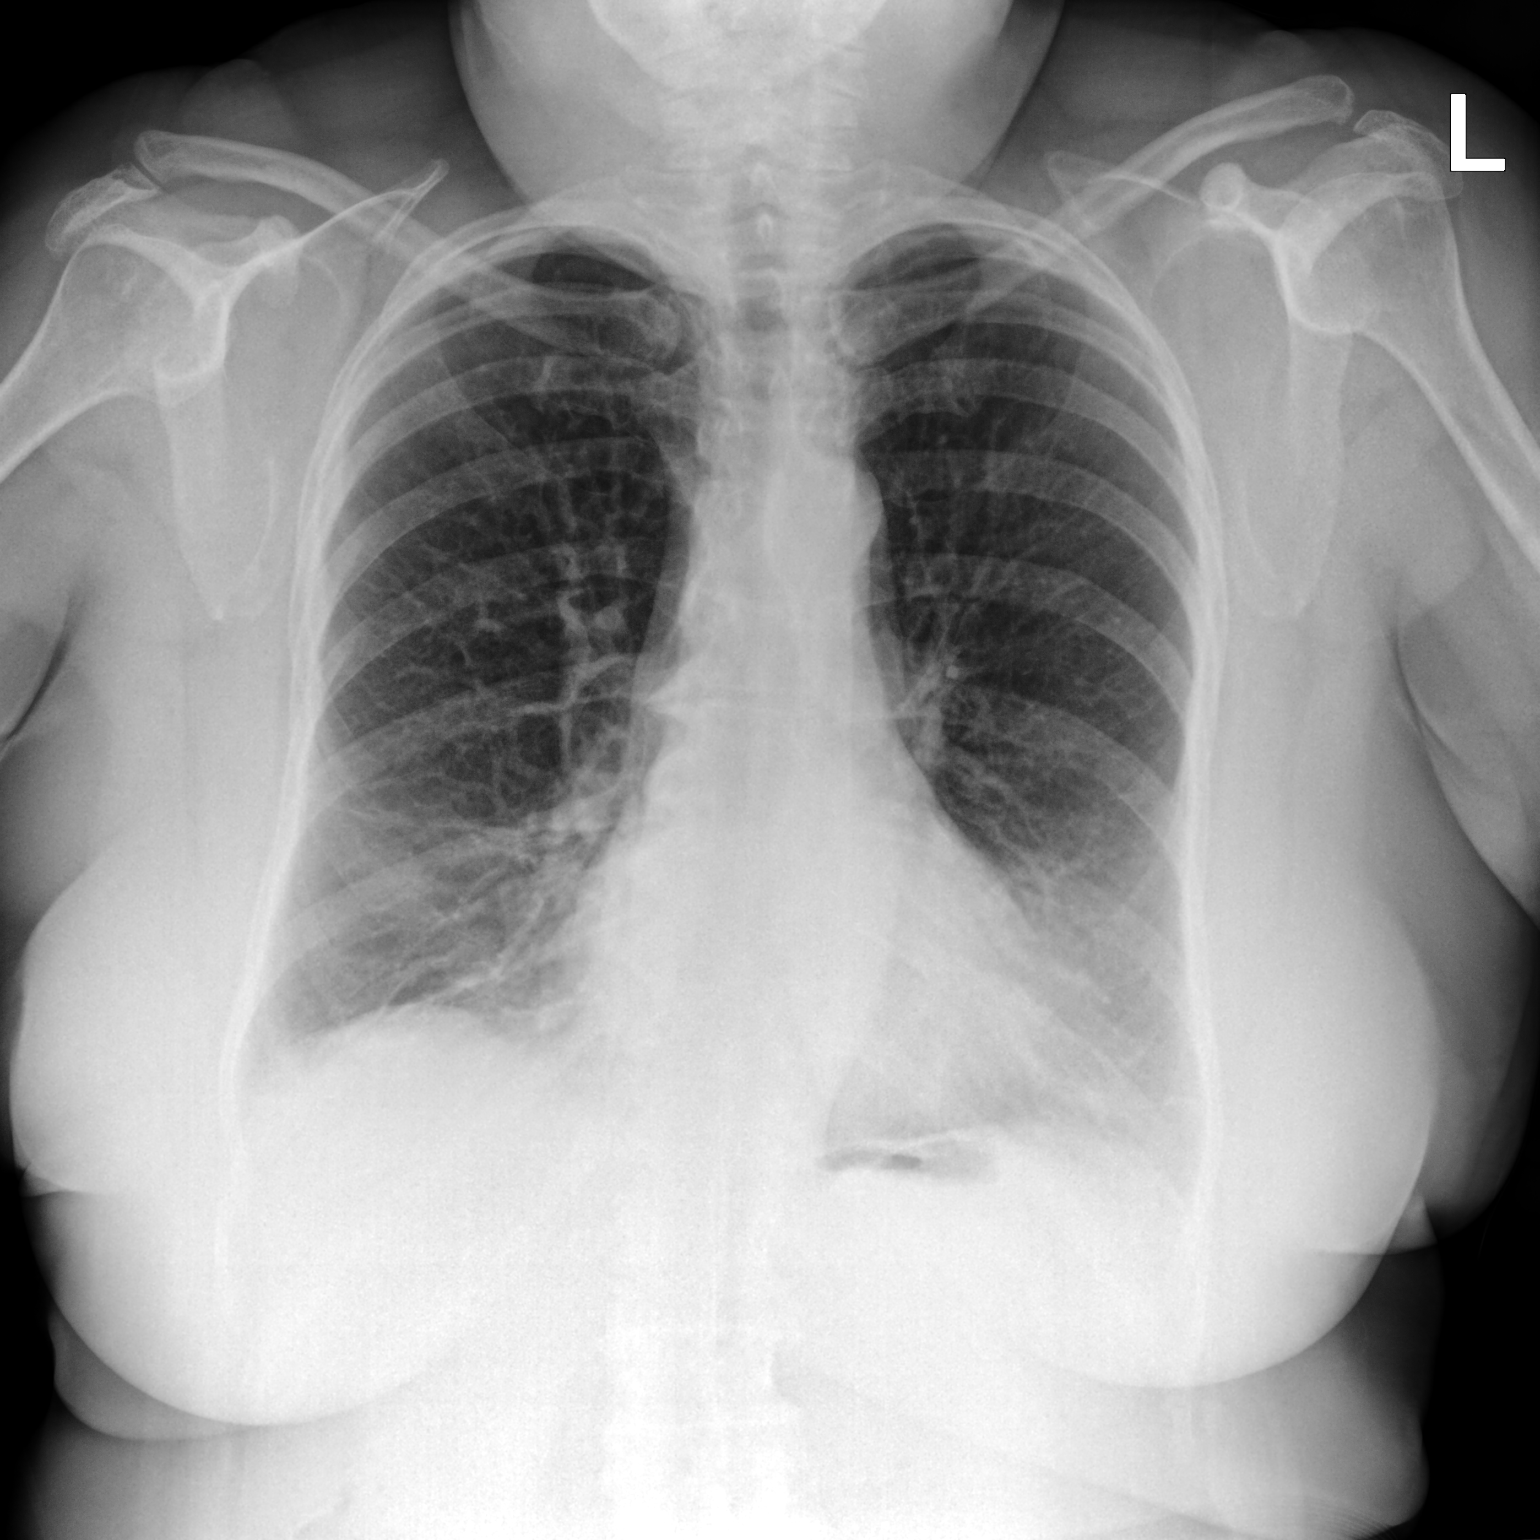

[chest lat]
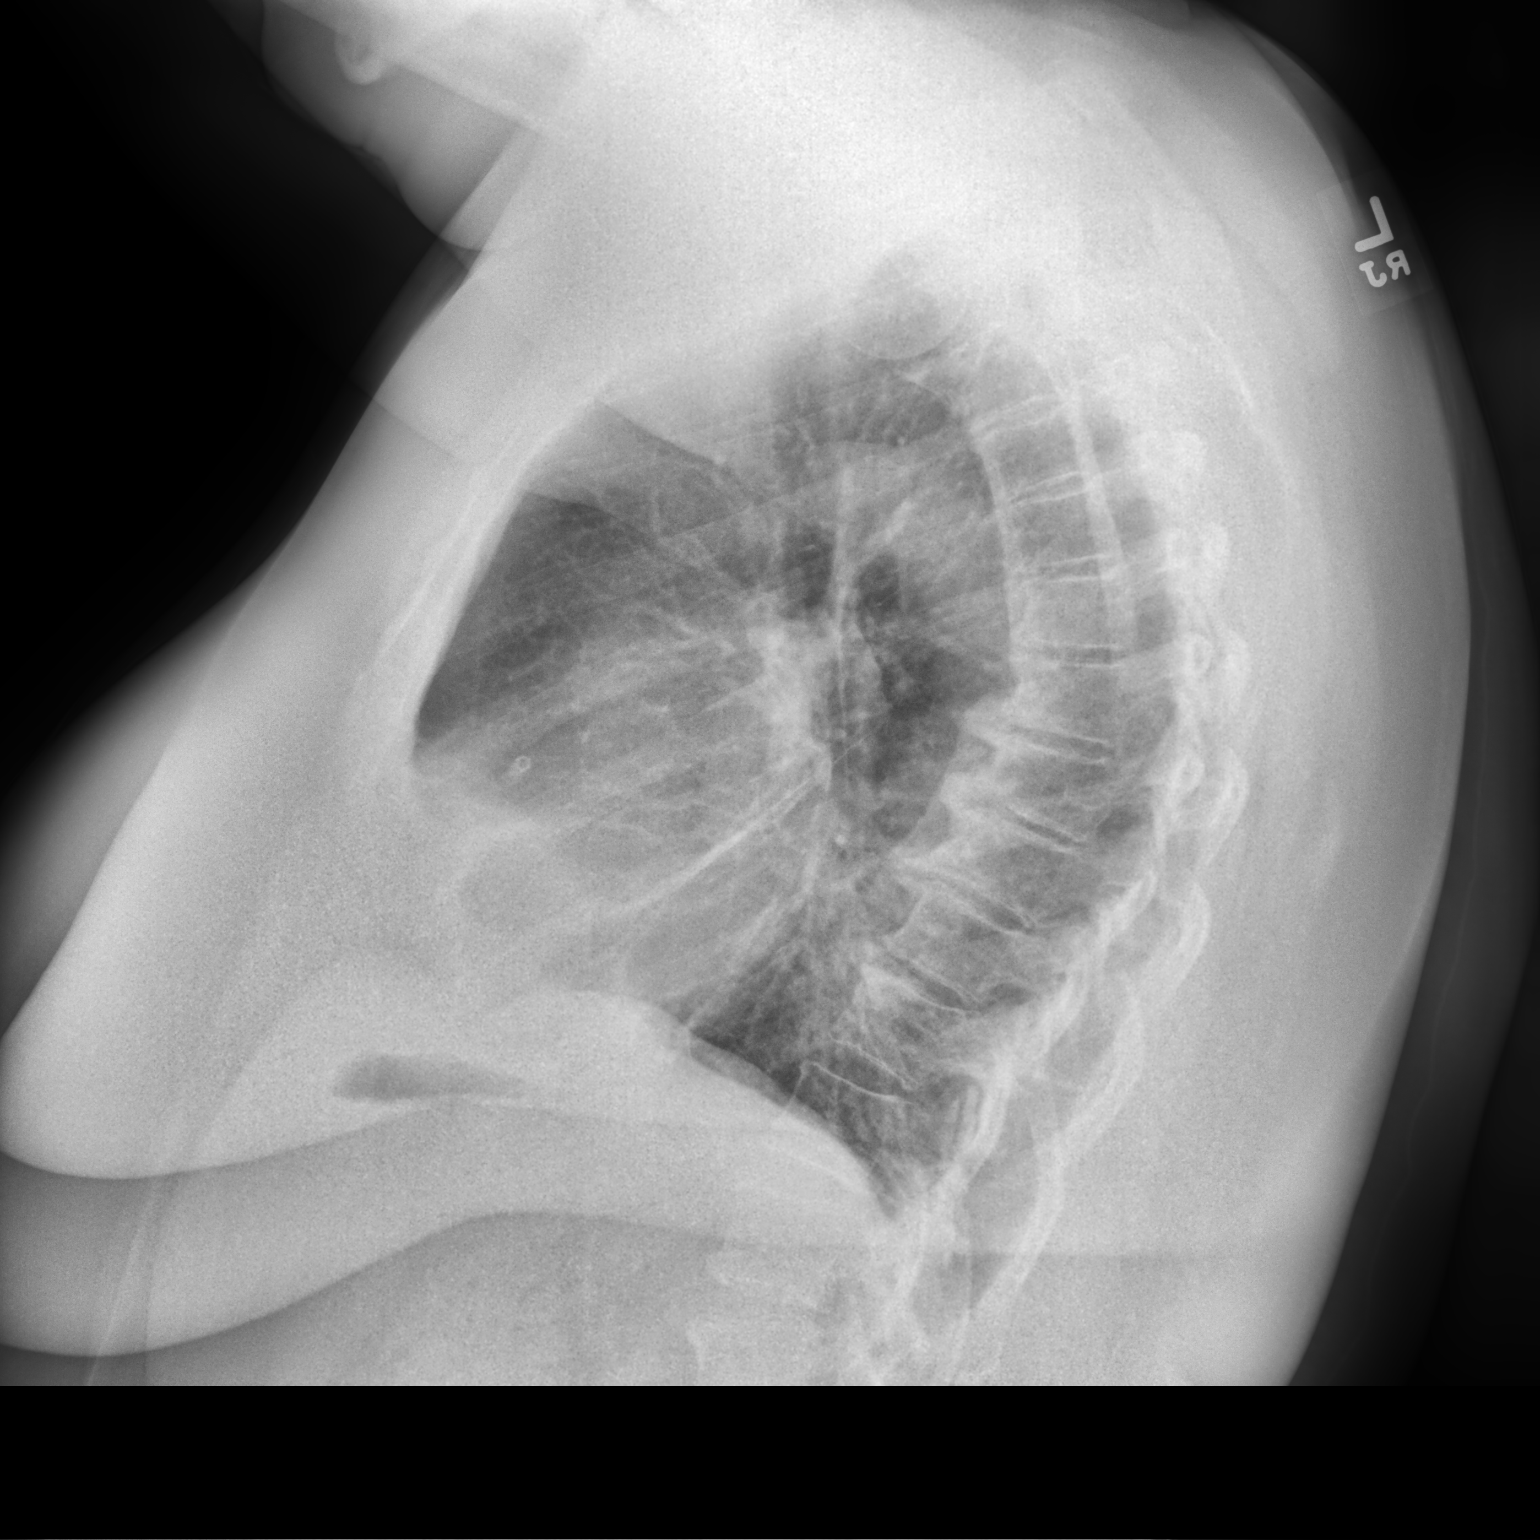

[2 of 2 positions shown; findings below may reference images not displayed]

FINDINGS: Mild, stable areas of atelectasis are seen within the bilateral lung
bases. There is no evidence of a pleural effusion or pneumothorax.
The heart size and mediastinal contours are within normal limits.
Degenerative changes are seen within the mid to lower thoracic
spine.
IMPRESSION: Mild bibasilar atelectasis.

## 2020-09-27 ENCOUNTER — Other Ambulatory Visit (HOSPITAL_COMMUNITY): Payer: Self-pay

## 2020-09-27 ENCOUNTER — Other Ambulatory Visit: Payer: Self-pay

## 2020-09-27 MED ORDER — JARDIANCE 25 MG PO TABS
25.0000 mg | ORAL_TABLET | Freq: Every day | ORAL | 11 refills | Status: DC
  Filled 2020-09-27: qty 30, 30d supply, fill #0
  Filled 2020-09-30: qty 90, 90d supply, fill #0
  Filled 2020-12-30: qty 90, 90d supply, fill #1
  Filled 2021-04-07: qty 90, 90d supply, fill #2
  Filled 2021-07-10: qty 90, 90d supply, fill #3

## 2020-09-27 MED FILL — Potassium Chloride Microencapsulated Crys ER Tab 10 mEq: ORAL | 90 days supply | Qty: 180 | Fill #3 | Status: AC

## 2020-09-27 MED FILL — Ezetimibe Tab 10 MG: ORAL | 90 days supply | Qty: 90 | Fill #2 | Status: AC

## 2020-09-27 MED FILL — Clopidogrel Bisulfate Tab 75 MG (Base Equiv): ORAL | 90 days supply | Qty: 90 | Fill #1 | Status: AC

## 2020-09-30 ENCOUNTER — Other Ambulatory Visit (HOSPITAL_COMMUNITY): Payer: Self-pay

## 2020-09-30 MED ORDER — GLIMEPIRIDE 1 MG PO TABS
1.0000 mg | ORAL_TABLET | Freq: Every day | ORAL | 0 refills | Status: DC
  Filled 2020-09-30: qty 30, 30d supply, fill #0

## 2020-10-01 ENCOUNTER — Other Ambulatory Visit (HOSPITAL_COMMUNITY): Payer: Self-pay

## 2020-10-16 ENCOUNTER — Other Ambulatory Visit (HOSPITAL_COMMUNITY): Payer: Self-pay

## 2020-10-22 ENCOUNTER — Other Ambulatory Visit: Payer: Self-pay | Admitting: Cardiology

## 2020-10-22 ENCOUNTER — Other Ambulatory Visit (HOSPITAL_COMMUNITY): Payer: Self-pay

## 2020-10-22 MED ORDER — BISOPROLOL FUMARATE 5 MG PO TABS
2.5000 mg | ORAL_TABLET | Freq: Every day | ORAL | 0 refills | Status: DC
  Filled 2020-10-22: qty 45, 90d supply, fill #0

## 2020-10-31 ENCOUNTER — Telehealth: Payer: Self-pay | Admitting: *Deleted

## 2020-10-31 DIAGNOSIS — Z006 Encounter for examination for normal comparison and control in clinical research program: Secondary | ICD-10-CM

## 2020-10-31 NOTE — Telephone Encounter (Signed)
I called patient about Coordinate-Diabetes Study. We did not have patient sign medical record release form. I will mail it to patient and have her sign and return to me in pre-paid envelope.

## 2020-11-01 ENCOUNTER — Other Ambulatory Visit (HOSPITAL_COMMUNITY): Payer: Self-pay

## 2020-11-12 ENCOUNTER — Other Ambulatory Visit: Payer: Self-pay | Admitting: Pulmonary Disease

## 2020-11-12 ENCOUNTER — Other Ambulatory Visit (HOSPITAL_COMMUNITY): Payer: Self-pay

## 2020-11-12 MED ORDER — MONTELUKAST SODIUM 10 MG PO TABS
10.0000 mg | ORAL_TABLET | Freq: Every day | ORAL | 0 refills | Status: DC
  Filled 2020-11-12: qty 90, 90d supply, fill #0

## 2020-12-12 ENCOUNTER — Ambulatory Visit: Payer: 59 | Admitting: Cardiovascular Disease

## 2020-12-13 ENCOUNTER — Other Ambulatory Visit (HOSPITAL_COMMUNITY): Payer: Self-pay

## 2020-12-30 ENCOUNTER — Other Ambulatory Visit: Payer: Self-pay | Admitting: Cardiovascular Disease

## 2020-12-31 ENCOUNTER — Other Ambulatory Visit (HOSPITAL_COMMUNITY): Payer: Self-pay

## 2020-12-31 MED ORDER — CLOPIDOGREL BISULFATE 75 MG PO TABS
75.0000 mg | ORAL_TABLET | Freq: Every day | ORAL | 3 refills | Status: DC
  Filled 2020-12-31: qty 90, 90d supply, fill #0
  Filled 2021-03-31: qty 90, 90d supply, fill #1
  Filled 2021-06-26: qty 90, 90d supply, fill #2
  Filled 2021-09-30: qty 90, 90d supply, fill #3

## 2020-12-31 MED ORDER — IRBESARTAN-HYDROCHLOROTHIAZIDE 150-12.5 MG PO TABS
1.0000 | ORAL_TABLET | Freq: Every day | ORAL | 3 refills | Status: DC
  Filled 2020-12-31: qty 30, 30d supply, fill #0
  Filled 2021-02-06: qty 30, 30d supply, fill #1
  Filled 2021-02-06: qty 90, 90d supply, fill #1

## 2021-01-13 ENCOUNTER — Other Ambulatory Visit (HOSPITAL_COMMUNITY): Payer: Self-pay

## 2021-01-21 ENCOUNTER — Other Ambulatory Visit (HOSPITAL_COMMUNITY): Payer: Self-pay

## 2021-01-21 ENCOUNTER — Other Ambulatory Visit: Payer: Self-pay | Admitting: Cardiovascular Disease

## 2021-01-21 MED ORDER — BISOPROLOL FUMARATE 5 MG PO TABS
2.5000 mg | ORAL_TABLET | Freq: Every day | ORAL | 0 refills | Status: DC
  Filled 2021-01-21: qty 45, 90d supply, fill #0

## 2021-01-21 MED ORDER — EZETIMIBE 10 MG PO TABS
10.0000 mg | ORAL_TABLET | Freq: Every day | ORAL | 3 refills | Status: DC
  Filled 2021-01-21: qty 90, 90d supply, fill #0
  Filled 2021-04-21: qty 90, 90d supply, fill #1
  Filled 2021-07-21: qty 90, 90d supply, fill #2
  Filled 2021-11-05: qty 90, 90d supply, fill #3

## 2021-01-27 NOTE — Progress Notes (Signed)
Cardiology Office Note:    Date:  02/04/2021   ID:  Shelley [name], DOB 09/08/1955, MRN [id number]  PCP:  [name], MD   [facility] Providers Cardiologist:  Sanda Klein, MD Cardiology APP:  Ledora Bottcher, [place] { Referring MD: [name], MD   Chief Complaint  Patient presents with   Follow-up    CAD    History of Present Illness:    Shelley [name] is a 66 y.o. female with a hx of premature onset of CAD with previously placed stents in the LAD (BMS to LAD, 2001, DES to LAD 2008) and proximal to mid RCA (DES to RCA 2008, DES to RCA 2016).  She does have collaterals from left to right. She presented with acute inferior STEMI secondary to occluded proximal RCA that was treated successfully with DES on 07/31/2015. Stage PCI to distal RCA was performed 2 days later (stents do not overlap). She had shortness of breath on brilinta and is now on plavix.   She is on bisoprolol given reactive airway disease.  HTN controlled on immediate release diltiazem, did not tolerate long acting cardizem (puritis). Continue ARB. She did not tolerate jardiance secondary to yeast infections.   She was last seen in clinic 11/23/19 with Dr. Sallyanne Kuster and was doing well at that time. She bowls competitively. She presents today for scheduled annual follow up. She has no complaints today. She bowls 2 nights per week and can complete 4.0 METS at home. She also walks on the treadmill three times weekly for 45 min without angina.   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    Diabetes mellitus    Dyslipidemia    Hypertension    MI (myocardial infarction) ([place])    Shingles    Thyroid disease     Past Surgical History:  Procedure Laterality Date   BACK SURGERY  5/99 & 6/99   CARDIAC CATHETERIZATION  01/12/2002   stent  patent   CARDIAC CATHETERIZATION  09/03/2006   patent stents   CARDIAC CATHETERIZATION N/A 07/31/2015   Procedure: Left Heart Cath and Coronary Angiography;  Surgeon: Burnell Blanks, MD;  Location: [place] [facility];  Service: Cardiovascular;  Laterality: N/A;   CARDIAC CATHETERIZATION N/A 07/31/2015   Procedure: Coronary Stent Intervention;  Surgeon: Burnell Blanks, MD;  Location: [place] [facility];  Service: Cardiovascular;  Laterality: N/A;   CARDIAC CATHETERIZATION N/A 08/02/2015   Procedure: Coronary Stent Intervention;  Surgeon: Leonie Man, MD;  Location: [place] [facility];  Service: Cardiovascular;  Laterality: N/A;   CORONARY ANGIOPLASTY WITH STENT PLACEMENT  07/17/1998   stent LAD   CORONARY ANGIOPLASTY WITH STENT PLACEMENT  01/07/2006   stent RCA,LAD stent patent   CORONARY ANGIOPLASTY WITH STENT PLACEMENT  03/18/2006   stent to mid RCA,prox.RCA and LAD stent patent   CRYOTHERAPY     THYROID SURGERY     PT. DENIES/   just radiation RADIATION   TOTAL HIP ARTHROPLASTY Right 12/21/2017   Procedure: TOTAL HIP ARTHROPLASTY ANTERIOR APPROACH;  Surgeon: Paralee Cancel, MD;  Location: [facility];  Service: Orthopedics;  Laterality: Right;  70 mins   TUBAL LIGATION      Current Medications: Current Meds  Medication Sig   ACCU-CHEK FASTCLIX LANCETS MISC  ACCU-CHEK GUIDE test strip    albuterol (VENTOLIN HFA) 108 (90 Base) MCG/ACT inhaler Inhale 2 puffs into the lungs every 6 (six) hours as needed for wheezing or shortness of breath.   ASPIRIN ADULT LOW STRENGTH 81 MG EC tablet TAKE 1 TABLET BY MOUTH ONCE DAILY   bisoprolol (ZEBETA) 5 MG tablet Take 0.5 tablets (2.5 mg total) by mouth daily.   budesonide-formoterol (SYMBICORT) 160-4.5 MCG/ACT inhaler Symbicort 160 mcg-4.5 mcg/actuation HFA aerosol inhaler  INHALE 2 PUFFS INTO THE LUNGS 2 (TWO) TIMES DAILY.   clopidogrel (PLAVIX) 75 MG tablet Take 1 tablet (75 mg total) by mouth daily.   COVID-19 At Home Antigen Test  [facility] COVID-19 HOME TEST) KIT Use as directed   diltiazem (CARDIZEM) 120 MG tablet diltiazem 120 mg tablet  TAKE 1 TABLET (120 MG TOTAL) BY MOUTH TWICE A DAY   doxepin (SINEQUAN) 10 MG capsule Take 10 mg by mouth at bedtime as needed (sleep).   empagliflozin (JARDIANCE) 25 MG TABS tablet Take 1 tablet (25 mg total) by mouth daily.   ezetimibe (ZETIA) 10 MG tablet Take 1 tablet (10 mg total) by mouth daily.   fexofenadine (ALLEGRA) 180 MG tablet Take 180 mg by mouth daily.    fluticasone furoate-vilanterol (BREO ELLIPTA) 100-25 MCG/INH AEPB Inhale 1 puff into the lungs daily.   glimepiride (AMARYL) 1 MG tablet Take 1 tablet (1 mg total) by mouth daily with breakfast or the first main meal of the day.   glucose blood test strip USE 1 STRIP TO CHECK BLOOD GLUCOSE TWICE DAILY.   hydrOXYzine (ATARAX) 25 MG tablet Take 1 tablet (25 mg total) by mouth daily as needed for itching.   irbesartan-hydrochlorothiazide (AVALIDE) 150-12.5 MG tablet Take 1 tablet by mouth daily.   levothyroxine (SYNTHROID) 88 MCG tablet Take 1 tablet (88 mcg total) by mouth daily.   levothyroxine (SYNTHROID, LEVOTHROID) 88 MCG tablet Take 88 mcg by mouth daily before breakfast.    montelukast (SINGULAIR) 10 MG tablet Take 1 tablet (10 mg total) by mouth at bedtime.   rosuvastatin (CRESTOR) 40 MG tablet Take 1 tablet (40 mg total) by mouth at bedtime for cholesterol     Allergies:   Coreg [carvedilol], Levothyroxine, Lopressor [metoprolol tartrate], Metformin and related, and Norvasc [amlodipine besylate]   Social History   Socioeconomic History   Marital status: Married    Spouse name: Not on file   Number of children: Not on file   Years of education: Not on file   Highest education level: Not on file  Occupational History   Occupation: EKG Tech    Employer: [employer]  Tobacco Use   Smoking status: Former    Packs/day: 0.50    Years: 34.00    Pack years: 17.00    Types: Cigarettes    Quit date: 2008     Years since quitting: 15.0   Smokeless tobacco: Former    Quit date: 01/05/2005  Vaping Use   Vaping Use: Never used  Substance and Sexual Activity   Alcohol use: No    Alcohol/week: 0.0 standard drinks   Drug use: No   Sexual activity: Yes    Partners: Male    Birth control/protection: Post-menopausal  Other Topics Concern   Not on file  Social History Narrative   Not on file   Social Determinants of Health   Financial Resource Strain: Not on file  Food Insecurity: Not on file  Transportation Needs: Not on file  Physical Activity: Not on file  Stress: Not on file  Social Connections: Not on file     Family History: The patient's family history includes Esophageal cancer in her maternal aunt; Hypertension in her mother; Prostate cancer in her maternal uncle; Thyroid disease in her mother.  ROS:   Please see the history of present illness.     All other systems reviewed and are negative.  EKGs/Labs/Other Studies Reviewed:    The following studies were reviewed today:  Coronary stent intervention 07/2015: Dist RCA lesion, 80 %stenosed. - for planned PCI. A STENT PROMUS PREM MR 3.0X16 drug eluting stent was successfully placed, and does not overlap previously placed stent. Post intervention, there is a 0% residual stenosis. Recent Prox RCA Promus Premier DES stent, 0 %stenosed. Prox RCA to Mid RCA lesion, 10 %stenosed - in-stent restenosis     Successful distal RCA PCI with a single DES stent with excellent result.   Plan: Transferred to 6 Central post procedure unit for ongoing care and TR band removal. Continue dual therapy, would consider a Thienopyridine Antiplatelet agent lifelong. Unfortunately she has intolerances of many cardiac medications such as beta blockers and calcium channel blockers. Defer to primary rounding service and primary cardiologist.   Echo 08/26/2015: Study Conclusions   - Left ventricle: The cavity size was normal. Systolic function was     normal. The estimated ejection fraction was in the range of 60%    to 65%. Wall motion was normal; there were no regional wall    motion abnormalities. Doppler parameters are consistent with    abnormal left ventricular relaxation (grade 1 diastolic    dysfunction).  - Atrial septum: No defect or patent foramen ovale was identified.    EKG:  EKG is  ordered today.  The ekg ordered today demonstrates sinus rhythm HR 67 with TWI precordial leads (old) - unchanged from prior  Recent Labs: No results found for requested labs within last 8760 hours.  Recent Lipid Panel    Component Value Date/Time   CHOL 209 (H) 08/03/2015 0450   TRIG 105 08/03/2015 0450   HDL 32 (L) 08/03/2015 0450   CHOLHDL 6.5 08/03/2015 0450   VLDL 21 08/03/2015 0450   LDLCALC 156 (H) 08/03/2015 0450     Risk Assessment/Calculations:           Physical Exam:    VS:  BP 130/78    Pulse 67    Ht '5\' 5"'  (1.651 m)    Wt 185 lb (83.9 kg)    LMP 01/06/2003 (Approximate)    SpO2 93%    BMI 30.79 kg/m     Wt Readings from Last 3 Encounters:  02/04/21 185 lb (83.9 kg)  11/23/19 203 lb (92.1 kg)  08/25/19 204 lb 9.6 oz (92.8 kg)     GEN:  Well nourished, well developed in no acute distress HEENT: Normal NECK: No JVD; No carotid bruits LYMPHATICS: No lymphadenopathy CARDIAC: RRR, no murmurs, rubs, gallops RESPIRATORY:  Clear to auscultation without rales, wheezing or rhonchi  ABDOMEN: Soft, non-tender, non-distended MUSCULOSKELETAL:  No edema; No deformity  SKIN: Warm and dry NEUROLOGIC:  Alert and oriented x 3 PSYCHIATRIC:  Normal affect   ASSESSMENT:    1. Coronary artery disease involving native coronary artery of native heart without angina pectoris   2. Essential hypertension   3. Dyslipidemia (high LDL; low HDL)   4. Obesity (BMI 30.0-34.9)   5. Non-insulin treated type 2 diabetes mellitus ([place])    PLAN:    In order of  problems listed above:  Premature CAD - prior PCI to RCA and LAD -  lifelong plavix - no bleeding issues   Hypertension - continue low dose bisoprolol and low dose IR cardizem BID - continue ARB given DM - continue HCTZ   Hyperlipidemia  - prefer LDL goal less than 55 if possible - continue 40 mg crestor and zetia Lipid panel 09/2020: Total chol 121 LDL 67 HDL 37 Trig 86   Obesity - she is exercising and is losing some weight   DM - she is tolerating jardiance - A1c now 6.9% - continue amaryl   Follow up with Dr. Sallyanne Kuster in 1 year.      Medication Adjustments/Labs and Tests Ordered: Current medicines are reviewed at length with the patient today.  Concerns regarding medicines are outlined above.  Orders Placed This Encounter  Procedures   EKG 12-Lead   No orders of the defined types were placed in this encounter.   Patient Instructions  Medication Instructions:  Your physician recommends that you continue on your current medications as directed. Please refer to the Current Medication list given to you today.  *If you need a refill on your cardiac medications before your next appointment, please call your pharmacy*  Lab Work: NONE ordered at this time of appointment   If you have labs (blood work) drawn today and your tests are completely normal, you will receive your results only by: [place] (if you have MyChart) OR A paper copy in the mail If you have any lab test that is abnormal or we need to change your treatment, we will call you to review the results.  Testing/Procedures: NONE ordered at this time of appointment   Follow-Up: At [facility], you and your health needs are our priority.  As part of our continuing mission to provide you with exceptional heart care, we have created designated Provider Care Teams.  These Care Teams include your primary Cardiologist (physician) and Advanced Practice Providers (APPs -  Physician Assistants and Nurse Practitioners) who all work together to provide you with the care  you need, when you need it.  Your next appointment:   1 year(s)  The format for your next appointment:   In Person  Provider:   Sanda Klein, MD {  Other Instructions     Signed, Ledora Bottcher, [place]  02/04/2021 9:36 AM    [place]

## 2021-01-31 ENCOUNTER — Other Ambulatory Visit (HOSPITAL_COMMUNITY): Payer: Self-pay

## 2021-02-04 ENCOUNTER — Encounter: Payer: Self-pay | Admitting: Physician Assistant

## 2021-02-04 ENCOUNTER — Ambulatory Visit: Payer: Medicare Other | Admitting: Physician Assistant

## 2021-02-04 ENCOUNTER — Other Ambulatory Visit: Payer: Self-pay

## 2021-02-04 VITALS — BP 130/78 | HR 67 | Ht 65.0 in | Wt 185.0 lb

## 2021-02-04 DIAGNOSIS — E785 Hyperlipidemia, unspecified: Secondary | ICD-10-CM | POA: Diagnosis not present

## 2021-02-04 DIAGNOSIS — E119 Type 2 diabetes mellitus without complications: Secondary | ICD-10-CM

## 2021-02-04 DIAGNOSIS — I2510 Atherosclerotic heart disease of native coronary artery without angina pectoris: Secondary | ICD-10-CM

## 2021-02-04 DIAGNOSIS — E66811 Obesity, class 1: Secondary | ICD-10-CM

## 2021-02-04 DIAGNOSIS — I10 Essential (primary) hypertension: Secondary | ICD-10-CM | POA: Diagnosis not present

## 2021-02-04 DIAGNOSIS — E669 Obesity, unspecified: Secondary | ICD-10-CM

## 2021-02-04 NOTE — Patient Instructions (Signed)
Medication Instructions:  Your physician recommends that you continue on your current medications as directed. Please refer to the Current Medication list given to you today.  *If you need a refill on your cardiac medications before your next appointment, please call your pharmacy*  Lab Work: NONE ordered at this time of appointment   If you have labs (blood work) drawn today and your tests are completely normal, you will receive your results only by: [place] (if you have MyChart) OR A paper copy in the mail If you have any lab test that is abnormal or we need to change your treatment, we will call you to review the results.  Testing/Procedures: NONE ordered at this time of appointment   Follow-Up: At [facility], you and your health needs are our priority.  As part of our continuing mission to provide you with exceptional heart care, we have created designated Provider Care Teams.  These Care Teams include your primary Cardiologist (physician) and Advanced Practice Providers (APPs -  Physician Assistants and Nurse Practitioners) who all work together to provide you with the care you need, when you need it.  Your next appointment:   1 year(s)  The format for your next appointment:   In Person  Provider:   Sanda Klein, MD {  Other Instructions

## 2021-02-06 ENCOUNTER — Other Ambulatory Visit (HOSPITAL_COMMUNITY): Payer: Self-pay

## 2021-02-06 MED ORDER — LEVOTHYROXINE SODIUM 75 MCG PO TABS
75.0000 ug | ORAL_TABLET | Freq: Every morning | ORAL | 4 refills | Status: DC
  Filled 2021-02-06: qty 30, 30d supply, fill #0
  Filled 2021-03-10: qty 30, 30d supply, fill #1
  Filled 2021-04-07: qty 30, 30d supply, fill #2
  Filled 2021-05-05: qty 30, 30d supply, fill #3
  Filled 2021-06-12: qty 30, 30d supply, fill #4

## 2021-02-13 ENCOUNTER — Other Ambulatory Visit (HOSPITAL_COMMUNITY): Payer: Self-pay

## 2021-02-13 ENCOUNTER — Other Ambulatory Visit: Payer: Self-pay | Admitting: Pulmonary Disease

## 2021-02-14 ENCOUNTER — Other Ambulatory Visit: Payer: Self-pay

## 2021-02-14 ENCOUNTER — Other Ambulatory Visit (HOSPITAL_COMMUNITY): Payer: Self-pay

## 2021-02-14 ENCOUNTER — Ambulatory Visit: Payer: Medicare Other | Admitting: Pulmonary Disease

## 2021-02-14 ENCOUNTER — Encounter: Payer: Self-pay | Admitting: Pulmonary Disease

## 2021-02-14 VITALS — BP 118/64 | HR 63 | Temp 98.1°F | Ht 65.0 in | Wt 184.8 lb

## 2021-02-14 DIAGNOSIS — J449 Chronic obstructive pulmonary disease, unspecified: Secondary | ICD-10-CM | POA: Diagnosis not present

## 2021-02-14 DIAGNOSIS — J3089 Other allergic rhinitis: Secondary | ICD-10-CM | POA: Diagnosis not present

## 2021-02-14 MED ORDER — FLUTICASONE PROPIONATE 50 MCG/ACT NA SUSP
1.0000 | Freq: Every day | NASAL | Status: AC | PRN
Start: 2021-02-14 — End: ?

## 2021-02-14 MED ORDER — MONTELUKAST SODIUM 10 MG PO TABS
10.0000 mg | ORAL_TABLET | Freq: Every day | ORAL | 3 refills | Status: DC
  Filled 2021-02-14: qty 90, 90d supply, fill #0
  Filled 2021-05-16: qty 90, 90d supply, fill #1
  Filled 2021-08-12: qty 90, 90d supply, fill #2
  Filled 2021-11-12: qty 90, 90d supply, fill #3

## 2021-02-14 NOTE — Progress Notes (Signed)
[place] [facility]  Chief Complaint  Patient presents with   Follow-up    Pt states she has been doing okay and denies any complaints.    Past Surgical History:  She  has a past surgical history that includes Back surgery (5/99 & 6/99); Coronary angioplasty with stent (07/17/1998); Cardiac catheterization (01/12/2002); Coronary angioplasty with stent (01/07/2006); Coronary angioplasty with stent (03/18/2006); Cardiac catheterization (09/03/2006); Thyroid surgery; Tubal ligation; Cryotherapy; Cardiac catheterization (N/A, 07/31/2015); Cardiac catheterization (N/A, 07/31/2015); Cardiac catheterization (N/A, 08/02/2015); and Total hip arthroplasty (Right, 12/21/2017).  Past Medical History:  Hypothyroidism, Shingles, CAD s/p stent, HTN, DM type 2, HLD  Constitutional:  BP 118/64 (BP Location: Left Arm, Patient Position: Sitting, Cuff Size: Normal)    Pulse 63    Temp 98.1 F (36.7 C) (Oral)    Ht _0  (1.651 m)    Wt 184 lb 12.8 oz (83.8 kg)    LMP 01/06/2003 (Approximate)    SpO2 95% Comment: RA   BMI 30.75 kg/m   Brief Summary:  Shelley [name] is a 66 y.o. 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      Subjective:   I last saw her in August 2020.  More recently seen by Tammy Parrett.  Her breathing has been okay.  Not having cough, wheeze, or sputum.  Uses symbicort two or three times per week.  Hasn't been using albuterol.  Using singulair at night.  Has persistent sinus congestion and runny nose.  Has been using allerga.  Hasn't tried any nose sprays.  Physical Exam:   Appearance - well kempt   ENMT - no sinus tenderness, no oral exudate, no LAN, Mallampati 4 airway, no stridor, wears dentures, boggy nasal mucosa  Respiratory - equal breath sounds bilaterally, no wheezing or rales  CV - s1s2 regular rate and rhythm, no murmurs  Ext - no clubbing, no edema  Skin - no rashes  Psych - normal mood and affect   Pulmonary  testing:  FeNO 06/02/17 >> 59 Spirometry 06/02/17 >> FEV1 1.18 (55%), FEV1% 56 A1AT 08/23/17 >> 129, EM PFT 08/23/17 >> FEV1 1.31 (62%), FEV1% 59, TLC 5.05 (98%), DLCO 43%, no BD  Chest Imaging:  CT angio chest 08/21/15 >> centrilobular emphysema, 5 mm nodule LLL stable since 2014  Cardiac Tests:  Echo 08/26/15 >> EF 60 to 65%, grade 1 DD  Social History:  She  reports that she quit smoking about 15 years ago. Her smoking use included cigarettes. She has a 17.00 pack-year smoking history. She quit smokeless tobacco use about 16 years ago. She reports that she does not drink alcohol and does not use drugs.  Family History:  Her family history includes Esophageal cancer in her maternal aunt; Hypertension in her mother; Prostate cancer in her maternal uncle; Thyroid disease in her mother.     Assessment/Plan:   COPD with asthma and emphysema. - continue singulair 10 mg qhs - prn symbicort  Perennial allergic rhinitis. - advised her to try flonase - continue allegra, singulair  Coronary artery disease. - followed by Dr. Albertine Patricia and Fabian Sharp with Cardiology  Hypothyroidism, Diabetes mellitus type 2. - followed by Dr. Vashti Hey with Endocrinology  Time Spent Involved in Patient Care on Day of Examination:  26 minutes  Follow up:   Patient Instructions  Try using flonase 1 spray in each nostril nightly to help with sinus congestion and runny nose  Follow up in 1 year  Medication List:   Allergies as of 02/14/2021       Reactions   Coreg [carvedilol] Shortness Of Breath   Levothyroxine Itching   Patient thinks it was the dye in the tablets she was allergic to, she feels like "something is crawling under my skin"   Lopressor [metoprolol Tartrate] Cough   Fatigue   Metformin And Related Diarrhea   Norvasc [amlodipine Besylate] Palpitations   "made my heart race"        Medication List        Accurate as of February 14, 2021 11:14 AM. If you have any  questions, ask your nurse or doctor.          STOP taking these medications    albuterol 108 (90 Base) MCG/ACT inhaler Commonly known as: Ventolin HFA Stopped by: Chesley Mires, MD   Breo Ellipta 100-25 MCG/ACT Aepb Generic drug: fluticasone furoate-vilanterol Stopped by: Chesley Mires, MD   Carestart COVID-19 Home Test Kit Generic drug: COVID-19 At Home Antigen Test Stopped by: Chesley Mires, MD   diltiazem 120 MG tablet Commonly known as: CARDIZEM Stopped by: Chesley Mires, MD       TAKE these medications    Accu-Chek FastClix Lancets Misc   Accu-Chek Guide test strip Generic drug: glucose blood   FREESTYLE LITE test strip Generic drug: glucose blood USE 1 STRIP TO CHECK BLOOD GLUCOSE TWICE DAILY.   Aspirin Adult Low Strength 81 MG EC tablet Generic drug: aspirin TAKE 1 TABLET BY MOUTH ONCE DAILY   bisoprolol 5 MG tablet Commonly known as: ZEBETA Take 0.5 tablets (2.5 mg total) by mouth daily.   budesonide-formoterol 160-4.5 MCG/ACT inhaler Commonly known as: SYMBICORT Symbicort 160 mcg-4.5 mcg/actuation HFA aerosol inhaler  INHALE 2 PUFFS INTO THE LUNGS 2 (TWO) TIMES DAILY.   clopidogrel 75 MG tablet Commonly known as: PLAVIX Take 1 tablet (75 mg total) by mouth daily.   doxepin 10 MG capsule Commonly known as: SINEQUAN Take 10 mg by mouth at bedtime as needed (sleep).   ezetimibe 10 MG tablet Commonly known as: ZETIA Take 1 tablet (10 mg total) by mouth daily.   fexofenadine 180 MG tablet Commonly known as: ALLEGRA Take 180 mg by mouth daily.   fluticasone 50 MCG/ACT nasal spray Commonly known as: FLONASE Place 1 spray into both nostrils daily as needed for allergies or rhinitis. Started by: Chesley Mires, MD   glimepiride 1 MG tablet Commonly known as: AMARYL Take 1 tablet (1 mg total) by mouth daily with breakfast or the first main meal of the day.   hydrOXYzine 25 MG tablet Commonly known as: ATARAX Take 1 tablet (25 mg total) by mouth daily  as needed for itching.   irbesartan-hydrochlorothiazide 150-12.5 MG tablet Commonly known as: AVALIDE Take 1 tablet by mouth daily.   Jardiance 25 MG Tabs tablet Generic drug: empagliflozin Take 1 tablet (25 mg total) by mouth daily.   levothyroxine 75 MCG tablet Commonly known as: SYNTHROID Take 1 tablet (75 mcg total) by mouth in the morning on an empty stomach What changed: Another medication with the same name was removed. Continue taking this medication, and follow the directions you see here. Changed by: Chesley Mires, MD   montelukast 10 MG tablet Commonly known as: SINGULAIR Take 1 tablet (10 mg total) by mouth at bedtime.   potassium chloride 10 MEQ tablet Commonly known as: KLOR-CON M TAKE 2 TABLETS (20 MEQ TOTAL) BY MOUTH DAILY. What changed: additional instructions   rosuvastatin 40 MG tablet Commonly  known as: CRESTOR Take 1 tablet (40 mg total) by mouth at bedtime for cholesterol        Signature:  Chesley Mires, MD [place] Pager - [phone] 02/14/2021, 11:14 AM

## 2021-02-14 NOTE — Patient Instructions (Signed)
Try using flonase 1 spray in each nostril nightly to help with sinus congestion and runny nose  Follow up in 1 year

## 2021-03-10 ENCOUNTER — Other Ambulatory Visit (HOSPITAL_COMMUNITY): Payer: Self-pay

## 2021-03-17 ENCOUNTER — Other Ambulatory Visit (HOSPITAL_COMMUNITY): Payer: Self-pay

## 2021-03-31 ENCOUNTER — Other Ambulatory Visit (HOSPITAL_COMMUNITY): Payer: Self-pay

## 2021-04-07 ENCOUNTER — Other Ambulatory Visit (HOSPITAL_COMMUNITY): Payer: Self-pay

## 2021-04-08 ENCOUNTER — Other Ambulatory Visit (HOSPITAL_COMMUNITY): Payer: Self-pay

## 2021-04-15 ENCOUNTER — Other Ambulatory Visit (HOSPITAL_COMMUNITY): Payer: Self-pay

## 2021-04-17 ENCOUNTER — Other Ambulatory Visit (HOSPITAL_COMMUNITY): Payer: Self-pay

## 2021-04-18 ENCOUNTER — Other Ambulatory Visit (HOSPITAL_COMMUNITY): Payer: Self-pay

## 2021-04-21 ENCOUNTER — Other Ambulatory Visit (HOSPITAL_COMMUNITY): Payer: Self-pay

## 2021-04-21 ENCOUNTER — Other Ambulatory Visit: Payer: Self-pay | Admitting: Cardiovascular Disease

## 2021-04-21 MED ORDER — BISOPROLOL FUMARATE 5 MG PO TABS
2.5000 mg | ORAL_TABLET | Freq: Every day | ORAL | 0 refills | Status: DC
  Filled 2021-04-21: qty 45, 90d supply, fill #0

## 2021-04-21 MED ORDER — GLIMEPIRIDE 1 MG PO TABS
1.0000 mg | ORAL_TABLET | Freq: Every day | ORAL | 6 refills | Status: DC
  Filled 2021-04-21: qty 30, 30d supply, fill #0
  Filled 2021-05-29: qty 30, 30d supply, fill #1
  Filled 2021-06-26: qty 30, 30d supply, fill #2
  Filled 2021-07-21: qty 30, 30d supply, fill #3
  Filled 2021-07-21: qty 90, 90d supply, fill #3

## 2021-04-22 ENCOUNTER — Other Ambulatory Visit (HOSPITAL_COMMUNITY): Payer: Self-pay

## 2021-04-22 MED ORDER — HYDROXYZINE HCL 25 MG PO TABS
25.0000 mg | ORAL_TABLET | Freq: Every day | ORAL | 5 refills | Status: DC | PRN
Start: 2021-04-22 — End: 2021-10-28
  Filled 2021-04-22: qty 30, 30d supply, fill #0
  Filled 2021-05-22: qty 30, 30d supply, fill #1
  Filled 2021-06-18: qty 30, 30d supply, fill #2
  Filled 2021-07-22: qty 30, 30d supply, fill #3
  Filled 2021-08-20: qty 30, 30d supply, fill #4
  Filled 2021-09-22: qty 30, 30d supply, fill #5

## 2021-04-30 ENCOUNTER — Other Ambulatory Visit (HOSPITAL_COMMUNITY): Payer: Self-pay

## 2021-04-30 ENCOUNTER — Other Ambulatory Visit: Payer: Self-pay | Admitting: Cardiovascular Disease

## 2021-05-02 ENCOUNTER — Other Ambulatory Visit (HOSPITAL_COMMUNITY): Payer: Self-pay

## 2021-05-02 NOTE — Telephone Encounter (Signed)
Left a message for the patient to call back to confirm the dosage she is taking of the potassium. ? ?Recent labs in February: ? ?Sodium 144  ?Potassium 4.2  ? ?

## 2021-05-05 ENCOUNTER — Other Ambulatory Visit: Payer: Self-pay | Admitting: Cardiovascular Disease

## 2021-05-05 ENCOUNTER — Other Ambulatory Visit (HOSPITAL_COMMUNITY): Payer: Self-pay

## 2021-05-05 MED ORDER — IRBESARTAN-HYDROCHLOROTHIAZIDE 150-12.5 MG PO TABS
1.0000 | ORAL_TABLET | Freq: Every day | ORAL | 3 refills | Status: DC
  Filled 2021-05-05: qty 30, 30d supply, fill #0
  Filled 2021-06-12: qty 30, 30d supply, fill #1
  Filled 2021-07-10: qty 30, 30d supply, fill #2
  Filled 2021-08-06: qty 30, 30d supply, fill #3

## 2021-05-08 ENCOUNTER — Encounter: Payer: Self-pay | Admitting: Cardiovascular Disease

## 2021-05-08 ENCOUNTER — Other Ambulatory Visit: Payer: Self-pay | Admitting: Cardiovascular Disease

## 2021-05-08 ENCOUNTER — Other Ambulatory Visit (HOSPITAL_COMMUNITY): Payer: Self-pay

## 2021-05-08 MED ORDER — POTASSIUM CHLORIDE CRYS ER 10 MEQ PO TBCR
10.0000 meq | EXTENDED_RELEASE_TABLET | Freq: Every day | ORAL | 3 refills | Status: DC
  Filled 2021-05-08: qty 90, 90d supply, fill #0
  Filled 2021-08-06: qty 90, 90d supply, fill #1
  Filled 2021-11-05: qty 90, 90d supply, fill #2
  Filled 2022-02-10: qty 90, 90d supply, fill #3

## 2021-05-08 MED ORDER — ROSUVASTATIN CALCIUM 40 MG PO TABS
40.0000 mg | ORAL_TABLET | Freq: Every evening | ORAL | 0 refills | Status: DC | PRN
  Filled 2021-05-08: qty 90, 90d supply, fill #0

## 2021-05-16 ENCOUNTER — Other Ambulatory Visit (HOSPITAL_COMMUNITY): Payer: Self-pay

## 2021-05-22 ENCOUNTER — Other Ambulatory Visit (HOSPITAL_COMMUNITY): Payer: Self-pay

## 2021-05-29 ENCOUNTER — Other Ambulatory Visit (HOSPITAL_COMMUNITY): Payer: Self-pay

## 2021-05-30 ENCOUNTER — Other Ambulatory Visit (HOSPITAL_COMMUNITY): Payer: Self-pay

## 2021-06-12 ENCOUNTER — Other Ambulatory Visit (HOSPITAL_COMMUNITY): Payer: Self-pay

## 2021-06-18 ENCOUNTER — Other Ambulatory Visit (HOSPITAL_COMMUNITY): Payer: Self-pay

## 2021-06-27 ENCOUNTER — Other Ambulatory Visit (HOSPITAL_COMMUNITY): Payer: Self-pay

## 2021-07-10 ENCOUNTER — Other Ambulatory Visit (HOSPITAL_COMMUNITY): Payer: Self-pay

## 2021-07-10 MED ORDER — LEVOTHYROXINE SODIUM 75 MCG PO TABS
75.0000 ug | ORAL_TABLET | Freq: Every morning | ORAL | 3 refills | Status: DC
  Filled 2021-07-10: qty 30, 30d supply, fill #0
  Filled 2021-08-06: qty 30, 30d supply, fill #1
  Filled 2021-09-09 (×2): qty 30, 30d supply, fill #2
  Filled 2021-10-08: qty 30, 30d supply, fill #3

## 2021-07-21 ENCOUNTER — Other Ambulatory Visit (HOSPITAL_COMMUNITY): Payer: Self-pay

## 2021-07-21 ENCOUNTER — Other Ambulatory Visit: Payer: Self-pay | Admitting: Cardiovascular Disease

## 2021-07-21 MED ORDER — BISOPROLOL FUMARATE 5 MG PO TABS
2.5000 mg | ORAL_TABLET | Freq: Every day | ORAL | 0 refills | Status: DC
Start: 2021-07-21 — End: 2021-10-28
  Filled 2021-07-21: qty 45, 90d supply, fill #0

## 2021-07-22 ENCOUNTER — Other Ambulatory Visit (HOSPITAL_COMMUNITY): Payer: Self-pay

## 2021-07-23 ENCOUNTER — Other Ambulatory Visit (HOSPITAL_COMMUNITY): Payer: Self-pay

## 2021-07-23 MED ORDER — GLIMEPIRIDE 1 MG PO TABS
1.0000 mg | ORAL_TABLET | Freq: Every day | ORAL | 4 refills | Status: DC
  Filled 2021-07-23: qty 90, 90d supply, fill #0
  Filled 2021-10-28: qty 90, 90d supply, fill #1
  Filled 2022-02-03: qty 90, 90d supply, fill #2
  Filled 2022-05-07: qty 90, 90d supply, fill #3

## 2021-08-06 ENCOUNTER — Other Ambulatory Visit (HOSPITAL_COMMUNITY): Payer: Self-pay

## 2021-08-07 ENCOUNTER — Other Ambulatory Visit (HOSPITAL_COMMUNITY): Payer: Self-pay

## 2021-08-07 MED ORDER — ROSUVASTATIN CALCIUM 40 MG PO TABS
40.0000 mg | ORAL_TABLET | Freq: Every evening | ORAL | 0 refills | Status: DC | PRN
  Filled 2021-08-07: qty 90, 90d supply, fill #0

## 2021-08-12 ENCOUNTER — Other Ambulatory Visit (HOSPITAL_COMMUNITY): Payer: Self-pay

## 2021-08-20 ENCOUNTER — Other Ambulatory Visit (HOSPITAL_COMMUNITY): Payer: Self-pay

## 2021-09-09 ENCOUNTER — Other Ambulatory Visit: Payer: Self-pay | Admitting: Cardiovascular Disease

## 2021-09-09 ENCOUNTER — Other Ambulatory Visit (HOSPITAL_COMMUNITY): Payer: Self-pay

## 2021-09-11 ENCOUNTER — Other Ambulatory Visit (HOSPITAL_COMMUNITY): Payer: Self-pay

## 2021-09-11 MED ORDER — IRBESARTAN-HYDROCHLOROTHIAZIDE 150-12.5 MG PO TABS
1.0000 | ORAL_TABLET | Freq: Every day | ORAL | 6 refills | Status: DC
  Filled 2021-09-11: qty 30, 30d supply, fill #0
  Filled 2021-10-08: qty 30, 30d supply, fill #1
  Filled 2021-11-12: qty 30, 30d supply, fill #2
  Filled 2021-12-21: qty 30, 30d supply, fill #3
  Filled 2022-01-21: qty 30, 30d supply, fill #4
  Filled 2022-02-17: qty 30, 30d supply, fill #5
  Filled 2022-03-25: qty 30, 30d supply, fill #6

## 2021-09-20 LAB — COLOGUARD: COLOGUARD: POSITIVE — AB

## 2021-09-22 ENCOUNTER — Other Ambulatory Visit (HOSPITAL_COMMUNITY): Payer: Self-pay

## 2021-09-30 ENCOUNTER — Other Ambulatory Visit (HOSPITAL_COMMUNITY): Payer: Self-pay

## 2021-10-08 ENCOUNTER — Other Ambulatory Visit (HOSPITAL_COMMUNITY): Payer: Self-pay

## 2021-10-09 ENCOUNTER — Other Ambulatory Visit (HOSPITAL_COMMUNITY): Payer: Self-pay

## 2021-10-09 MED ORDER — JARDIANCE 25 MG PO TABS
25.0000 mg | ORAL_TABLET | Freq: Every day | ORAL | 11 refills | Status: DC
  Filled 2021-10-09: qty 30, 30d supply, fill #0

## 2021-10-09 MED ORDER — JARDIANCE 25 MG PO TABS
25.0000 mg | ORAL_TABLET | Freq: Every morning | ORAL | 5 refills | Status: DC
  Filled 2021-10-09: qty 90, 90d supply, fill #0
  Filled 2022-03-31: qty 90, 90d supply, fill #1
  Filled 2022-06-29: qty 90, 90d supply, fill #2
  Filled 2022-10-06: qty 90, 90d supply, fill #3

## 2021-10-14 ENCOUNTER — Other Ambulatory Visit (HOSPITAL_COMMUNITY): Payer: Self-pay

## 2021-10-14 MED ORDER — AMOXICILLIN-POT CLAVULANATE 500-125 MG PO TABS
1.0000 | ORAL_TABLET | Freq: Two times a day (BID) | ORAL | 0 refills | Status: AC
  Filled 2021-10-14: qty 6, 3d supply, fill #0

## 2021-10-21 ENCOUNTER — Other Ambulatory Visit (HOSPITAL_COMMUNITY): Payer: Self-pay

## 2021-10-22 ENCOUNTER — Other Ambulatory Visit (HOSPITAL_COMMUNITY): Payer: Self-pay

## 2021-10-23 ENCOUNTER — Encounter: Payer: Self-pay | Admitting: *Deleted

## 2021-10-28 ENCOUNTER — Other Ambulatory Visit: Payer: Self-pay | Admitting: Cardiovascular Disease

## 2021-10-28 ENCOUNTER — Other Ambulatory Visit (HOSPITAL_COMMUNITY): Payer: Self-pay

## 2021-10-28 MED ORDER — HYDROXYZINE HCL 25 MG PO TABS
25.0000 mg | ORAL_TABLET | Freq: Every day | ORAL | 2 refills | Status: DC | PRN
  Filled 2021-10-28: qty 30, 30d supply, fill #0
  Filled 2021-11-28: qty 30, 30d supply, fill #1
  Filled 2021-12-30: qty 30, 30d supply, fill #2

## 2021-10-28 MED ORDER — BISOPROLOL FUMARATE 5 MG PO TABS
2.5000 mg | ORAL_TABLET | Freq: Every day | ORAL | 0 refills | Status: DC
  Filled 2021-10-28: qty 45, 90d supply, fill #0

## 2021-10-28 NOTE — Telephone Encounter (Signed)
Rx refill sent to pharmacy. 

## 2021-11-05 ENCOUNTER — Other Ambulatory Visit (HOSPITAL_COMMUNITY): Payer: Self-pay

## 2021-11-05 MED ORDER — ROSUVASTATIN CALCIUM 40 MG PO TABS
40.0000 mg | ORAL_TABLET | Freq: Every evening | ORAL | 3 refills | Status: AC | PRN
  Filled 2021-11-05: qty 90, 90d supply, fill #0
  Filled 2022-02-03: qty 90, 90d supply, fill #1
  Filled 2022-05-07: qty 90, 90d supply, fill #2
  Filled 2022-08-05: qty 90, 90d supply, fill #3

## 2021-11-06 ENCOUNTER — Other Ambulatory Visit (HOSPITAL_COMMUNITY): Payer: Self-pay

## 2021-11-06 MED ORDER — LEVOTHYROXINE SODIUM 75 MCG PO TABS
75.0000 ug | ORAL_TABLET | Freq: Every morning | ORAL | 4 refills | Status: DC
  Filled 2021-11-06: qty 30, 30d supply, fill #0
  Filled 2021-12-03: qty 30, 30d supply, fill #1
  Filled 2022-01-07 – 2022-01-08 (×2): qty 30, 30d supply, fill #2
  Filled 2022-02-03: qty 30, 30d supply, fill #3
  Filled 2022-03-05: qty 30, 30d supply, fill #4

## 2021-11-12 ENCOUNTER — Other Ambulatory Visit (HOSPITAL_COMMUNITY): Payer: Self-pay

## 2021-11-26 ENCOUNTER — Encounter: Payer: Self-pay | Admitting: *Deleted

## 2021-11-26 ENCOUNTER — Encounter: Payer: Self-pay | Admitting: Gastroenterology

## 2021-11-26 ENCOUNTER — Ambulatory Visit (INDEPENDENT_AMBULATORY_CARE_PROVIDER_SITE_OTHER): Payer: Medicare Other | Admitting: Gastroenterology

## 2021-11-26 VITALS — BP 117/66 | HR 76 | Temp 97.1°F | Ht 65.0 in | Wt 178.9 lb

## 2021-11-26 DIAGNOSIS — R195 Other fecal abnormalities: Secondary | ICD-10-CM | POA: Insufficient documentation

## 2021-11-26 NOTE — Progress Notes (Signed)
Gastroenterology Office Note    Referring Provider: [name], MD Primary Care Physician:  Denyce Robert, FNP  Primary GI: Dr. Abbey Chatters    Chief Complaint   Chief Complaint  Patient presents with   Positive cologuard    Patient here today due to a positive cologuard.Patient denies any current gi issues.     History of Present Illness   Shelley [name] is a 66 y.o. female presenting today at the request of Denyce Robert, FNP due to positive Cologuard in Sept 2023.   No abdominal pain, N/V, changes in bowel habits, constipation, diarrhea, overt GI bleeding, GERD, dysphagia, unexplained weight loss, lack of appetite, unexplained weight gain.   No FH colon cancer or polyps.      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    Diabetes mellitus    Dyslipidemia    Hypertension    MI (myocardial infarction) ([place])    Shingles    Thyroid disease     Past Surgical History:  Procedure Laterality Date   BACK SURGERY  5/99 & 6/99   CARDIAC CATHETERIZATION  01/12/2002   stent patent   CARDIAC CATHETERIZATION  09/03/2006   patent stents   CARDIAC CATHETERIZATION N/A 07/31/2015   Procedure: Left Heart Cath and Coronary Angiography;  Surgeon: Burnell Blanks, MD;  Location: [facility];  Service: Cardiovascular;  Laterality: N/A;   CARDIAC CATHETERIZATION N/A 07/31/2015   Procedure: Coronary Stent Intervention;  Surgeon: Burnell Blanks, MD;  Location: [facility];  Service: Cardiovascular;  Laterality: N/A;   CARDIAC CATHETERIZATION N/A 08/02/2015   Procedure: Coronary Stent Intervention;  Surgeon: Leonie Man, MD;  Location: [facility] [facility];  Service: Cardiovascular;  Laterality: N/A;   CORONARY ANGIOPLASTY WITH STENT PLACEMENT  07/17/1998   stent LAD   CORONARY ANGIOPLASTY WITH STENT PLACEMENT  01/07/2006   stent RCA,LAD stent patent   CORONARY ANGIOPLASTY  WITH STENT PLACEMENT  03/18/2006   stent to mid RCA,prox.RCA and LAD stent patent   CRYOTHERAPY     THYROID SURGERY     PT. DENIES/   just radiation RADIATION   TOTAL HIP ARTHROPLASTY Right 12/21/2017   Procedure: TOTAL HIP ARTHROPLASTY ANTERIOR APPROACH;  Surgeon: Paralee Cancel, MD;  Location: [facility];  Service: Orthopedics;  Laterality: Right;  70 mins   TUBAL LIGATION      Current Outpatient Medications  Medication Sig Dispense Refill   ACCU-CHEK FASTCLIX LANCETS MISC   3   ACCU-CHEK GUIDE test strip   3   ASPIRIN ADULT LOW STRENGTH 81 MG EC tablet TAKE 1 TABLET BY MOUTH ONCE DAILY 30 tablet 3   bisoprolol (ZEBETA) 5 MG tablet Take 0.5 tablets (2.5 mg total) by mouth daily. 45 tablet 0   clopidogrel (PLAVIX) 75 MG tablet Take 1 tablet (75 mg total) by mouth daily. 90 tablet 3   doxepin (SINEQUAN) 10 MG capsule Take 10 mg by mouth at bedtime as needed (sleep).     empagliflozin (JARDIANCE) 25 MG TABS tablet Take 1 tablet (25 mg total) by mouth in the morning. 90 tablet 5   ezetimibe (ZETIA) 10 MG tablet Take 1 tablet (10 mg total) by mouth daily. 90 tablet 3   fexofenadine (ALLEGRA) 180 MG tablet Take 180 mg by mouth daily.      fluticasone (FLONASE) 50 MCG/ACT nasal spray Place 1 spray  into both nostrils daily as needed for allergies or rhinitis.     glimepiride (AMARYL) 1 MG tablet Take 1 tablet (1 mg total) by mouth daily with breakfast or the first meal of the day 90 tablet 4   hydrOXYzine (ATARAX) 25 MG tablet Take 1 tablet (25 mg total) by mouth daily as needed for itching. 30 tablet 2   irbesartan-hydrochlorothiazide (AVALIDE) 150-12.5 MG tablet Take 1 tablet by mouth daily. 30 tablet 6   levothyroxine (SYNTHROID) 75 MCG tablet Take 1 tablet (75 mcg total) by mouth in the morning on an empty stomach 30 tablet 4   montelukast (SINGULAIR) 10 MG tablet Take 1 tablet (10 mg total) by mouth at bedtime. 90 tablet 3   potassium chloride (KLOR-CON M) 10 MEQ tablet Take 1 tablet (10 mEq  total) by mouth daily. 90 tablet 3   rosuvastatin (CRESTOR) 40 MG tablet Take 1 tablet (40 mg total) by mouth at bedtime for cholesterol 90 tablet 3   budesonide-formoterol (SYMBICORT) 160-4.5 MCG/ACT inhaler Symbicort 160 mcg-4.5 mcg/actuation HFA aerosol inhaler  INHALE 2 PUFFS INTO THE LUNGS 2 (TWO) TIMES DAILY. (Patient not taking: Reported on 11/26/2021)     No current facility-administered medications for this visit.    Allergies as of 11/26/2021 - Review Complete 11/26/2021  Allergen Reaction Noted   Coreg [carvedilol] Shortness Of Breath 03/15/2017   Levothyroxine Itching 08/14/2012   Lopressor [metoprolol tartrate] Cough 08/14/2012   Metformin and related Diarrhea 08/14/2014   Norvasc [amlodipine besylate] Palpitations 08/14/2012    Family History  Problem Relation Age of Onset   Hypertension Mother    Thyroid disease Mother    Esophageal cancer Maternal Aunt    Prostate cancer Maternal Uncle     Social History   Socioeconomic History   Marital status: Married    Spouse name: Not on file   Number of children: Not on file   Years of education: Not on file   Highest education level: Not on file  Occupational History   Occupation: EKG Tech    Employer: [employer]  Tobacco Use   Smoking status: Former    Packs/day: 0.50    Years: 34.00    Total pack years: 17.00    Types: Cigarettes    Quit date: 2008    Years since quitting: 15.9   Smokeless tobacco: Former    Quit date: 01/05/2005  Vaping Use   Vaping Use: Never used  Substance and Sexual Activity   Alcohol use: No    Alcohol/week: 0.0 standard drinks of alcohol   Drug use: No   Sexual activity: Yes    Partners: Male    Birth control/protection: Post-menopausal  Other Topics Concern   Not on file  Social History Narrative   Not on file   Social Determinants of Health   Financial Resource Strain: Not on file  Food Insecurity: Not on file  Transportation [name]: Not on file  Physical Activity: Not  on file  Stress: Not on file  Social Connections: Not on file  Intimate Partner Violence: Not on file     Review of Systems   Gen: Denies any fever, chills, fatigue, weight loss, lack of appetite.  CV: Denies chest pain, heart palpitations, peripheral edema, syncope.  Resp: Denies shortness of breath at rest or with exertion. Denies wheezing or cough.  GI: Denies dysphagia or odynophagia. Denies jaundice, hematemesis, fecal incontinence. GU : Denies urinary burning, urinary frequency, urinary hesitancy MS: Denies joint pain, muscle weakness, cramps, or  limitation of movement.  Derm: Denies rash, itching, dry skin Psych: Denies depression, anxiety, memory loss, and confusion Heme: Denies bruising, bleeding, and enlarged lymph nodes.   Physical Exam   BP 117/66 (BP Location: Left Arm, Patient Position: Sitting, Cuff Size: Large)   Pulse 76   Temp (!) 97.1 F (36.2 C) (Temporal)   Ht '5\' 5"'$  (1.651 m)   Wt 178 lb 14.4 oz (81.1 kg)   LMP 01/06/2003 (Approximate)   BMI 29.77 kg/m  General:   Alert and oriented. Pleasant and cooperative. Well-nourished and well-developed.  Head:  Normocephalic and atraumatic. Eyes:  Without icterus Ears:  Normal auditory acuity. Lungs:  Clear to auscultation bilaterally.  Heart:  S1, S2 present without murmurs appreciated.  Abdomen:  +BS, soft, non-tender and non-distended. No HSM noted. No guarding or rebound. No masses appreciated.  Rectal:  Deferred  Msk:  Symmetrical without gross deformities. Normal posture. Extremities:  Without edema. Neurologic:  Alert and  oriented x4;  grossly normal neurologically. Skin:  Intact without significant lesions or rashes. Psych:  Alert and cooperative. Normal mood and affect.   Assessment   Shelley [name] is a 66 y.o. female presenting today at the request of Denyce Robert, FNP due to positive Cologuard in Sept 2023.   She has no overt GI bleeding or any concerning lower/upper GI  signs/symptoms. No FH colon cancer or polyps. No prior colonoscopy.     PLAN   Proceed with colonoscopy by Dr. Abbey Chatters  in near future: the risks, benefits, and alternatives have been discussed with the patient in detail. The patient states understanding and desires to proceed. ASA 3  HOLD PLAVIX 5 days prior  Jardiance on hold 72 hours prior   Shelley Needs, PhD, [facility] Gastroenterology

## 2021-11-26 NOTE — H&P (View-Only) (Signed)
Gastroenterology Office Note    Referring Provider: [name], MD Primary Care Physician:  Denyce Robert, FNP  Primary GI: Dr. Abbey Chatters    Chief Complaint   Chief Complaint  Patient presents with   Positive cologuard    Patient here today due to a positive cologuard.Patient denies any current gi issues.     History of Present Illness   Shelley [name] is a 66 y.o. female presenting today at the request of Denyce Robert, FNP due to positive Cologuard in Sept 2023.   No abdominal pain, N/V, changes in bowel habits, constipation, diarrhea, overt GI bleeding, GERD, dysphagia, unexplained weight loss, lack of appetite, unexplained weight gain.   No FH colon cancer or polyps.      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    Diabetes mellitus    Dyslipidemia    Hypertension    MI (myocardial infarction) ([place])    Shingles    Thyroid disease     Past Surgical History:  Procedure Laterality Date   BACK SURGERY  5/99 & 6/99   CARDIAC CATHETERIZATION  01/12/2002   stent patent   CARDIAC CATHETERIZATION  09/03/2006   patent stents   CARDIAC CATHETERIZATION N/A 07/31/2015   Procedure: Left Heart Cath and Coronary Angiography;  Surgeon: Burnell Blanks, MD;  Location: [facility];  Service: Cardiovascular;  Laterality: N/A;   CARDIAC CATHETERIZATION N/A 07/31/2015   Procedure: Coronary Stent Intervention;  Surgeon: Burnell Blanks, MD;  Location: [facility];  Service: Cardiovascular;  Laterality: N/A;   CARDIAC CATHETERIZATION N/A 08/02/2015   Procedure: Coronary Stent Intervention;  Surgeon: Leonie Man, MD;  Location: [facility];  Service: Cardiovascular;  Laterality: N/A;   CORONARY ANGIOPLASTY WITH STENT PLACEMENT  07/17/1998   stent LAD   CORONARY ANGIOPLASTY WITH STENT PLACEMENT  01/07/2006   stent RCA,LAD stent patent   CORONARY ANGIOPLASTY  WITH STENT PLACEMENT  03/18/2006   stent to mid RCA,prox.RCA and LAD stent patent   CRYOTHERAPY     THYROID SURGERY     PT. DENIES/   just radiation RADIATION   TOTAL HIP ARTHROPLASTY Right 12/21/2017   Procedure: TOTAL HIP ARTHROPLASTY ANTERIOR APPROACH;  Surgeon: Paralee Cancel, MD;  Location: [facility];  Service: Orthopedics;  Laterality: Right;  70 mins   TUBAL LIGATION      Current Outpatient Medications  Medication Sig Dispense Refill   ACCU-CHEK FASTCLIX LANCETS MISC   3   ACCU-CHEK GUIDE test strip   3   ASPIRIN ADULT LOW STRENGTH 81 MG EC tablet TAKE 1 TABLET BY MOUTH ONCE DAILY 30 tablet 3   bisoprolol (ZEBETA) 5 MG tablet Take 0.5 tablets (2.5 mg total) by mouth daily. 45 tablet 0   clopidogrel (PLAVIX) 75 MG tablet Take 1 tablet (75 mg total) by mouth daily. 90 tablet 3   doxepin (SINEQUAN) 10 MG capsule Take 10 mg by mouth at bedtime as needed (sleep).     empagliflozin (JARDIANCE) 25 MG TABS tablet Take 1 tablet (25 mg total) by mouth in the morning. 90 tablet 5   ezetimibe (ZETIA) 10 MG tablet Take 1 tablet (10 mg total) by mouth daily. 90 tablet 3   fexofenadine (ALLEGRA) 180 MG tablet Take 180 mg by mouth daily.      fluticasone (FLONASE) 50 MCG/ACT nasal spray Place 1 spray  into both nostrils daily as needed for allergies or rhinitis.     glimepiride (AMARYL) 1 MG tablet Take 1 tablet (1 mg total) by mouth daily with breakfast or the first meal of the day 90 tablet 4   hydrOXYzine (ATARAX) 25 MG tablet Take 1 tablet (25 mg total) by mouth daily as needed for itching. 30 tablet 2   irbesartan-hydrochlorothiazide (AVALIDE) 150-12.5 MG tablet Take 1 tablet by mouth daily. 30 tablet 6   levothyroxine (SYNTHROID) 75 MCG tablet Take 1 tablet (75 mcg total) by mouth in the morning on an empty stomach 30 tablet 4   montelukast (SINGULAIR) 10 MG tablet Take 1 tablet (10 mg total) by mouth at bedtime. 90 tablet 3   potassium chloride (KLOR-CON M) 10 MEQ tablet Take 1 tablet (10 mEq  total) by mouth daily. 90 tablet 3   rosuvastatin (CRESTOR) 40 MG tablet Take 1 tablet (40 mg total) by mouth at bedtime for cholesterol 90 tablet 3   budesonide-formoterol (SYMBICORT) 160-4.5 MCG/ACT inhaler Symbicort 160 mcg-4.5 mcg/actuation HFA aerosol inhaler  INHALE 2 PUFFS INTO THE LUNGS 2 (TWO) TIMES DAILY. (Patient not taking: Reported on 11/26/2021)     No current facility-administered medications for this visit.    Allergies as of 11/26/2021 - Review Complete 11/26/2021  Allergen Reaction Noted   Coreg [carvedilol] Shortness Of Breath 03/15/2017   Levothyroxine Itching 08/14/2012   Lopressor [metoprolol tartrate] Cough 08/14/2012   Metformin and related Diarrhea 08/14/2014   Norvasc [amlodipine besylate] Palpitations 08/14/2012    Family History  Problem Relation Age of Onset   Hypertension Mother    Thyroid disease Mother    Esophageal cancer Maternal Aunt    Prostate cancer Maternal Uncle     Social History   Socioeconomic History   Marital status: Married    Spouse name: Not on file   Number of children: Not on file   Years of education: Not on file   Highest education level: Not on file  Occupational History   Occupation: EKG Tech    Employer: [employer]  Tobacco Use   Smoking status: Former    Packs/day: 0.50    Years: 34.00    Total pack years: 17.00    Types: Cigarettes    Quit date: 2008    Years since quitting: 15.9   Smokeless tobacco: Former    Quit date: 01/05/2005  Vaping Use   Vaping Use: Never used  Substance and Sexual Activity   Alcohol use: No    Alcohol/week: 0.0 standard drinks of alcohol   Drug use: No   Sexual activity: Yes    Partners: Male    Birth control/protection: Post-menopausal  Other Topics Concern   Not on file  Social History Narrative   Not on file   Social Determinants of Health   Financial Resource Strain: Not on file  Food Insecurity: Not on file  Transportation Needs: Not on file  Physical Activity: Not  on file  Stress: Not on file  Social Connections: Not on file  Intimate Partner Violence: Not on file     Review of Systems   Gen: Denies any fever, chills, fatigue, weight loss, lack of appetite.  CV: Denies chest pain, heart palpitations, peripheral edema, syncope.  Resp: Denies shortness of breath at rest or with exertion. Denies wheezing or cough.  GI: Denies dysphagia or odynophagia. Denies jaundice, hematemesis, fecal incontinence. GU : Denies urinary burning, urinary frequency, urinary hesitancy MS: Denies joint pain, muscle weakness, cramps, or  limitation of movement.  Derm: Denies rash, itching, dry skin Psych: Denies depression, anxiety, memory loss, and confusion Heme: Denies bruising, bleeding, and enlarged lymph nodes.   Physical Exam   BP 117/66 (BP Location: Left Arm, Patient Position: Sitting, Cuff Size: Large)   Pulse 76   Temp (!) 97.1 F (36.2 C) (Temporal)   Ht '5\' 5"'$  (1.651 m)   Wt 178 lb 14.4 oz (81.1 kg)   LMP 01/06/2003 (Approximate)   BMI 29.77 kg/m  General:   Alert and oriented. Pleasant and cooperative. Well-nourished and well-developed.  Head:  Normocephalic and atraumatic. Eyes:  Without icterus Ears:  Normal auditory acuity. Lungs:  Clear to auscultation bilaterally.  Heart:  S1, S2 present without murmurs appreciated.  Abdomen:  +BS, soft, non-tender and non-distended. No HSM noted. No guarding or rebound. No masses appreciated.  Rectal:  Deferred  Msk:  Symmetrical without gross deformities. Normal posture. Extremities:  Without edema. Neurologic:  Alert and  oriented x4;  grossly normal neurologically. Skin:  Intact without significant lesions or rashes. Psych:  Alert and cooperative. Normal mood and affect.   Assessment   Shelley [name] is a 66 y.o. female presenting today at the request of Denyce Robert, FNP due to positive Cologuard in Sept 2023.   She has no overt GI bleeding or any concerning lower/upper GI  signs/symptoms. No FH colon cancer or polyps. No prior colonoscopy.     PLAN   Proceed with colonoscopy by Dr. Abbey Chatters  in near future: the risks, benefits, and alternatives have been discussed with the patient in detail. The patient states understanding and desires to proceed. ASA 3  HOLD PLAVIX 5 days prior  Jardiance on hold 72 hours prior   Annitta Needs, PhD, [facility] Gastroenterology

## 2021-11-26 NOTE — Patient Instructions (Signed)
We are arranging a colonoscopy with Dr. Abbey Chatters in the near future.  Please stop Plavix 5 days before the procedure.  Stop Jardiance 72 hours before procedure.  No glimiperide the morning of the procedure.  Have a great Thanksgiving!  It was a pleasure to see you today. I want to create trusting relationships with patients to provide genuine, compassionate, and quality care. I value your feedback. If you receive a survey regarding your visit,  I greatly appreciate you taking time to fill this out.   Annitta Needs, PhD, ANP-BC [facility] Gastroenterology

## 2021-11-28 ENCOUNTER — Other Ambulatory Visit (HOSPITAL_COMMUNITY): Payer: Self-pay

## 2021-12-01 NOTE — Patient Instructions (Addendum)
Shelley [name]  12/01/2021     '@PREFPERIOPPHARMACY'$ @   Your procedure is scheduled on  12/08/2021.   Report to [facility] at  1225  P.M.   Call this number if you have problems the morning of surgery:  [phone]  If you experience any cold or flu symptoms such as cough, fever, chills, shortness of breath, etc. between now and your scheduled surgery, please notify us at the above number.   Remember:  Follow the diet and prep instructions given to you by the office.     Take these medicines the morning of surgery with A SIP OF WATER                  bisoprolol, levothyroxine, singulair.     Do not wear jewelry, make-up or nail polish.  Do not wear lotions, powders, or perfumes, or deodorant.  Do not shave 48 hours prior to surgery.  Men may shave face and neck.  Do not bring valuables to the hospital.  [facility] is not responsible for any belongings or valuables.  Contacts, dentures or bridgework may not be worn into surgery.  Leave your suitcase in the car.  After surgery it may be brought to your room.  For patients admitted to the hospital, discharge time will be determined by your treatment team.  Patients discharged the day of surgery will not be allowed to drive home and must have someone with them for 24 hours.    Special instructions:   DO NOT smoke tobacco or vape for 24 hours before your procedure.  Please read over the following fact sheets that you were given. Anesthesia Post-op Instructions and Care and Recovery After Surgery       Colonoscopy, Adult, Care After The following information offers guidance on how to care for yourself after your procedure. Your health care provider may also give you more specific instructions. If you have problems or questions, contact your health care provider. What can I expect after the procedure? After the procedure, it is common to have: A small amount of blood in your stool for 24 hours after the  procedure. Some gas. Mild cramping or bloating of your abdomen. Follow these instructions at home: Eating and drinking  Drink enough fluid to keep your urine pale yellow. Follow instructions from your health care provider about eating or drinking restrictions. Resume your normal diet as told by your health care provider. Avoid heavy or fried foods that are hard to digest. Activity Rest as told by your health care provider. Avoid sitting for a long time without moving. Get up to take short walks every 1-2 hours. This is important to improve blood flow and breathing. Ask for help if you feel weak or unsteady. Return to your normal activities as told by your health care provider. Ask your health care provider what activities are safe for you. Managing cramping and bloating  Try walking around when you have cramps or feel bloated. If directed, apply heat to your abdomen as told by your health care provider. Use the heat source that your health care provider recommends, such as a moist heat pack or a heating pad. Place a towel between your skin and the heat source. Leave the heat on for 20-30 minutes. Remove the heat if your skin turns bright red. This is especially important if you are unable to feel pain, heat, or cold. You have a greater risk of getting burned. General instructions  If you were given a sedative during the procedure, it can affect you for several hours. Do not drive or operate machinery until your health care provider says that it is safe. For the first 24 hours after the procedure: Do not sign important documents. Do not drink alcohol. Do your regular daily activities at a slower pace than normal. Eat soft foods that are easy to digest. Take over-the-counter and prescription medicines only as told by your health care provider. Keep all follow-up visits. This is important. Contact a health care provider if: You have blood in your stool 2-3 days after the procedure. Get  help right away if: You have more than a small spotting of blood in your stool. You have large blood clots in your stool. You have swelling of your abdomen. You have nausea or vomiting. You have a fever. You have increasing pain in your abdomen that is not relieved with medicine. These symptoms may be an emergency. Get help right away. Call 911. Do not wait to see if the symptoms will go away. Do not drive yourself to the hospital. Summary After the procedure, it is common to have a small amount of blood in your stool. You may also have mild cramping and bloating of your abdomen. If you were given a sedative during the procedure, it can affect you for several hours. Do not drive or operate machinery until your health care provider says that it is safe. Get help right away if you have a lot of blood in your stool, nausea or vomiting, a fever, or increased pain in your abdomen. This information is not intended to replace advice given to you by your health care provider. Make sure you discuss any questions you have with your health care provider. Document Revised: 08/14/2020 Document Reviewed: 08/14/2020 Elsevier Patient Education  [place] After The following information offers guidance on how to care for yourself after your procedure. Your health care provider may also give you more specific instructions. If you have problems or questions, contact your health care provider. What can I expect after the procedure? After the procedure, it is common to have: Tiredness. Little or no memory about what happened during or after the procedure. Impaired judgment when it comes to making decisions. Nausea or vomiting. Some trouble with balance. Follow these instructions at home: For the time period you were told by your health care provider:  Rest. Do not participate in activities where you could fall or become injured. Do not drive or use machinery. Do  not drink alcohol. Do not take sleeping pills or medicines that cause drowsiness. Do not make important decisions or sign legal documents. Do not take care of children on your own. Medicines Take over-the-counter and prescription medicines only as told by your health care provider. If you were prescribed antibiotics, take them as told by your health care provider. Do not stop using the antibiotic even if you start to feel better. Eating and drinking Follow instructions from your health care provider about what you may eat and drink. Drink enough fluid to keep your urine pale yellow. If you vomit: Drink clear fluids slowly and in small amounts as you are able. Clear fluids include water, ice chips, low-calorie sports drinks, and fruit juice that has water added to it (diluted fruit juice). Eat light and bland foods in small amounts as you are able. These foods include bananas, applesauce, rice, lean meats, toast, and crackers. General instructions  Have  a responsible adult stay with you for the time you are told. It is important to have someone help care for you until you are awake and alert. If you have sleep apnea, surgery and some medicines can increase your risk for breathing problems. Follow instructions from your health care provider about wearing your sleep device: When you are sleeping. This includes during daytime naps. While taking prescription pain medicines, sleeping medicines, or medicines that make you drowsy. Do not use any products that contain nicotine or tobacco. These products include cigarettes, chewing tobacco, and vaping devices, such as e-cigarettes. If you need help quitting, ask your health care provider. Contact a health care provider if: You feel nauseous or vomit every time you eat or drink. You feel light-headed. You are still sleepy or having trouble with balance after 24 hours. You get a rash. You have a fever. You have redness or swelling around the IV  site. Get help right away if: You have trouble breathing. You have new confusion after you get home. These symptoms may be an emergency. Get help right away. Call 911. Do not wait to see if the symptoms will go away. Do not drive yourself to the hospital. This information is not intended to replace advice given to you by your health care provider. Make sure you discuss any questions you have with your health care provider. Document Revised: 05/19/2021 Document Reviewed: 05/19/2021 Elsevier Patient Education  [place].

## 2021-12-02 ENCOUNTER — Encounter (HOSPITAL_COMMUNITY)
Admission: RE | Admit: 2021-12-02 | Discharge: 2021-12-02 | Disposition: A | Discharge status: Home / Self Care | Payer: Medicare Other | Source: Ambulatory Visit | Attending: Internal Medicine | Admitting: Internal Medicine

## 2021-12-02 ENCOUNTER — Encounter (HOSPITAL_COMMUNITY): Payer: Self-pay

## 2021-12-02 VITALS — BP 105/57 | HR 86 | Temp 97.2°F | Resp 18 | Ht 65.0 in | Wt 178.9 lb

## 2021-12-02 DIAGNOSIS — E119 Type 2 diabetes mellitus without complications: Secondary | ICD-10-CM | POA: Diagnosis not present

## 2021-12-02 DIAGNOSIS — Z01812 Encounter for preprocedural laboratory examination: Secondary | ICD-10-CM | POA: Insufficient documentation

## 2021-12-02 HISTORY — DX: Unspecified osteoarthritis, unspecified site: M19.90

## 2021-12-02 LAB — BASIC METABOLIC PANEL
Anion gap: 6 (ref 5–15)
BUN: 15 mg/dL (ref 8–23)
CO2: 25 mmol/L (ref 22–32)
Calcium: 9.3 mg/dL (ref 8.9–10.3)
Chloride: 109 mmol/L (ref 98–111)
Creatinine, Ser: 0.98 mg/dL (ref 0.44–1.00)
GFR, Estimated: >60 mL/min (ref >60)
Glucose, Bld: 157 mg/dL — ABNORMAL HIGH (ref 70–99)
Potassium: 3.3 mmol/L — ABNORMAL LOW (ref 3.5–5.1)
Sodium: 140 mmol/L (ref 135–145)

## 2021-12-03 ENCOUNTER — Other Ambulatory Visit (HOSPITAL_COMMUNITY): Payer: Self-pay

## 2021-12-08 ENCOUNTER — Encounter (HOSPITAL_COMMUNITY)
Admission: RE | Disposition: A | Discharge status: Home / Self Care | Payer: Self-pay | Source: Home / Self Care | Attending: Internal Medicine

## 2021-12-08 ENCOUNTER — Ambulatory Visit (HOSPITAL_COMMUNITY)
Admission: RE | Admit: 2021-12-08 | Discharge: 2021-12-08 | Disposition: A | Discharge status: Home / Self Care | Payer: Medicare Other | Attending: Internal Medicine | Admitting: Internal Medicine

## 2021-12-08 ENCOUNTER — Ambulatory Visit (HOSPITAL_BASED_OUTPATIENT_CLINIC_OR_DEPARTMENT_OTHER): Payer: Medicare Other | Admitting: Anesthesiology

## 2021-12-08 ENCOUNTER — Encounter (HOSPITAL_COMMUNITY): Payer: Self-pay

## 2021-12-08 ENCOUNTER — Ambulatory Visit (HOSPITAL_COMMUNITY): Payer: Medicare Other | Admitting: Anesthesiology

## 2021-12-08 DIAGNOSIS — D123 Benign neoplasm of transverse colon: Secondary | ICD-10-CM | POA: Diagnosis not present

## 2021-12-08 DIAGNOSIS — J449 Chronic obstructive pulmonary disease, unspecified: Secondary | ICD-10-CM | POA: Insufficient documentation

## 2021-12-08 DIAGNOSIS — I252 Old myocardial infarction: Secondary | ICD-10-CM | POA: Insufficient documentation

## 2021-12-08 DIAGNOSIS — Z87891 Personal history of nicotine dependence: Secondary | ICD-10-CM | POA: Diagnosis not present

## 2021-12-08 DIAGNOSIS — D125 Benign neoplasm of sigmoid colon: Secondary | ICD-10-CM | POA: Insufficient documentation

## 2021-12-08 DIAGNOSIS — I10 Essential (primary) hypertension: Secondary | ICD-10-CM | POA: Diagnosis not present

## 2021-12-08 DIAGNOSIS — I2510 Atherosclerotic heart disease of native coronary artery without angina pectoris: Secondary | ICD-10-CM | POA: Diagnosis not present

## 2021-12-08 DIAGNOSIS — K529 Noninfective gastroenteritis and colitis, unspecified: Secondary | ICD-10-CM | POA: Insufficient documentation

## 2021-12-08 DIAGNOSIS — Z1211 Encounter for screening for malignant neoplasm of colon: Secondary | ICD-10-CM | POA: Insufficient documentation

## 2021-12-08 DIAGNOSIS — M1990 Unspecified osteoarthritis, unspecified site: Secondary | ICD-10-CM | POA: Diagnosis not present

## 2021-12-08 DIAGNOSIS — K648 Other hemorrhoids: Secondary | ICD-10-CM | POA: Insufficient documentation

## 2021-12-08 DIAGNOSIS — E119 Type 2 diabetes mellitus without complications: Secondary | ICD-10-CM | POA: Insufficient documentation

## 2021-12-08 DIAGNOSIS — Z79899 Other long term (current) drug therapy: Secondary | ICD-10-CM | POA: Insufficient documentation

## 2021-12-08 DIAGNOSIS — Z955 Presence of coronary angioplasty implant and graft: Secondary | ICD-10-CM | POA: Diagnosis not present

## 2021-12-08 DIAGNOSIS — D120 Benign neoplasm of cecum: Secondary | ICD-10-CM | POA: Insufficient documentation

## 2021-12-08 DIAGNOSIS — K5730 Diverticulosis of large intestine without perforation or abscess without bleeding: Secondary | ICD-10-CM | POA: Diagnosis not present

## 2021-12-08 DIAGNOSIS — D124 Benign neoplasm of descending colon: Secondary | ICD-10-CM | POA: Diagnosis not present

## 2021-12-08 DIAGNOSIS — Z7984 Long term (current) use of oral hypoglycemic drugs: Secondary | ICD-10-CM | POA: Diagnosis not present

## 2021-12-08 DIAGNOSIS — R195 Other fecal abnormalities: Secondary | ICD-10-CM | POA: Diagnosis not present

## 2021-12-08 HISTORY — PX: BIOPSY: SHX5522

## 2021-12-08 HISTORY — PX: HEMOSTASIS CLIP PLACEMENT: SHX6857

## 2021-12-08 HISTORY — PX: POLYPECTOMY: SHX5525

## 2021-12-08 HISTORY — PX: COLONOSCOPY WITH PROPOFOL: SHX5780

## 2021-12-08 LAB — GLUCOSE, CAPILLARY: Glucose-Capillary: 104 mg/dL — ABNORMAL HIGH (ref 70–99)

## 2021-12-08 SURGERY — COLONOSCOPY WITH PROPOFOL
Anesthesia: General

## 2021-12-08 MED ORDER — PHENYLEPHRINE 80 MCG/ML (10ML) SYRINGE FOR IV PUSH (FOR BLOOD PRESSURE SUPPORT)
PREFILLED_SYRINGE | INTRAVENOUS | Status: DC | PRN
  Administered 2021-12-08 (×3): 160 ug via INTRAVENOUS

## 2021-12-08 MED ORDER — PROPOFOL 500 MG/50ML IV EMUL
INTRAVENOUS | Status: DC | PRN
  Administered 2021-12-08: 200 ug/kg/min via INTRAVENOUS

## 2021-12-08 MED ORDER — LIDOCAINE HCL (CARDIAC) PF 100 MG/5ML IV SOSY
PREFILLED_SYRINGE | INTRAVENOUS | Status: DC | PRN
  Administered 2021-12-08: 50 mg via INTRATRACHEAL

## 2021-12-08 MED ORDER — PROPOFOL 10 MG/ML IV BOLUS
INTRAVENOUS | Status: DC | PRN
  Administered 2021-12-08: 80 mg via INTRAVENOUS

## 2021-12-08 MED ORDER — LACTATED RINGERS IV SOLN: INTRAVENOUS | Status: DC

## 2021-12-08 NOTE — Discharge Instructions (Addendum)
Colonoscopy Discharge Instructions  Read the instructions outlined below and refer to this sheet in the next few weeks. These discharge instructions provide you with general information on caring for yourself after you leave the hospital. Your doctor may also give you specific instructions. While your treatment has been planned according to the most current medical practices available, unavoidable complications occasionally occur.   ACTIVITY You may resume your regular activity, but move at a slower pace for the next 24 hours.  Take frequent rest periods for the next 24 hours.  Walking will help get rid of the air and reduce the bloated feeling in your belly (abdomen).  No driving for 24 hours (because of the medicine (anesthesia) used during the test).   Do not sign any important legal documents or operate any machinery for 24 hours (because of the anesthesia used during the test).  NUTRITION Drink plenty of fluids.  You may resume your normal diet as instructed by your doctor.  Begin with a light meal and progress to your normal diet. Heavy or fried foods are harder to digest and may make you feel sick to your stomach (nauseated).  Avoid alcoholic beverages for 24 hours or as instructed.  MEDICATIONS You may resume your normal medications unless your doctor tells you otherwise.  WHAT YOU CAN EXPECT TODAY Some feelings of bloating in the abdomen.  Passage of more gas than usual.  Spotting of blood in your stool or on the toilet paper.  IF YOU HAD POLYPS REMOVED DURING THE COLONOSCOPY: No aspirin products for 7 days or as instructed.  No alcohol for 7 days or as instructed.  Eat a soft diet for the next 24 hours.  FINDING OUT THE RESULTS OF YOUR TEST Not all test results are available during your visit. If your test results are not back during the visit, make an appointment with your caregiver to find out the results. Do not assume everything is normal if you have not heard from your  caregiver or the medical facility. It is important for you to follow up on all of your test results.  SEEK IMMEDIATE MEDICAL ATTENTION IF: You have more than a spotting of blood in your stool.  Your belly is swollen (abdominal distention).  You are nauseated or vomiting.  You have a temperature over 101.  You have abdominal pain or discomfort that is severe or gets worse throughout the day.   Your colonoscopy revealed 10 polyp(s) which I removed successfully.  One was on the larger side..  The large polyp bled after removal.  I placed 3 metallic clips on it and stopped the bleeding.  These should naturally fall off over the next 2 to 3 months.  If you need an MRI for any reason in the near future you will need x-ray prior as these are metallic.  Await pathology results, my office will contact you. I recommend repeating colonoscopy in 3 years for surveillance purposes.   You also have diverticulosis and internal hemorrhoids. I would recommend increasing fiber in your diet or adding OTC Benefiber/Metamucil. Be sure to drink at least 4 to 6 glasses of water daily.   Mild amount of inflammation in the left side your colon.  This is typically due to the colon prep itself.  I did take samples of it however.  We will call with these results.  Restart Plavix tomorrow   I hope you have a great rest of your week!  Elon Alas. Abbey Chatters, D.O. Gastroenterology and Hepatology  [facility]

## 2021-12-08 NOTE — Transfer of Care (Signed)
Immediate Anesthesia Transfer of Care Note  Patient: Shelley [name]  Procedure(s) Performed: COLONOSCOPY WITH PROPOFOL POLYPECTOMY HEMOSTASIS CLIP PLACEMENT BIOPSY  Patient Location: PACU  Anesthesia Type:General  Level of Consciousness: sedated, patient cooperative, and responds to stimulation  Airway & Oxygen Therapy: Patient Spontanous Breathing  Post-op Assessment: Report given to RN, Post -op Vital signs reviewed and stable, and Patient moving all extremities  Post vital signs: Reviewed and stable  Last Vitals:  Vitals Value Taken Time  BP    Temp    Pulse    Resp    SpO2      Last Pain:  Vitals:   12/08/21 1049  TempSrc:   PainSc: 0-No pain      Patients Stated Pain Goal: 3 (97/28/20 6015)  Complications: No notable events documented.

## 2021-12-08 NOTE — Anesthesia Preprocedure Evaluation (Addendum)
Anesthesia Evaluation  Patient identified by MRN, date of birth, ID band Patient awake    Reviewed: Allergy & Precautions, H&P , NPO status , Patient's Chart, lab work & pertinent test results, reviewed documented beta blocker date and time   Airway Mallampati: II  TM Distance: >3 FB Neck ROM: Full    Dental  (+) Edentulous Upper, Edentulous Lower   Pulmonary COPD, former smoker   Pulmonary exam normal breath sounds clear to auscultation       Cardiovascular hypertension, Pt. on home beta blockers and Pt. on medications + CAD, + Past MI and + Cardiac Stents  Normal cardiovascular exam Rhythm:Regular Rate:Normal     Neuro/Psych negative neurological ROS  negative psych ROS   GI/Hepatic negative GI ROS, Neg liver ROS,,,  Endo/Other  diabetes, Well Controlled, Type 2, Oral Hypoglycemic AgentsHypothyroidism    Renal/GU negative Renal ROS  negative genitourinary   Musculoskeletal  (+) Arthritis , Osteoarthritis,    Abdominal   Peds negative pediatric ROS (+)  Hematology negative hematology ROS (+)   Anesthesia Other Findings   Reproductive/Obstetrics negative OB ROS                             Anesthesia Physical Anesthesia Plan  ASA: 3  Anesthesia Plan: General   Post-op Pain Management: Minimal or no pain anticipated   Induction: Intravenous  PONV Risk Score and Plan: Propofol infusion  Airway Management Planned: Nasal Cannula and Natural Airway  Additional Equipment:   Intra-op Plan:   Post-operative Plan:   Informed Consent: I have reviewed the patients History and Physical, chart, labs and discussed the procedure including the risks, benefits and alternatives for the proposed anesthesia with the patient or authorized representative who has indicated his/her understanding and acceptance.       Plan Discussed with: CRNA and Surgeon  Anesthesia Plan Comments:         Anesthesia Quick Evaluation

## 2021-12-08 NOTE — Anesthesia Postprocedure Evaluation (Signed)
Anesthesia Post Note  Patient: Shelley [name]  Procedure(s) Performed: COLONOSCOPY WITH PROPOFOL POLYPECTOMY HEMOSTASIS CLIP PLACEMENT BIOPSY  Patient location during evaluation: Phase II Anesthesia Type: General Level of consciousness: awake and alert and oriented Pain management: pain level controlled Vital Signs Assessment: post-procedure vital signs reviewed and stable Respiratory status: spontaneous breathing, nonlabored ventilation and respiratory function stable Cardiovascular status: blood pressure returned to baseline and stable Postop Assessment: no apparent nausea or vomiting Anesthetic complications: no  No notable events documented.   Last Vitals:  Vitals:   12/08/21 1127 12/08/21 1132  BP:  101/68  Pulse: 73   Resp: 20   Temp:    SpO2: 98%     Last Pain:  Vitals:   12/08/21 1127  TempSrc:   PainSc: 0-No pain                 Summer Mccolgan C Durell Lofaso

## 2021-12-08 NOTE — Interval H&P Note (Signed)
History and Physical Interval Note:  12/08/2021 10:24 AM  Shelley [name]  has presented today for surgery, with the diagnosis of positive cologuard.  The various methods of treatment have been discussed with the patient and family. After consideration of risks, benefits and other options for treatment, the patient has consented to  Procedure(s) with comments: COLONOSCOPY WITH PROPOFOL (N/A) - 2:30pm, asa 3,pt knows to arrive at 9:45 as a surgical intervention.  The patient's history has been reviewed, patient examined, no change in status, stable for surgery.  I have reviewed the patient's chart and labs.  Questions were answered to the patient's satisfaction.     Eloise Harman

## 2021-12-08 NOTE — Op Note (Addendum)
[facility] Patient Name: Shelley [name] Procedure Date: 12/08/2021 10:36 AM MRN: [id number] Date of Birth: 1955/08/18 Attending MD: Elon Alas. Abbey Chatters , [place], [id number] CSN: [id number] Age: 66 Admit Type: Outpatient Procedure:                Colonoscopy Indications:              Positive Cologuard test Providers:                Elon Alas. Abbey Chatters, DO, Hughie Closs RN, RN,                            Suzan Garibaldi. Risa Grill, Technician Referring MD:              Medicines:                See the Anesthesia note for documentation of the                            administered medications Complications:            No immediate complications. Estimated Blood Loss:     Estimated blood loss was minimal. Procedure:                Pre-Anesthesia Assessment:                           - The anesthesia plan was to use monitored                            anesthesia care (MAC).                           After obtaining informed consent, the colonoscope                            was passed under direct vision. Throughout the                            procedure, the patient's blood pressure, pulse, and                            oxygen saturations were monitored continuously. The                            [id number]-HQ190L ([id number]) scope was introduced through                            the anus and advanced to the the cecum, identified                            by appendiceal orifice and ileocecal valve. The                            colonoscopy was performed without difficulty. The                            patient tolerated the procedure  well. The quality                            of the bowel preparation was evaluated using the                            BBPS [facility] Bowel Preparation Scale) with scores                            of: Right Colon = 3, Transverse Colon = 3 and Left                            Colon = 3 (entire mucosa seen well with no residual                             staining, small fragments of stool or opaque                            liquid). The total BBPS score equals 9. Scope In: 10:53:44 AM Scope Out: 11:23:08 AM Scope Withdrawal Time: 0 hours 25 minutes 20 seconds  Total Procedure Duration: 0 hours 29 minutes 24 seconds  Findings:      The perianal and digital rectal examinations were normal.      Non-bleeding internal hemorrhoids were found during endoscopy.      Multiple medium-mouthed diverticula were found in the sigmoid colon.      A 15 mm polyp was found in the cecum. The polyp was semi-pedunculated.       The polyp was removed with a cold snare. Resection and retrieval were       complete. For hemostasis, three hemostatic clips were successfully       placed (MR conditional). Clip manufacturer: [employer]. There was       no bleeding at the end of the procedure.      Five sessile polyps were found in the descending colon and transverse       colon. The polyps were 4 to 8 mm in size. These polyps were removed with       a cold snare. Resection and retrieval were complete.      Four sessile polyps were found in the sigmoid colon. The polyps were 4       to 6 mm in size. These polyps were removed with a cold snare. Resection       and retrieval were complete.      Patchy mild inflammation vs prep artifact characterized by erythema was       found in the sigmoid colon. Biopsies were taken with a cold forceps for       histology. Impression:               - Non-bleeding internal hemorrhoids.                           - Diverticulosis in the sigmoid colon.                           - One 15 mm polyp in the cecum, removed with a cold  snare. Resected and retrieved. Clips (MR                            conditional) were placed. Clip manufacturer: [employer].                           - Five 4 to 8 mm polyps in the descending colon and                            in the  transverse colon, removed with a cold snare.                            Resected and retrieved.                           - Four 4 to 6 mm polyps in the sigmoid colon,                            removed with a cold snare. Resected and retrieved.                           - Patchy mild inflammation was found in the sigmoid                            colon. Biopsied. Moderate Sedation:      Per Anesthesia Care Recommendation:           - Patient has a contact number available for                            emergencies. The signs and symptoms of potential                            delayed complications were discussed with the                            patient. Return to normal activities tomorrow.                            Written discharge instructions were provided to the                            patient.                           - Resume previous diet.                           - Continue present medications.                           - Await pathology results.                           -  Repeat colonoscopy in 3 years for surveillance.                           - Return to GI clinic at appointment to be                            scheduled pending pathology results                           - PATIENT WILL NEED KUB PRIOR TO ANY MRI DONE IN                            THE FUTURE DUE TO CLIPS PLACED TODAY Procedure Code(s):        --- Professional ---                           [phone], Colonoscopy, flexible; with removal of                            tumor(s), polyp(s), or other lesion(s) by snare                            technique                           45380, 59, Colonoscopy, flexible; with biopsy,                            single or multiple Diagnosis Code(s):        --- Professional ---                           K64.8, Other hemorrhoids                           D12.0, Benign neoplasm of cecum                           D12.4, Benign neoplasm of descending colon                            D12.3, Benign neoplasm of transverse colon (hepatic                            flexure or splenic flexure)                           D12.5, Benign neoplasm of sigmoid colon                           K52.9, Noninfective gastroenteritis and colitis,                            unspecified                           R19.5, Other  fecal abnormalities                           K57.30, Diverticulosis of large intestine without                            perforation or abscess without bleeding CPT copyright 2022 American Medical Association. All rights reserved. The codes documented in this report are preliminary and upon coder review may  be revised to meet current compliance requirements. Elon Alas. Abbey Chatters, DO [place] Abbey Chatters, DO 12/08/2021 11:28:16 AM This report has been signed electronically. Number of Addenda: 0

## 2021-12-09 LAB — SURGICAL PATHOLOGY

## 2021-12-15 ENCOUNTER — Encounter (HOSPITAL_COMMUNITY): Payer: Self-pay | Admitting: Internal Medicine

## 2021-12-30 ENCOUNTER — Other Ambulatory Visit (HOSPITAL_COMMUNITY): Payer: Self-pay

## 2022-01-07 ENCOUNTER — Other Ambulatory Visit: Payer: Self-pay | Admitting: Cardiovascular Disease

## 2022-01-08 ENCOUNTER — Other Ambulatory Visit (HOSPITAL_COMMUNITY): Payer: Self-pay

## 2022-01-08 MED ORDER — CLOPIDOGREL BISULFATE 75 MG PO TABS
75.0000 mg | ORAL_TABLET | Freq: Every day | ORAL | 0 refills | Status: DC
  Filled 2022-01-08: qty 30, 30d supply, fill #0

## 2022-01-08 NOTE — Telephone Encounter (Signed)
Spoke with patient about overdue follow up and medication refill request we received. Got patient scheduled for an office visit, will refill medication

## 2022-01-15 ENCOUNTER — Encounter: Payer: Self-pay | Admitting: Nurse Practitioner

## 2022-01-15 ENCOUNTER — Ambulatory Visit: Payer: Medicare Other | Attending: Nurse Practitioner | Admitting: Nurse Practitioner

## 2022-01-15 VITALS — BP 124/80 | HR 80 | Ht 65.0 in | Wt 182.4 lb

## 2022-01-15 DIAGNOSIS — E669 Obesity, unspecified: Secondary | ICD-10-CM

## 2022-01-15 DIAGNOSIS — E785 Hyperlipidemia, unspecified: Secondary | ICD-10-CM

## 2022-01-15 DIAGNOSIS — I2510 Atherosclerotic heart disease of native coronary artery without angina pectoris: Secondary | ICD-10-CM | POA: Diagnosis not present

## 2022-01-15 DIAGNOSIS — I10 Essential (primary) hypertension: Secondary | ICD-10-CM | POA: Diagnosis not present

## 2022-01-15 DIAGNOSIS — E119 Type 2 diabetes mellitus without complications: Secondary | ICD-10-CM | POA: Diagnosis not present

## 2022-01-15 DIAGNOSIS — E66811 Obesity, class 1: Secondary | ICD-10-CM

## 2022-01-15 NOTE — Patient Instructions (Signed)
Medication Instructions:  Your physician recommends that you continue on your current medications as directed. Please refer to the Current Medication list given to you today.   *If you need a refill on your cardiac medications before your next appointment, please call your pharmacy*   Lab Work: NONE ordered at this time of appointment   If you have labs (blood work) drawn today and your tests are completely normal, you will receive your results only by: MyChart Message (if you have MyChart) OR A paper copy in the mail If you have any lab test that is abnormal or we need to change your treatment, we will call you to review the results.   Testing/Procedures: NONE ordered at this time of appointment     Follow-Up: At [facility], you and your health needs are our priority.  As part of our continuing mission to provide you with exceptional heart care, we have created designated Provider Care Teams.  These Care Teams include your primary Cardiologist (physician) and Advanced Practice Providers (APPs -  Physician Assistants and Nurse Practitioners) who all work together to provide you with the care you need, when you need it.  We recommend signing up for the patient portal called "MyChart".  Sign up information is provided on this After Visit Summary.  MyChart is used to connect with patients for Virtual Visits (Telemedicine).  Patients are able to view lab/test results, encounter notes, upcoming appointments, etc.  Non-urgent messages can be sent to your provider as well.   To learn more about what you can do with MyChart, go to [email or web address].    Your next appointment:   1 year(s)  Provider:   Mihai Croitoru, MD     Other Instructions   

## 2022-01-15 NOTE — Progress Notes (Signed)
Office Visit    Patient Name: ELYSSE [name] Date of Encounter: 01/15/2022  Primary Care Provider:  Denyce [name], [place] Primary Cardiologist:  Shelley Klein, MD  Chief Complaint    67 year old female with a history of premature onset CAD s/p BMS-LAD in 2001, DES-LAD in 2008, DES-RCA in 2008, DES-RCA in 2016, s/p STEMI, DES-pRCA with staged DES-dRCA in 07/2015, hypertension, hyperlipidemia, type 2 diabetes, and obesity who presents for follow-up related to CAD.  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    Diabetes mellitus    Dyslipidemia    Hypertension    MI (myocardial infarction) ([place])    Shingles    Thyroid disease    Past Surgical History:  Procedure Laterality Date   BACK SURGERY  5/99 & 6/99   BIOPSY  12/08/2021   Procedure: BIOPSY;  Surgeon: Shelley Harman, DO;  [facility];  Service: Endoscopy;;   CARDIAC CATHETERIZATION  01/12/2002   stent patent   CARDIAC CATHETERIZATION  09/03/2006   patent stents   CARDIAC CATHETERIZATION N/A 07/31/2015   Procedure: Left Heart Cath and Coronary Angiography;  Surgeon: Shelley Blanks, MD;  Location: [place] [facility];  Service: Cardiovascular;  Laterality: N/A;   CARDIAC CATHETERIZATION N/A 07/31/2015   Procedure: Coronary Stent Intervention;  Surgeon: Shelley Blanks, MD;  Location: [place] [facility];  Service: Cardiovascular;  Laterality: N/A;   CARDIAC CATHETERIZATION N/A 08/02/2015   Procedure: Coronary Stent Intervention;  Surgeon: Shelley Man, MD;  Location: [place] [facility];  Service: Cardiovascular;  Laterality: N/A;   COLONOSCOPY WITH PROPOFOL N/A 12/08/2021   Procedure: COLONOSCOPY WITH PROPOFOL;  Surgeon: Shelley Harman, DO;  [facility];  Service: Endoscopy;  Laterality: N/A;  2:30pm, asa 3,pt knows to arrive at [place]  07/17/1998   stent LAD   CORONARY ANGIOPLASTY WITH STENT PLACEMENT  01/07/2006   stent RCA,LAD stent patent   CORONARY ANGIOPLASTY WITH STENT PLACEMENT  03/18/2006   stent to mid RCA,prox.RCA and LAD stent patent   CRYOTHERAPY     ESOPHAGOGASTRODUODENOSCOPY  2014   Gastritis   HEMOSTASIS CLIP PLACEMENT  12/08/2021   Procedure: HEMOSTASIS CLIP PLACEMENT;  Surgeon: Shelley Harman, DO;  [facility];  Service: Endoscopy;;   POLYPECTOMY  12/08/2021   Procedure: POLYPECTOMY;  Surgeon: Shelley Harman, DO;  [facility];  Service: Endoscopy;;   THYROID SURGERY     PT. DENIES/   just radiation RADIATION   TOTAL HIP ARTHROPLASTY Right 12/21/2017   Procedure: TOTAL HIP ARTHROPLASTY ANTERIOR APPROACH;  Surgeon: Shelley Cancel, MD;  Location: [facility];  Service: Orthopedics;  Laterality: Right;  70 mins   TUBAL LIGATION      Allergies  Allergies  Allergen Reactions   Coreg [Carvedilol] Shortness Of Breath   Levothyroxine Itching    Patient thinks it was the dye in the tablets she was allergic to, she feels like "something is crawling under my skin"   Lopressor [Metoprolol Tartrate] Cough    Fatigue    Metformin And Related Diarrhea   Norvasc [Amlodipine Besylate] Palpitations    "made my heart race"     Labs/Other Studies Reviewed    The following studies were reviewed today: Coronary stent intervention 07/2015: Dist RCA lesion, 80 %  stenosed. - for planned PCI. A STENT PROMUS PREM MR 3.0X16 drug eluting stent was successfully placed, and does not overlap previously placed stent. Post intervention, there is a 0% residual stenosis. Recent Prox RCA Promus Premier DES stent, 0 %stenosed. Prox RCA to Mid RCA lesion, 10 %stenosed - in-stent restenosis   Successful distal RCA PCI with a single DES stent with excellent result.   Plan: Transferred to 6 [facility] post procedure unit for ongoing care and TR band removal. Continue dual therapy, would  consider a Thienopyridine Antiplatelet agent lifelong. Unfortunately she has intolerances of many cardiac medications such as beta blockers and calcium channel blockers. Defer to primary rounding service and primary cardiologist.   Echo 08/26/2015: Study Conclusions   - Left ventricle: The cavity size was normal. Systolic function was    normal. The estimated ejection fraction was in the range of 60%    to 65%. Wall motion was normal; there were no regional wall    motion abnormalities. Doppler parameters are consistent with    abnormal left ventricular relaxation (grade 1 diastolic    dysfunction).  - Atrial septum: No defect or patent foramen ovale was identified.   Recent Labs: 12/02/2021: BUN 15; Creatinine, Ser 0.98; Potassium 3.3; Sodium 140  Recent Lipid Panel    Component Value Date/Time   CHOL 209 (H) 08/03/2015 0450   TRIG 105 08/03/2015 0450   HDL 32 (L) 08/03/2015 0450   CHOLHDL 6.5 08/03/2015 0450   VLDL 21 08/03/2015 0450   LDLCALC 156 (H) 08/03/2015 0450    History of Present Illness    67 year old female with the above past medical history including premature onset CAD s/p BMS-LAD in 2001, DES-LAD in 2008, DES-RCA in 2008, DES-RCA in 2016, s/p STEMI, DES-pRCA with staged DES-dRCA in 07/2015, hypertension, hyperlipidemia, type 2 diabetes, reactive airway disease, and obesity.  She has a history of extensive premature CAD as above.  She is on lifelong Plavix.  She was last seen in the office on 02/04/2021 and was stable from a cardiac standpoint.  She denied symptoms concerning for angina.   She presents today for follow-up.  Since her last visit she has done well from a cardiac standpoint.  She has retired within the last year.  She remains active and continues to participate in competitive bowling.  She denies symptoms concerning for angina.  Overall, she reports feeling well.  Home Medications    Current Outpatient Medications  Medication Sig Dispense Refill    ACCU-CHEK FASTCLIX LANCETS MISC   3   ACCU-CHEK GUIDE test strip   3   ASPIRIN ADULT LOW STRENGTH 81 MG EC tablet TAKE 1 TABLET BY MOUTH ONCE DAILY 30 tablet 3   bisoprolol (ZEBETA) 5 MG tablet Take 0.5 tablets (2.5 mg total) by mouth daily. 45 tablet 0   clobetasol (TEMOVATE) 0.05 % external solution Apply 1 Application topically 2 (two) times daily as needed (dry scalp).     clopidogrel (PLAVIX) 75 MG tablet Take 1 tablet (75 mg total) by mouth daily. 30 tablet 0   doxepin (SINEQUAN) 10 MG capsule 1 capsule at bedtime Orally prn     empagliflozin (JARDIANCE) 25 MG TABS tablet Take 1 tablet (25 mg total) by mouth in the morning. 90 tablet 5   ezetimibe (ZETIA) 10 MG tablet Take 1 tablet (10 mg total) by mouth daily. 90 tablet 3   fexofenadine (ALLEGRA) 180 MG tablet Take 180 mg by mouth 2 (two) times daily.     fluticasone (  FLONASE) 50 MCG/ACT nasal spray Place 1 spray into both nostrils daily as needed for allergies or rhinitis.     glimepiride (AMARYL) 1 MG tablet Take 1 tablet (1 mg total) by mouth daily with breakfast or the first meal of the day 90 tablet 4   hydrOXYzine (ATARAX) 25 MG tablet Take 1 tablet (25 mg total) by mouth daily as needed for itching. (Patient taking differently: Take 25 mg by mouth at bedtime.) 30 tablet 2   irbesartan-hydrochlorothiazide (AVALIDE) 150-12.5 MG tablet Take 1 tablet by mouth daily. 30 tablet 6   levothyroxine (SYNTHROID) 75 MCG tablet Take 1 tablet (75 mcg total) by mouth in the morning on an empty stomach 30 tablet 4   montelukast (SINGULAIR) 10 MG tablet Take 1 tablet (10 mg total) by mouth at bedtime. 90 tablet 3   potassium chloride (KLOR-CON M) 10 MEQ tablet Take 1 tablet (10 mEq total) by mouth daily. 90 tablet 3   rosuvastatin (CRESTOR) 40 MG tablet Take 1 tablet (40 mg total) by mouth at bedtime for cholesterol 90 tablet 3   No current facility-administered medications for this visit.     Review of Systems    She denies chest pain,  palpitations, dyspnea, pnd, orthopnea, n, v, dizziness, syncope, edema, weight gain, or early satiety. All other systems reviewed and are otherwise negative except as noted above.   Physical Exam    VS:  BP 124/80   Pulse 80   Ht '5\' 5"'$  (1.651 m)   Wt 182 lb 6.4 oz (82.7 kg)   LMP 01/06/2003 (Approximate)   SpO2 96%   BMI 30.35 kg/m   GEN: Well nourished, well developed, in no acute distress. HEENT: normal. Neck: Supple, no JVD, carotid bruits, or masses. Cardiac: RRR, no murmurs, rubs, or gallops. No clubbing, cyanosis, edema.  Radials/DP/PT 2+ and equal bilaterally.  Respiratory:  Respirations regular and unlabored, clear to auscultation bilaterally. GI: Soft, nontender, nondistended, BS + x 4. MS: no deformity or atrophy. Skin: warm and dry, no rash. Neuro:  Strength and sensation are intact. Psych: Normal affect.  Accessory Clinical Findings    ECG personally reviewed by me today -NSR, 80 bpm, sinus arrhythmia, nonspecific ST/T wave abnormality- no acute changes.   Lab Results  Component Value Date   WBC 13.0 (H) 12/22/2017   HGB 12.6 12/22/2017   HCT 39.3 12/22/2017   MCV 88.7 12/22/2017   PLT 241 12/22/2017   Lab Results  Component Value Date   CREATININE 0.98 12/02/2021   BUN 15 12/02/2021   NA 140 12/02/2021   K 3.3 (L) 12/02/2021   CL 109 12/02/2021   CO2 25 12/02/2021   Lab Results  Component Value Date   ALT 32 08/23/2013   AST 31 08/23/2013   ALKPHOS 140 (H) 08/23/2013   BILITOT 0.4 08/23/2013   Lab Results  Component Value Date   CHOL 209 (H) 08/03/2015   HDL 32 (L) 08/03/2015   LDLCALC 156 (H) 08/03/2015   TRIG 105 08/03/2015   CHOLHDL 6.5 08/03/2015    Lab Results  Component Value Date   HGBA1C (H) 09/03/2006    6.3 (NOTE)   The ADA recommends the following therapeutic goals for glycemic   control related to Hgb A1C measurement:   Goal of Therapy:   < 7.0% Hgb A1C   Action Suggested:  > 8.0% Hgb A1C   Ref:  Diabetes Care, 22, Suppl. 1, 1999     Assessment & Plan    1.  CAD: S/p BMS-LAD in 2001, DES-LAD in 2008, DES-RCA in 2008, [place] in 2016, s/p STEMI, DES-pRCA with staged DES-dRCA in 07/2015. Stable with no anginal symptoms. No indication for ischemic evaluation.  Continue aspirin, Plavix, bisoprolol, irbesartan-hydrochlorothiazide, Crestor, and Zetia.  2. Hypertension: BP well controlled. Continue current antihypertensive regimen.   3. Hyperlipidemia: She recently had labs with her PCP, records not available for review today.  Will request labs from PCP.  Continue aspirin, Plavix, Crestor, Zetia.  4. Type 2 diabetes: A1c was 6.6 in 12/2021.  Follows with endocrinology.  5. Obesity: She continues to work on lifestyle modifications with diet and exercise.  6. Disposition: Follow-up in 1 year.    Lenna Sciara, NP 01/15/2022, 7:01 PM

## 2022-01-22 ENCOUNTER — Other Ambulatory Visit (HOSPITAL_COMMUNITY): Payer: Self-pay

## 2022-01-28 ENCOUNTER — Other Ambulatory Visit (HOSPITAL_COMMUNITY): Payer: Self-pay

## 2022-02-03 ENCOUNTER — Other Ambulatory Visit: Payer: Self-pay | Admitting: Cardiovascular Disease

## 2022-02-03 ENCOUNTER — Other Ambulatory Visit: Payer: Self-pay

## 2022-02-04 ENCOUNTER — Other Ambulatory Visit (HOSPITAL_COMMUNITY): Payer: Self-pay

## 2022-02-04 MED ORDER — BISOPROLOL FUMARATE 5 MG PO TABS
2.5000 mg | ORAL_TABLET | Freq: Every day | ORAL | 11 refills | Status: DC
  Filled 2022-02-04: qty 45, 90d supply, fill #0
  Filled 2022-05-07: qty 45, 90d supply, fill #1
  Filled 2022-08-17: qty 45, 90d supply, fill #2
  Filled 2022-11-10: qty 45, 90d supply, fill #3

## 2022-02-05 ENCOUNTER — Other Ambulatory Visit (HOSPITAL_COMMUNITY): Payer: Self-pay

## 2022-02-05 MED ORDER — HYDROXYZINE HCL 25 MG PO TABS
25.0000 mg | ORAL_TABLET | Freq: Every day | ORAL | 2 refills | Status: AC | PRN
  Filled 2022-02-05: qty 30, 30d supply, fill #0

## 2022-02-10 ENCOUNTER — Other Ambulatory Visit (HOSPITAL_COMMUNITY): Payer: Self-pay

## 2022-02-10 ENCOUNTER — Other Ambulatory Visit: Payer: Self-pay

## 2022-02-10 ENCOUNTER — Other Ambulatory Visit: Payer: Self-pay | Admitting: Cardiovascular Disease

## 2022-02-10 MED ORDER — CLOPIDOGREL BISULFATE 75 MG PO TABS
75.0000 mg | ORAL_TABLET | Freq: Every day | ORAL | 0 refills | Status: DC
  Filled 2022-02-10: qty 30, 30d supply, fill #0

## 2022-02-16 ENCOUNTER — Other Ambulatory Visit (HOSPITAL_COMMUNITY): Payer: Self-pay

## 2022-02-16 ENCOUNTER — Other Ambulatory Visit: Payer: Self-pay | Admitting: Pulmonary Disease

## 2022-02-16 MED ORDER — MONTELUKAST SODIUM 10 MG PO TABS
10.0000 mg | ORAL_TABLET | Freq: Every day | ORAL | 0 refills | Status: DC
  Filled 2022-02-16: qty 90, 90d supply, fill #0

## 2022-02-17 ENCOUNTER — Other Ambulatory Visit: Payer: Self-pay | Admitting: Cardiovascular Disease

## 2022-02-17 ENCOUNTER — Other Ambulatory Visit (HOSPITAL_COMMUNITY): Payer: Self-pay

## 2022-02-17 MED ORDER — EZETIMIBE 10 MG PO TABS
10.0000 mg | ORAL_TABLET | Freq: Every day | ORAL | 3 refills | Status: DC
  Filled 2022-02-17: qty 90, 90d supply, fill #0
  Filled 2022-05-26: qty 90, 90d supply, fill #1
  Filled 2022-08-26: qty 90, 90d supply, fill #2
  Filled 2022-12-01: qty 90, 90d supply, fill #3

## 2022-03-10 ENCOUNTER — Other Ambulatory Visit: Payer: Self-pay | Admitting: Cardiovascular Disease

## 2022-03-10 ENCOUNTER — Other Ambulatory Visit (HOSPITAL_COMMUNITY): Payer: Self-pay

## 2022-03-10 MED ORDER — CLOPIDOGREL BISULFATE 75 MG PO TABS
75.0000 mg | ORAL_TABLET | Freq: Every day | ORAL | 0 refills | Status: DC
  Filled 2022-03-10: qty 30, 30d supply, fill #0

## 2022-03-31 ENCOUNTER — Other Ambulatory Visit (HOSPITAL_COMMUNITY): Payer: Self-pay

## 2022-03-31 ENCOUNTER — Other Ambulatory Visit: Payer: Self-pay

## 2022-04-06 ENCOUNTER — Other Ambulatory Visit (HOSPITAL_COMMUNITY): Payer: Self-pay

## 2022-04-07 ENCOUNTER — Other Ambulatory Visit (HOSPITAL_COMMUNITY): Payer: Self-pay

## 2022-04-07 MED ORDER — LEVOTHYROXINE SODIUM 75 MCG PO TABS
75.0000 ug | ORAL_TABLET | Freq: Every morning | ORAL | 4 refills | Status: DC
  Filled 2022-04-07: qty 30, 30d supply, fill #0
  Filled 2022-05-07: qty 30, 30d supply, fill #1
  Filled 2022-06-02: qty 30, 30d supply, fill #2
  Filled 2022-07-10: qty 30, 30d supply, fill #3
  Filled 2022-08-05: qty 30, 30d supply, fill #4

## 2022-04-13 ENCOUNTER — Other Ambulatory Visit: Payer: Self-pay | Admitting: Cardiovascular Disease

## 2022-04-14 ENCOUNTER — Other Ambulatory Visit (HOSPITAL_COMMUNITY): Payer: Self-pay

## 2022-04-14 MED ORDER — CLOPIDOGREL BISULFATE 75 MG PO TABS
75.0000 mg | ORAL_TABLET | Freq: Every day | ORAL | 0 refills | Status: DC
  Filled 2022-04-14: qty 30, 30d supply, fill #0

## 2022-04-20 ENCOUNTER — Ambulatory Visit (HOSPITAL_BASED_OUTPATIENT_CLINIC_OR_DEPARTMENT_OTHER): Payer: Medicare Other | Admitting: Pulmonary Disease

## 2022-04-20 ENCOUNTER — Encounter (HOSPITAL_BASED_OUTPATIENT_CLINIC_OR_DEPARTMENT_OTHER): Payer: Self-pay | Admitting: Pulmonary Disease

## 2022-04-20 VITALS — BP 114/72 | HR 80 | Temp 98.5°F | Ht 65.0 in | Wt 181.2 lb

## 2022-04-20 DIAGNOSIS — J449 Chronic obstructive pulmonary disease, unspecified: Secondary | ICD-10-CM

## 2022-04-20 NOTE — Patient Instructions (Signed)
You last got the pneumonia 23 vaccine in 2019.  You will need to get the pneumonia 20 vaccine later in the Fall, and then you will be done with your pneumonia vaccinations.  Follow up in 1 year.

## 2022-04-20 NOTE — Progress Notes (Signed)
[facility]  Chief Complaint  Patient presents with   Follow-up    Follow up. Patient has no complaints.     Past Surgical History:  She  has a past surgical history that includes Back surgery (5/99 & 6/99); Coronary angioplasty with stent (07/17/1998); Cardiac catheterization (01/12/2002); Coronary angioplasty with stent (01/07/2006); Coronary angioplasty with stent (03/18/2006); Cardiac catheterization (09/03/2006); Thyroid surgery; Tubal ligation; Cryotherapy; Cardiac catheterization (N/A, 07/31/2015); Cardiac catheterization (N/A, 07/31/2015); Cardiac catheterization (N/A, 08/02/2015); Total hip arthroplasty (Right, 12/21/2017); Esophagogastroduodenoscopy (2014); Colonoscopy with propofol (N/A, 12/08/2021); polypectomy (12/08/2021); Hemostasis clip placement (12/08/2021); and biopsy (12/08/2021).  Past Medical History:  Hypothyroidism, Shingles, CAD s/p stent, HTN, DM type 2, HLD  Constitutional:  BP 114/72 (BP Location: Right Arm, Patient Position: Sitting, Cuff Size: Normal)   Pulse 80   Temp 98.5 F (36.9 C) (Oral)   Ht  (1.651 m)   Wt 181 lb 3.2 oz (82.2 kg)   LMP 01/06/2003 (Approximate)   SpO2 96%   BMI 30.15 kg/m   Brief Summary:  Shelley [name] is a 67 y.o. 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      Subjective:   Breathing has been doing well.  Not having sinus congestion, cough, post nasal drip, chest congestion, or wheezing.  Doesn't feel like her breathing limits her activity level.  Sleeping okay.  Hasn't needed symbicort or albuterol.  Asked questions about her vaccination schedule.  Physical Exam:   Appearance - well kempt   ENMT - no sinus tenderness, no oral exudate, no LAN, Mallampati 4 airway, no stridor, wears dentures  Respiratory - equal breath sounds bilaterally, no wheezing or rales  CV - s1s2 regular rate and rhythm, no murmurs  Ext - no clubbing, no edema  Skin - no  rashes  Psych - normal mood and affect   Pulmonary testing:  FeNO 06/02/17 >> 59 Spirometry 06/02/17 >> FEV1 1.18 (55%), FEV1% 56 A1AT 08/23/17 >> 129, EM PFT 08/23/17 >> FEV1 1.31 (62%), FEV1% 59, TLC 5.05 (98%), DLCO 43%, no BD  Chest Imaging:  CT angio chest 08/21/15 >> centrilobular emphysema, 5 mm nodule LLL stable since 2014  Cardiac Tests:  Echo 08/26/15 >> EF 60 to 65%, grade 1 DD  Social History:  She  reports that she quit smoking about 16 years ago. Her smoking use included cigarettes. She has a 17.00 pack-year smoking history. She quit smokeless tobacco use about 17 years ago. She reports that she does not drink alcohol and does not use drugs.  Family History:  Her family history includes Esophageal cancer in her maternal aunt; Hypertension in her mother; Prostate cancer in her maternal uncle; Thyroid disease in her mother.     Assessment/Plan:   COPD with asthma and emphysema. - continue singulair 10 mg qhs - she doesn't feel like she needs additional inhaler medication at this time  Vaccinations. - discussed planning for RSV and Influenza vaccinations in the Fall - will need to wait for updated guidelines from the [facility] for COVID vaccination schedule - she received the pneumonia 23 vaccine in 2019 >> she will need the pneumonia 20 vaccine later this year and the will be done with pneumonia vaccination  Perennial allergic rhinitis. - continue singulair - prn flonase, allegra  Coronary artery disease. - followed by Dr. Rachelle Hora Croituru and Micah Flesher with Cardiology  Hypothyroidism, Diabetes mellitus type 2. - followed by Dr. Mauri Brooklyn with Endocrinology  Time Spent Involved  in Patient Care on Day of Examination:  27 minutes  Follow up:   Patient Instructions  You last got the pneumonia 23 vaccine in 2019.  You will need to get the pneumonia 20 vaccine later in the Fall, and then you will be done with your pneumonia vaccinations.  Follow up in 1  year.  Medication List:   Allergies as of 04/20/2022       Reactions   Coreg [carvedilol] Shortness Of Breath   Levothyroxine Itching   Patient thinks it was the dye in the tablets she was allergic to, she feels like "something is crawling under my skin"   Lopressor [metoprolol Tartrate] Cough   Fatigue   Metformin And Related Diarrhea   Norvasc [amlodipine Besylate] Palpitations   "made my heart race"        Medication List        Accurate as of April 20, 2022  2:01 PM. If you have any questions, ask your nurse or doctor.          Accu-Chek FastClix Lancets Misc   Accu-Chek Guide test strip Generic drug: glucose blood   Aspirin Adult Low Strength 81 MG tablet Generic drug: aspirin EC TAKE 1 TABLET BY MOUTH ONCE DAILY   bisoprolol 5 MG tablet Commonly known as: ZEBETA Take 1/2 tablet (2.5 mg total) by mouth daily.   clobetasol 0.05 % external solution Commonly known as: TEMOVATE Apply 1 Application topically 2 (two) times daily as needed (dry scalp).   clopidogrel 75 MG tablet Commonly known as: PLAVIX Take 1 tablet (75 mg total) by mouth daily.   doxepin 10 MG capsule Commonly known as: SINEQUAN 1 capsule at bedtime Orally prn   ezetimibe 10 MG tablet Commonly known as: ZETIA Take 1 tablet (10 mg total) by mouth daily.   fexofenadine 180 MG tablet Commonly known as: ALLEGRA Take 180 mg by mouth 2 (two) times daily.   fluticasone 50 MCG/ACT nasal spray Commonly known as: FLONASE Place 1 spray into both nostrils daily as needed for allergies or rhinitis.   glimepiride 1 MG tablet Commonly known as: AMARYL Take 1 tablet (1 mg total) by mouth daily with breakfast or the first meal of the day   hydrOXYzine 25 MG tablet Commonly known as: ATARAX Take 1 tablet (25 mg total) by mouth daily as needed for itching.   irbesartan-hydrochlorothiazide 150-12.5 MG tablet Commonly known as: AVALIDE Take 1 tablet by mouth daily.   Jardiance 25 MG Tabs  tablet Generic drug: empagliflozin Take 1 tablet (25 mg total) by mouth in the morning.   levothyroxine 75 MCG tablet Commonly known as: SYNTHROID Take 1 tablet (75 mcg total) by mouth in the morning on an empty stomach   montelukast 10 MG tablet Commonly known as: SINGULAIR Take 1 tablet (10 mg total) by mouth at bedtime.   potassium chloride 10 MEQ tablet Commonly known as: KLOR-CON M Take 1 tablet (10 mEq total) by mouth daily.   rosuvastatin 40 MG tablet Commonly known as: CRESTOR Take 1 tablet (40 mg total) by mouth at bedtime for cholesterol        Signature:  Coralyn Helling, MD [facility] Pager - [phone] 04/20/2022, 2:01 PM

## 2022-04-22 ENCOUNTER — Other Ambulatory Visit (HOSPITAL_COMMUNITY): Payer: Self-pay

## 2022-04-22 ENCOUNTER — Other Ambulatory Visit: Payer: Self-pay | Admitting: Cardiovascular Disease

## 2022-04-22 ENCOUNTER — Other Ambulatory Visit: Payer: Self-pay

## 2022-04-22 MED ORDER — IRBESARTAN-HYDROCHLOROTHIAZIDE 150-12.5 MG PO TABS
1.0000 | ORAL_TABLET | Freq: Every day | ORAL | 6 refills | Status: DC
  Filled 2022-04-22: qty 30, 30d supply, fill #0
  Filled 2022-05-26: qty 30, 30d supply, fill #1
  Filled 2022-06-24: qty 30, 30d supply, fill #2
  Filled 2022-07-29: qty 30, 30d supply, fill #3
  Filled 2022-08-26: qty 30, 30d supply, fill #4
  Filled 2022-09-30: qty 30, 30d supply, fill #5
  Filled 2022-11-03: qty 30, 30d supply, fill #6

## 2022-04-24 ENCOUNTER — Other Ambulatory Visit (HOSPITAL_COMMUNITY): Payer: Self-pay

## 2022-04-24 ENCOUNTER — Other Ambulatory Visit: Payer: Self-pay

## 2022-05-07 ENCOUNTER — Other Ambulatory Visit (HOSPITAL_COMMUNITY): Payer: Self-pay

## 2022-05-13 ENCOUNTER — Other Ambulatory Visit: Payer: Self-pay | Admitting: Cardiovascular Disease

## 2022-05-14 MED ORDER — POTASSIUM CHLORIDE CRYS ER 10 MEQ PO TBCR: 10.0000 meq | EXTENDED_RELEASE_TABLET | Freq: Every day | ORAL | 3 refills | Status: DC

## 2022-05-14 MED ORDER — CLOPIDOGREL BISULFATE 75 MG PO TABS: 75.0000 mg | ORAL_TABLET | Freq: Every day | ORAL | 3 refills | Status: DC

## 2022-05-15 ENCOUNTER — Telehealth: Payer: Self-pay | Admitting: Cardiovascular Disease

## 2022-05-15 ENCOUNTER — Other Ambulatory Visit: Payer: Self-pay | Admitting: Pulmonary Disease

## 2022-05-15 ENCOUNTER — Other Ambulatory Visit (HOSPITAL_COMMUNITY): Payer: Self-pay

## 2022-05-15 MED ORDER — MONTELUKAST SODIUM 10 MG PO TABS
10.0000 mg | ORAL_TABLET | Freq: Every day | ORAL | 0 refills | Status: DC
  Filled 2022-05-15: qty 90, 90d supply, fill #0

## 2022-05-15 MED ORDER — POTASSIUM CHLORIDE CRYS ER 10 MEQ PO TBCR
10.0000 meq | EXTENDED_RELEASE_TABLET | Freq: Every day | ORAL | 3 refills | Status: DC
  Filled 2022-05-15 – 2022-08-17 (×2): qty 90, 90d supply, fill #0
  Filled 2022-11-24: qty 90, 90d supply, fill #1
  Filled 2023-02-24: qty 90, 90d supply, fill #2

## 2022-05-15 MED ORDER — CLOPIDOGREL BISULFATE 75 MG PO TABS
75.0000 mg | ORAL_TABLET | Freq: Every day | ORAL | 3 refills | Status: DC
  Filled 2022-05-15 – 2022-08-17 (×2): qty 90, 90d supply, fill #0
  Filled 2022-11-17: qty 90, 90d supply, fill #1
  Filled 2023-02-16: qty 90, 90d supply, fill #2

## 2022-05-15 NOTE — Telephone Encounter (Signed)
*  STAT* If patient is at the pharmacy, call can be transferred to refill team.   1. Which medications need to be refilled? (please list name of each medication and dose if known) clopidogrel (PLAVIX) 75 MG tablet   potassium chloride (KLOR-CON M) 10 MEQ tablet   2. Which pharmacy/location (including street and city if local pharmacy) is medication to be sent to? [place] [facility]   3. Do they need a 30 day or 90 day supply? 90

## 2022-05-18 ENCOUNTER — Other Ambulatory Visit (HOSPITAL_COMMUNITY): Payer: Self-pay

## 2022-06-29 ENCOUNTER — Other Ambulatory Visit (HOSPITAL_COMMUNITY): Payer: Self-pay

## 2022-08-05 ENCOUNTER — Other Ambulatory Visit: Payer: Self-pay

## 2022-08-05 ENCOUNTER — Other Ambulatory Visit (HOSPITAL_COMMUNITY): Payer: Self-pay

## 2022-08-05 MED ORDER — GLIMEPIRIDE 1 MG PO TABS
1.0000 mg | ORAL_TABLET | Freq: Every day | ORAL | 4 refills | Status: DC
  Filled 2022-08-05: qty 90, 90d supply, fill #0
  Filled 2022-11-03: qty 90, 90d supply, fill #1

## 2022-08-17 ENCOUNTER — Other Ambulatory Visit (HOSPITAL_COMMUNITY): Payer: Self-pay

## 2022-08-17 ENCOUNTER — Other Ambulatory Visit: Payer: Self-pay | Admitting: Pulmonary Disease

## 2022-08-17 ENCOUNTER — Other Ambulatory Visit: Payer: Self-pay

## 2022-08-20 ENCOUNTER — Other Ambulatory Visit (HOSPITAL_COMMUNITY): Payer: Self-pay

## 2022-08-20 ENCOUNTER — Telehealth: Payer: Self-pay | Admitting: Pulmonary Disease

## 2022-08-20 MED ORDER — MONTELUKAST SODIUM 10 MG PO TABS
10.0000 mg | ORAL_TABLET | Freq: Every day | ORAL | 1 refills | Status: AC
  Filled 2022-08-20: qty 90, 90d supply, fill #0
  Filled 2022-11-17: qty 90, 90d supply, fill #1

## 2022-08-20 NOTE — Telephone Encounter (Signed)
Patient states needs refill for Singulair. Pharmacy is [facility]. Patient is out of the medication. Patient phone number is [phone].

## 2022-08-20 NOTE — Telephone Encounter (Signed)
I have refilled the singulair  Pt aware  Nothing further needed

## 2022-09-03 ENCOUNTER — Telehealth: Payer: Self-pay | Admitting: Cardiovascular Disease

## 2022-09-03 NOTE — Telephone Encounter (Signed)
Spoke with DOD Dr. Servando Salina and the call was forwarded to her.

## 2022-09-03 NOTE — Telephone Encounter (Signed)
Calling with a EKG report finding. Please advise

## 2022-09-04 NOTE — Telephone Encounter (Signed)
Dr Mallory Shirk comment  A call was connected to me with a physician who reported that he was seeing this patient in a research trial, he was concerned about her EKG which show anterolateral T wave inversion.  I was able to review her EKG going back to 2019 which showed same findings.  During the time of our conversation he noted that the patient was completely asymptomatic.  He will fax the EKG to our office for my review as well.

## 2022-09-08 ENCOUNTER — Other Ambulatory Visit (HOSPITAL_COMMUNITY): Payer: Self-pay

## 2022-09-08 MED ORDER — LEVOTHYROXINE SODIUM 75 MCG PO TABS
75.0000 ug | ORAL_TABLET | Freq: Every morning | ORAL | 4 refills | Status: DC
  Filled 2022-09-08: qty 30, 30d supply, fill #0
  Filled 2022-10-06: qty 30, 30d supply, fill #1
  Filled 2022-11-03: qty 30, 30d supply, fill #2
  Filled 2022-12-01: qty 30, 30d supply, fill #3
  Filled 2023-01-07: qty 30, 30d supply, fill #4

## 2022-10-06 ENCOUNTER — Other Ambulatory Visit (HOSPITAL_COMMUNITY): Payer: Self-pay

## 2022-11-03 ENCOUNTER — Other Ambulatory Visit (HOSPITAL_COMMUNITY): Payer: Self-pay

## 2022-11-04 ENCOUNTER — Other Ambulatory Visit (HOSPITAL_COMMUNITY): Payer: Self-pay

## 2022-11-04 ENCOUNTER — Encounter (HOSPITAL_COMMUNITY): Payer: Self-pay

## 2022-11-04 MED ORDER — ROSUVASTATIN CALCIUM 40 MG PO TABS
40.0000 mg | ORAL_TABLET | Freq: Every day | ORAL | 3 refills | Status: DC
  Filled 2022-11-04: qty 90, 90d supply, fill #0
  Filled 2023-02-09: qty 90, 90d supply, fill #1
  Filled 2023-05-18: qty 90, 90d supply, fill #2
  Filled 2023-08-17: qty 90, 90d supply, fill #3

## 2022-11-18 ENCOUNTER — Other Ambulatory Visit (HOSPITAL_COMMUNITY): Payer: Self-pay

## 2022-12-01 ENCOUNTER — Other Ambulatory Visit: Payer: Self-pay

## 2022-12-01 ENCOUNTER — Other Ambulatory Visit (HOSPITAL_COMMUNITY): Payer: Self-pay

## 2022-12-01 ENCOUNTER — Other Ambulatory Visit: Payer: Self-pay | Admitting: Cardiovascular Disease

## 2022-12-01 MED ORDER — IRBESARTAN-HYDROCHLOROTHIAZIDE 150-12.5 MG PO TABS
1.0000 | ORAL_TABLET | Freq: Every day | ORAL | 0 refills | Status: DC
  Filled 2022-12-01: qty 90, 90d supply, fill #0

## 2022-12-21 ENCOUNTER — Other Ambulatory Visit (HOSPITAL_COMMUNITY): Payer: Self-pay

## 2022-12-21 MED ORDER — TRADJENTA 5 MG PO TABS
5.0000 mg | ORAL_TABLET | Freq: Every day | ORAL | 5 refills | Status: AC
  Filled 2022-12-21: qty 30, 30d supply, fill #0
  Filled 2023-01-18: qty 30, 30d supply, fill #1
  Filled 2023-02-16: qty 30, 30d supply, fill #2
  Filled 2023-03-23: qty 30, 30d supply, fill #3
  Filled 2023-04-20: qty 30, 30d supply, fill #4
  Filled 2023-05-18: qty 30, 30d supply, fill #5

## 2023-01-07 ENCOUNTER — Other Ambulatory Visit (HOSPITAL_COMMUNITY): Payer: Self-pay

## 2023-01-07 MED ORDER — JARDIANCE 25 MG PO TABS
25.0000 mg | ORAL_TABLET | Freq: Every morning | ORAL | 5 refills | Status: AC
  Filled 2023-01-07: qty 90, 90d supply, fill #0
  Filled 2023-04-13: qty 90, 90d supply, fill #1

## 2023-01-19 ENCOUNTER — Other Ambulatory Visit (HOSPITAL_COMMUNITY): Payer: Self-pay

## 2023-01-19 ENCOUNTER — Encounter: Payer: Self-pay | Admitting: Cardiovascular Disease

## 2023-01-19 ENCOUNTER — Ambulatory Visit: Payer: Medicare Other | Attending: Cardiovascular Disease | Admitting: Cardiovascular Disease

## 2023-01-19 VITALS — BP 132/84 | HR 70 | Ht 65.5 in | Wt 181.8 lb

## 2023-01-19 DIAGNOSIS — E119 Type 2 diabetes mellitus without complications: Secondary | ICD-10-CM

## 2023-01-19 DIAGNOSIS — I10 Essential (primary) hypertension: Secondary | ICD-10-CM

## 2023-01-19 DIAGNOSIS — I2510 Atherosclerotic heart disease of native coronary artery without angina pectoris: Secondary | ICD-10-CM

## 2023-01-19 DIAGNOSIS — E785 Hyperlipidemia, unspecified: Secondary | ICD-10-CM | POA: Diagnosis not present

## 2023-01-19 NOTE — Progress Notes (Signed)
 Cardiology office note    Date:  01/20/2023   ID:  Shelley [name], DOB 12-26-1955, MRN [id number]  PCP:  Vick Lurie, FNP  Cardiologist:  Aurea Aronov Electrophysiologist:  None   Evaluation Performed:  Follow-Up Visit  Chief Complaint:  CAD  History of Present Illness:    Shelley [name] is a 68 y.o. female with premature onset coronary artery disease, diabetes mellitus, hypertension and obesity.  Shelley [name] is doing really well from a cardiovascular point of view.  She continues to bowl competitively 3 days a week every week and has no trouble with shortness of breath or chest pain.  She also denies edema, claudication, focal neurological complaints, palpitations or syncope.  She remains overweight but has managed to keep her BMI less than 30.  Metabolic control is good with her most recent hemoglobin A1c 7.3%, fasting glucose 104, cholesterol 125, triglycerides 87, HDL 37 LDL 71.  She is working on losing some more weight and restricting carbohydrates to get her A1c and HDL up in target range.  Her last revascularization was in July 2017 for a non-STEMI when she received a drug-eluting stent to the RCA.  She has not had any coronary or other vascular events since then.  Her endocrinologist is Dr. Tommas.   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 She has a rich history of coronary disease. In 2001, when she was only 68 years old she received a bare-metal stent to the mid LAD artery. In 2008 she presented with an acute myocardial infarction and received a 3 x 24 mm drug-eluting Taxus stent to the LAD artery. Subsequently underwent staged revascularization with a drug-eluting Cypher 2.5 x 13 mm to the distal LAD artery and a 3.0 x 23 mm drug-eluting Cypher stent to the right coronary artery. July 31 2014 received drug-eluting stent to the distal RCA PROMUS PREM MR 3.0X16 for STEMI. She has normal left ventricular systolic function. She has never had  symptoms of congestive heart failure.  She had pruritus that she associated with taking long-acting diltiazem  resolved once we switched to immediate release diltiazem .      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    Diabetes mellitus    Dyslipidemia    Hypertension    MI (myocardial infarction) (HCC)    Shingles    Thyroid  disease    Past Surgical History:  Procedure Laterality Date   BACK SURGERY  5/99 & 6/99   BIOPSY  12/08/2021   Procedure: BIOPSY;  Surgeon: Cindie Carlin POUR, DO;  Location: [facility];  Service: Endoscopy;;   CARDIAC CATHETERIZATION  01/12/2002   stent patent   CARDIAC CATHETERIZATION  09/03/2006   patent stents   CARDIAC CATHETERIZATION N/A 07/31/2015   Procedure: Left Heart Cath and Coronary Angiography;  Surgeon: Lonni JONETTA Cash, MD;  Location[facility] INVASIVE CV LAB;  Service: Cardiovascular;  Laterality: N/A;   CARDIAC CATHETERIZATION N/A 07/31/2015   Procedure: Coronary Stent Intervention;  Surgeon: Lonni JONETTA Cash, MD;  Location: [facility] INVASIVE CV LAB;  Service: Cardiovascular;  Laterality: N/A;   CARDIAC CATHETERIZATION N/A 08/02/2015   Procedure: Coronary Stent Intervention;  Surgeon: Alm LELON Clay, MD;  Location: [facility] INVASIVE CV LAB;  Service: Cardiovascular;  Laterality: N/A;   COLONOSCOPY WITH PROPOFOL  N/A 12/08/2021   Procedure: COLONOSCOPY WITH PROPOFOL ;  Surgeon: Cindie Carlin  K, DO;  Location: [facility];  Service: Endoscopy;  Laterality: N/A;  2:30pm, asa 3,pt knows to arrive at 9:45   CORONARY ANGIOPLASTY WITH STENT PLACEMENT  07/17/1998   stent LAD   CORONARY ANGIOPLASTY WITH STENT PLACEMENT  01/07/2006   stent RCA,LAD stent patent   CORONARY ANGIOPLASTY WITH STENT PLACEMENT  03/18/2006   stent to mid RCA,prox.RCA and LAD stent patent   CRYOTHERAPY     ESOPHAGOGASTRODUODENOSCOPY  2014   Gastritis   HEMOSTASIS CLIP PLACEMENT   12/08/2021   Procedure: HEMOSTASIS CLIP PLACEMENT;  Surgeon: Cindie Carlin POUR, DO;  Location: [facility];  Service: Endoscopy;;   POLYPECTOMY  12/08/2021   Procedure: POLYPECTOMY;  Surgeon: Cindie Carlin POUR, DO;  Location: [facility];  Service: Endoscopy;;   THYROID  SURGERY     PT. DENIES/   just radiation RADIATION   TOTAL HIP ARTHROPLASTY Right 12/21/2017   Procedure: TOTAL HIP ARTHROPLASTY ANTERIOR APPROACH;  Surgeon: Ernie Cough, MD;  Location: [facility];  Service: Orthopedics;  Laterality: Right;  70 mins   TUBAL LIGATION       Current Meds  Medication Sig   ACCU-CHEK FASTCLIX LANCETS MISC    ACCU-CHEK GUIDE test strip    ASPIRIN  ADULT LOW STRENGTH 81 MG EC tablet TAKE 1 TABLET BY MOUTH ONCE DAILY   Biotin 10 MG TABS Take 10 mg by mouth daily at 6 (six) AM.   bisoprolol  (ZEBETA ) 5 MG tablet Take 1/2 tablet (2.5 mg total) by mouth daily.   clobetasol (TEMOVATE) 0.05 % external solution Apply 1 Application topically 2 (two) times daily as needed (dry scalp).   clopidogrel  (PLAVIX ) 75 MG tablet Take 1 tablet (75 mg total) by mouth daily.   doxepin  (SINEQUAN ) 10 MG capsule 1 capsule at bedtime Orally prn   empagliflozin  (JARDIANCE ) 25 MG TABS tablet Take 1 tablet (25 mg total) by mouth every morning.   ezetimibe  (ZETIA ) 10 MG tablet Take 1 tablet (10 mg total) by mouth daily.   fexofenadine (ALLEGRA) 180 MG tablet Take 180 mg by mouth 2 (two) times daily.   fluticasone  (FLONASE ) 50 MCG/ACT nasal spray Place 1 spray into both nostrils daily as needed for allergies or rhinitis.   hydrOXYzine  (ATARAX ) 25 MG tablet Take 1 tablet (25 mg total) by mouth daily as needed for itching.   irbesartan -hydrochlorothiazide  (AVALIDE) 150-12.5 MG tablet Take 1 tablet by mouth daily.   levothyroxine  (SYNTHROID ) 75 MCG tablet Take 1 tablet (75 mcg total) by mouth in the morning on an empty stomach   linagliptin  (TRADJENTA ) 5 MG TABS tablet Take 1 tablet (5 mg total) by mouth daily.   montelukast   (SINGULAIR ) 10 MG tablet Take 1 tablet (10 mg total) by mouth at bedtime.   potassium chloride  (KLOR-CON  M) 10 MEQ tablet Take 1 tablet (10 mEq total) by mouth daily.   rosuvastatin  (CRESTOR ) 40 MG tablet Take 1 tablet (40 mg total) by mouth at bedtime for cholesterol   rosuvastatin  (CRESTOR ) 40 MG tablet Take 1 tablet (40 mg total) by mouth daily for cholesterol.     Allergies:   Coreg  [carvedilol ], Levothyroxine , Levothyroxine  sodium, Sitagliptin, Lopressor  [metoprolol  tartrate], Metformin and related, and Norvasc [amlodipine besylate]   Social History   Tobacco Use   Smoking status: Former    Current packs/day: 0.00    Average packs/day: 0.5 packs/day for 34.0 years (17.0 ttl pk-yrs)    Types: Cigarettes    Start date: 60    Quit date: 2008    Years  since quitting: 17.0   Smokeless tobacco: Former    Quit date: 01/05/2005  Vaping Use   Vaping status: Never Used  Substance Use Topics   Alcohol use: No    Alcohol/week: 0.0 standard drinks of alcohol   Drug use: No     Family Hx: The patient's family history includes Esophageal cancer in her maternal aunt; Hypertension in her mother; Prostate cancer in her maternal uncle; Thyroid  disease in her mother. There is no history of Colon cancer or Colon polyps.  ROS:   Please see the history of present illness.   All other systems are reviewed and are negative.   Prior CV studies:   The following studies were reviewed today:  Labs/Other Tests and Data Reviewed:    EKG: ECG was ordered today shows normal sinus rhythm.  T wave inversions are seen in old inferior leads as well as the anterior leads V2-V6, but are unchanged from 2019.  The QTC is normal at 430 ms.  Recent Labs: No results found for requested labs within last 365 days.  05/09/2019 Creatinine 0.97, normal liver function tests, TSH 1.52 Recent Lipid Panel Lab Results  Component Value Date/Time   CHOL 209 (H) 08/03/2015 04:50 AM   TRIG 105 08/03/2015 04:50 AM   HDL  32 (L) 08/03/2015 04:50 AM   CHOLHDL 6.5 08/03/2015 04:50 AM   LDLCALC 156 (H) 08/03/2015 04:50 AM   07/12/2018 Chol 102, HDL 32, LDL 69, TG 88 Creatinine 1.06, K 3.7, Hgb 12.6, normal LFTs  05/09/2019 Cholesterol 120, HDL 41, LDL 54, triglycerides 145, hemoglobin A1c 7.8%  2024, Dr. Tommas hemoglobin A1c 7.3%, fasting glucose 104, cholesterol 125, triglycerides 87, HDL 37 LDL 71  Wt Readings from Last 3 Encounters:  01/19/23 181 lb 12.8 oz (82.5 kg)  04/20/22 181 lb 3.2 oz (82.2 kg)  01/15/22 182 lb 6.4 oz (82.7 kg)     Objective:    Vital Signs:  BP 132/84 (BP Location: Left Arm, Patient Position: Sitting, Cuff Size: Normal)   Pulse 70   Ht 5' 5.5 (1.664 m)   Wt 181 lb 12.8 oz (82.5 kg)   LMP 01/06/2003 (Approximate)   SpO2 93%   BMI 29.79 kg/m     General: Alert, oriented x3, no distress, appears well, overweight Head: no evidence of trauma, PERRL, EOMI, no exophtalmos or lid lag, no myxedema, no xanthelasma; normal ears, nose and oropharynx Neck: normal jugular venous pulsations and no hepatojugular reflux; brisk carotid pulses without delay and no carotid bruits Chest: clear to auscultation, no signs of consolidation by percussion or palpation, normal fremitus, symmetrical and full respiratory excursions Cardiovascular: normal position and quality of the apical impulse, regular rhythm, normal first and second heart sounds, no murmurs, rubs or gallops Abdomen: no tenderness or distention, no masses by palpation, no abnormal pulsatility or arterial bruits, normal bowel sounds, no hepatosplenomegaly Extremities: no clubbing, cyanosis or edema; 2+ radial, ulnar and brachial pulses bilaterally; 2+ right femoral, posterior tibial and dorsalis pedis pulses; 2+ left femoral, posterior tibial and dorsalis pedis pulses; no subclavian or femoral bruits Neurological: grossly nonfocal Psych: Normal mood and affect   ASSESSMENT & PLAN:    1. Coronary artery disease involving  native coronary artery of native heart without angina pectoris   2. Essential hypertension   3. Dyslipidemia (high LDL; low HDL)   4. Non-insulin  treated type 2 diabetes mellitus (HCC)      CAD: She is quite active and is asymptomatic.  Normal LV function.  She has not required revascularization in the last 7 years.  Extensive coronary disease so we have planned lifelong clopidogrel  therapy.  On beta-blocker (preferred highly selective bisoprolol  due to reactive airway disease; tolerates as long as the dose is low, did not tolerate other beta-blockers). HTN:  Very well controlled. on immediate release diltiazem  since she had pruritus with the sustained release formulation capsules. Needs to take the IR preparation at least twice daily. Feels better on calcium  channel blockers than on higher dose beta blockers. HLP:   LDL at target range on combination rosuvastatin  and ezetimibe .  Chronically low HDL. Overweight: Losing some more weight should help bring up her HDL. DM: In the past she had problems with SGLT2 inhibitors but she is currently tolerating high-dose Jardiance .  She is also on Tradjenta .  Patient Instructions  Medication Instructions:  No changes *If you need a refill on your cardiac medications before your next appointment, please call your pharmacy*  Follow-Up: At [facility], you and your health needs are our priority.  As part of our continuing mission to provide you with exceptional heart care, we have created designated Provider Care Teams.  These Care Teams include your primary Cardiologist (physician) and Advanced Practice Providers (APPs -  Physician Assistants and Nurse Practitioners) who all work together to provide you with the care you need, when you need it.  We recommend signing up for the patient portal called MyChart.  Sign up information is provided on this After Visit Summary.  MyChart is used to connect with patients for Virtual Visits (Telemedicine).   Patients are able to view lab/test results, encounter notes, upcoming appointments, etc.  Non-urgent messages can be sent to your provider as well.   To learn more about what you can do with MyChart, go to [email or web address].    Your next appointment:   1 year(s)  Provider:   Jerel Balding, MD         Follow Up:  In Person  1 year  Signed, Jerel Balding, MD  01/20/2023 4:55 PM    [facility]

## 2023-01-19 NOTE — Patient Instructions (Signed)

## 2023-02-03 ENCOUNTER — Other Ambulatory Visit (HOSPITAL_COMMUNITY): Payer: Self-pay

## 2023-02-03 MED ORDER — LEVOTHYROXINE SODIUM 75 MCG PO TABS
75.0000 ug | ORAL_TABLET | Freq: Every morning | ORAL | 4 refills | Status: DC
  Filled 2023-02-03: qty 30, 30d supply, fill #0
  Filled 2023-03-02: qty 30, 30d supply, fill #1
  Filled 2023-03-30 – 2023-03-31 (×2): qty 30, 30d supply, fill #2
  Filled 2023-05-05: qty 30, 30d supply, fill #3
  Filled 2023-06-01: qty 30, 30d supply, fill #4

## 2023-02-10 ENCOUNTER — Other Ambulatory Visit (HOSPITAL_COMMUNITY): Payer: Self-pay

## 2023-02-16 ENCOUNTER — Other Ambulatory Visit: Payer: Self-pay

## 2023-02-16 ENCOUNTER — Other Ambulatory Visit (HOSPITAL_COMMUNITY): Payer: Self-pay

## 2023-02-16 ENCOUNTER — Other Ambulatory Visit: Payer: Self-pay | Admitting: Cardiovascular Disease

## 2023-02-16 MED ORDER — BISOPROLOL FUMARATE 5 MG PO TABS
2.5000 mg | ORAL_TABLET | Freq: Every day | ORAL | 3 refills | Status: AC
  Filled 2023-02-16: qty 45, 90d supply, fill #0
  Filled 2023-05-18: qty 45, 90d supply, fill #1
  Filled 2023-08-17: qty 45, 90d supply, fill #2
  Filled 2023-11-23: qty 45, 90d supply, fill #3

## 2023-02-17 ENCOUNTER — Other Ambulatory Visit (HOSPITAL_COMMUNITY): Payer: Self-pay

## 2023-02-25 ENCOUNTER — Other Ambulatory Visit (HOSPITAL_COMMUNITY): Payer: Self-pay

## 2023-02-26 ENCOUNTER — Other Ambulatory Visit (HOSPITAL_COMMUNITY): Payer: Self-pay

## 2023-03-02 ENCOUNTER — Other Ambulatory Visit: Payer: Self-pay | Admitting: Cardiovascular Disease

## 2023-03-02 ENCOUNTER — Other Ambulatory Visit (HOSPITAL_COMMUNITY): Payer: Self-pay

## 2023-03-02 MED ORDER — IRBESARTAN-HYDROCHLOROTHIAZIDE 150-12.5 MG PO TABS
1.0000 | ORAL_TABLET | Freq: Every day | ORAL | 3 refills | Status: AC
  Filled 2023-03-02: qty 90, 90d supply, fill #0
  Filled 2023-06-08: qty 90, 90d supply, fill #1
  Filled 2023-09-08: qty 90, 90d supply, fill #2
  Filled 2023-12-14: qty 90, 90d supply, fill #3

## 2023-03-02 MED ORDER — EZETIMIBE 10 MG PO TABS
10.0000 mg | ORAL_TABLET | Freq: Every day | ORAL | 3 refills | Status: AC
  Filled 2023-03-02: qty 90, 90d supply, fill #0
  Filled 2023-06-01: qty 90, 90d supply, fill #1
  Filled 2023-09-08: qty 90, 90d supply, fill #2
  Filled 2023-12-14: qty 90, 90d supply, fill #3

## 2023-03-03 ENCOUNTER — Other Ambulatory Visit (HOSPITAL_COMMUNITY): Payer: Self-pay

## 2023-03-30 ENCOUNTER — Other Ambulatory Visit (HOSPITAL_COMMUNITY): Payer: Self-pay

## 2023-04-13 ENCOUNTER — Other Ambulatory Visit (HOSPITAL_COMMUNITY): Payer: Self-pay

## 2023-04-14 ENCOUNTER — Other Ambulatory Visit (HOSPITAL_COMMUNITY): Payer: Self-pay

## 2023-04-30 ENCOUNTER — Other Ambulatory Visit (HOSPITAL_COMMUNITY): Payer: Self-pay

## 2023-04-30 MED ORDER — MOUNJARO 2.5 MG/0.5ML ~~LOC~~ SOAJ
2.5000 mg | SUBCUTANEOUS | 0 refills | Status: DC
Start: 2023-04-30 — End: 2023-05-25
  Filled 2023-04-30: qty 2, 28d supply, fill #0

## 2023-05-03 ENCOUNTER — Other Ambulatory Visit (HOSPITAL_COMMUNITY): Payer: Self-pay

## 2023-05-18 ENCOUNTER — Other Ambulatory Visit: Payer: Self-pay

## 2023-05-25 ENCOUNTER — Other Ambulatory Visit (HOSPITAL_COMMUNITY): Payer: Self-pay

## 2023-05-25 ENCOUNTER — Other Ambulatory Visit: Payer: Self-pay | Admitting: Nurse Practitioner

## 2023-05-25 ENCOUNTER — Encounter (HOSPITAL_COMMUNITY): Payer: Self-pay

## 2023-05-25 MED ORDER — MOUNJARO 2.5 MG/0.5ML ~~LOC~~ SOAJ
2.5000 mg | SUBCUTANEOUS | 5 refills | Status: DC
Start: 2023-05-25 — End: 2023-11-10
  Filled 2023-05-25: qty 2, 28d supply, fill #0
  Filled 2023-06-23: qty 2, 28d supply, fill #1
  Filled 2023-07-20: qty 2, 28d supply, fill #2
  Filled 2023-08-17: qty 2, 28d supply, fill #3
  Filled 2023-09-14: qty 2, 28d supply, fill #4
  Filled 2023-10-12: qty 2, 28d supply, fill #5

## 2023-05-26 ENCOUNTER — Other Ambulatory Visit (HOSPITAL_COMMUNITY): Payer: Self-pay

## 2023-05-26 ENCOUNTER — Other Ambulatory Visit: Payer: Self-pay

## 2023-05-26 MED ORDER — POTASSIUM CHLORIDE CRYS ER 10 MEQ PO TBCR
10.0000 meq | EXTENDED_RELEASE_TABLET | Freq: Every day | ORAL | 2 refills | Status: AC
  Filled 2023-05-26: qty 90, 90d supply, fill #0
  Filled 2023-08-24: qty 90, 90d supply, fill #1
  Filled 2023-11-23: qty 90, 90d supply, fill #2

## 2023-05-26 MED ORDER — CLOPIDOGREL BISULFATE 75 MG PO TABS
75.0000 mg | ORAL_TABLET | Freq: Every day | ORAL | 2 refills | Status: AC
  Filled 2023-05-26: qty 90, 90d supply, fill #0
  Filled 2023-08-24: qty 90, 90d supply, fill #1
  Filled 2023-11-23: qty 90, 90d supply, fill #2

## 2023-06-02 ENCOUNTER — Other Ambulatory Visit: Payer: Self-pay

## 2023-06-02 ENCOUNTER — Other Ambulatory Visit (HOSPITAL_COMMUNITY): Payer: Self-pay

## 2023-06-03 ENCOUNTER — Other Ambulatory Visit: Payer: Self-pay

## 2023-06-03 ENCOUNTER — Other Ambulatory Visit (HOSPITAL_COMMUNITY): Payer: Self-pay

## 2023-06-23 ENCOUNTER — Other Ambulatory Visit (HOSPITAL_COMMUNITY): Payer: Self-pay

## 2023-06-24 ENCOUNTER — Other Ambulatory Visit (HOSPITAL_COMMUNITY): Payer: Self-pay

## 2023-06-24 ENCOUNTER — Encounter (HOSPITAL_COMMUNITY): Payer: Self-pay

## 2023-06-29 ENCOUNTER — Other Ambulatory Visit (HOSPITAL_COMMUNITY): Payer: Self-pay

## 2023-06-29 MED ORDER — LEVOTHYROXINE SODIUM 75 MCG PO TABS
75.0000 ug | ORAL_TABLET | Freq: Every morning | ORAL | 11 refills | Status: AC
  Filled 2023-06-29: qty 30, 30d supply, fill #0
  Filled 2023-08-02: qty 30, 30d supply, fill #1
  Filled 2023-09-01: qty 30, 30d supply, fill #2
  Filled 2023-09-30: qty 30, 30d supply, fill #3
  Filled 2023-10-27: qty 30, 30d supply, fill #4
  Filled 2023-11-23 – 2023-11-24 (×2): qty 30, 30d supply, fill #5
  Filled 2024-01-04: qty 30, 30d supply, fill #6
  Filled 2024-02-01: qty 30, 30d supply, fill #7

## 2023-10-13 ENCOUNTER — Other Ambulatory Visit (HOSPITAL_COMMUNITY): Payer: Self-pay

## 2023-10-13 MED ORDER — FLUZONE HIGH-DOSE 0.5 ML IM SUSY
0.5000 mL | PREFILLED_SYRINGE | Freq: Once | INTRAMUSCULAR | 0 refills | Status: AC
Start: 2023-10-13 — End: 2023-10-14
  Filled 2023-10-13: qty 0.5, 1d supply, fill #0

## 2023-11-04 ENCOUNTER — Other Ambulatory Visit (HOSPITAL_COMMUNITY): Payer: Self-pay

## 2023-11-04 MED ORDER — ROSUVASTATIN CALCIUM 40 MG PO TABS
40.0000 mg | ORAL_TABLET | Freq: Every day | ORAL | 3 refills | Status: AC
  Filled 2023-11-04: qty 90, 90d supply, fill #0

## 2023-11-08 ENCOUNTER — Other Ambulatory Visit (HOSPITAL_COMMUNITY): Payer: Self-pay | Admitting: Adult Health

## 2023-11-08 DIAGNOSIS — Z13820 Encounter for screening for osteoporosis: Secondary | ICD-10-CM

## 2023-11-08 DIAGNOSIS — Z1231 Encounter for screening mammogram for malignant neoplasm of breast: Secondary | ICD-10-CM

## 2023-11-09 ENCOUNTER — Other Ambulatory Visit (HOSPITAL_COMMUNITY): Payer: Self-pay

## 2023-11-10 ENCOUNTER — Other Ambulatory Visit (HOSPITAL_COMMUNITY): Payer: Self-pay

## 2023-11-10 MED ORDER — MOUNJARO 2.5 MG/0.5ML ~~LOC~~ SOAJ
2.5000 mg | SUBCUTANEOUS | 5 refills | Status: AC
  Filled 2023-11-10: qty 2, 28d supply, fill #0
  Filled 2023-12-07: qty 2, 28d supply, fill #1
  Filled 2024-01-04: qty 2, 28d supply, fill #2
  Filled 2024-02-01: qty 2, 28d supply, fill #3

## 2023-11-23 ENCOUNTER — Other Ambulatory Visit (HOSPITAL_COMMUNITY): Payer: Self-pay

## 2023-11-24 ENCOUNTER — Other Ambulatory Visit: Payer: Self-pay

## 2023-11-25 ENCOUNTER — Ambulatory Visit (HOSPITAL_COMMUNITY)

## 2023-11-25 ENCOUNTER — Other Ambulatory Visit (HOSPITAL_COMMUNITY)

## 2023-12-13 ENCOUNTER — Ambulatory Visit (HOSPITAL_COMMUNITY)
Admission: RE | Admit: 2023-12-13 | Discharge: 2023-12-13 | Disposition: A | Source: Ambulatory Visit | Attending: Adult Health

## 2023-12-13 DIAGNOSIS — Z1231 Encounter for screening mammogram for malignant neoplasm of breast: Secondary | ICD-10-CM

## 2023-12-13 DIAGNOSIS — Z13820 Encounter for screening for osteoporosis: Secondary | ICD-10-CM

## 2023-12-16 ENCOUNTER — Encounter: Admitting: Obstetrics & Gynecology

## 2024-01-04 ENCOUNTER — Other Ambulatory Visit (HOSPITAL_COMMUNITY): Payer: Self-pay

## 2024-01-04 ENCOUNTER — Other Ambulatory Visit: Payer: Self-pay

## 2024-01-05 ENCOUNTER — Other Ambulatory Visit (HOSPITAL_COMMUNITY): Payer: Self-pay

## 2024-01-12 ENCOUNTER — Encounter: Payer: Self-pay | Admitting: Cardiovascular Disease

## 2024-01-31 ENCOUNTER — Encounter: Admitting: Obstetrics & Gynecology

## 2024-02-01 ENCOUNTER — Other Ambulatory Visit: Payer: Self-pay

## 2024-02-01 ENCOUNTER — Other Ambulatory Visit (HOSPITAL_COMMUNITY): Payer: Self-pay

## 2024-02-01 ENCOUNTER — Encounter (HOSPITAL_COMMUNITY): Payer: Self-pay

## 2024-02-01 MED ORDER — ROSUVASTATIN CALCIUM 40 MG PO TABS
20.0000 mg | ORAL_TABLET | Freq: Every day | ORAL | 3 refills | Status: AC
  Filled 2024-02-01: qty 45, 90d supply, fill #0

## 2024-02-02 ENCOUNTER — Other Ambulatory Visit (HOSPITAL_COMMUNITY): Payer: Self-pay

## 2024-02-10 ENCOUNTER — Other Ambulatory Visit (HOSPITAL_COMMUNITY): Payer: Self-pay

## 2024-02-10 MED ORDER — TRIAMCINOLONE ACETONIDE 0.1 % EX CREA
TOPICAL_CREAM | Freq: Two times a day (BID) | CUTANEOUS | 2 refills | Status: AC
  Filled 2024-02-10: qty 15, 30d supply, fill #0

## 2024-02-22 ENCOUNTER — Encounter: Admitting: Obstetrics & Gynecology

## 2024-04-03 ENCOUNTER — Ambulatory Visit: Admitting: Cardiovascular Disease
